# Patient Record
Sex: Female | Born: 1935 | Race: White | Hispanic: No | Smoking: Never smoker
Health system: Southern US, Community
[De-identification: ages and names within clinical notes are randomized; demographics above are authoritative.]

## PROBLEM LIST (undated history)

## (undated) DIAGNOSIS — E7800 Pure hypercholesterolemia, unspecified: Secondary | ICD-10-CM

## (undated) DIAGNOSIS — K219 Gastro-esophageal reflux disease without esophagitis: Secondary | ICD-10-CM

## (undated) DIAGNOSIS — F419 Anxiety disorder, unspecified: Secondary | ICD-10-CM

## (undated) DIAGNOSIS — J300 Vasomotor rhinitis: Secondary | ICD-10-CM

## (undated) DIAGNOSIS — C4491 Basal cell carcinoma of skin, unspecified: Secondary | ICD-10-CM

## (undated) DIAGNOSIS — N10 Acute tubulo-interstitial nephritis: Secondary | ICD-10-CM

## (undated) DIAGNOSIS — K759 Inflammatory liver disease, unspecified: Secondary | ICD-10-CM

## (undated) DIAGNOSIS — H9190 Unspecified hearing loss, unspecified ear: Secondary | ICD-10-CM

## (undated) DIAGNOSIS — D649 Anemia, unspecified: Secondary | ICD-10-CM

## (undated) DIAGNOSIS — F329 Major depressive disorder, single episode, unspecified: Secondary | ICD-10-CM

## (undated) DIAGNOSIS — M1990 Unspecified osteoarthritis, unspecified site: Secondary | ICD-10-CM

## (undated) DIAGNOSIS — M810 Age-related osteoporosis without current pathological fracture: Secondary | ICD-10-CM

## (undated) DIAGNOSIS — F32A Depression, unspecified: Secondary | ICD-10-CM

## (undated) DIAGNOSIS — Z87442 Personal history of urinary calculi: Secondary | ICD-10-CM

## (undated) HISTORY — PX: DILATION AND CURETTAGE OF UTERUS: SHX78

## (undated) HISTORY — PX: CATARACT EXTRACTION, BILATERAL: SHX1313

## (undated) HISTORY — DX: Pure hypercholesterolemia, unspecified: E78.00

## (undated) HISTORY — PX: RETINAL DETACHMENT SURGERY: SHX105

## (undated) HISTORY — PX: COLONOSCOPY: SHX174

## (undated) HISTORY — DX: Age-related osteoporosis without current pathological fracture: M81.0

## (undated) HISTORY — DX: Anemia, unspecified: D64.9

## (undated) HISTORY — DX: Vasomotor rhinitis: J30.0

## (undated) HISTORY — DX: Basal cell carcinoma of skin, unspecified: C44.91

## (undated) HISTORY — PX: BASAL CELL CARCINOMA EXCISION: SHX1214

## (undated) HISTORY — PX: LITHOTRIPSY: SUR834

## (undated) HISTORY — PX: URETERAL STENT PLACEMENT: SHX822

---

## 1966-11-04 HISTORY — PX: ABDOMINAL HYSTERECTOMY: SHX81

## 2004-10-30 ENCOUNTER — Ambulatory Visit: Payer: Self-pay | Admitting: Internal Medicine

## 2005-11-12 ENCOUNTER — Ambulatory Visit: Payer: Self-pay | Admitting: Internal Medicine

## 2006-11-13 ENCOUNTER — Ambulatory Visit: Payer: Self-pay | Admitting: Internal Medicine

## 2007-12-01 ENCOUNTER — Ambulatory Visit: Payer: Self-pay | Admitting: Internal Medicine

## 2008-12-29 ENCOUNTER — Ambulatory Visit: Payer: Self-pay | Admitting: Internal Medicine

## 2010-01-01 ENCOUNTER — Ambulatory Visit: Payer: Self-pay | Admitting: Internal Medicine

## 2011-06-26 ENCOUNTER — Ambulatory Visit: Payer: Self-pay | Admitting: Internal Medicine

## 2011-11-12 DIAGNOSIS — E559 Vitamin D deficiency, unspecified: Secondary | ICD-10-CM | POA: Diagnosis not present

## 2011-11-12 DIAGNOSIS — E7800 Pure hypercholesterolemia, unspecified: Secondary | ICD-10-CM | POA: Diagnosis not present

## 2011-11-12 DIAGNOSIS — Z79899 Other long term (current) drug therapy: Secondary | ICD-10-CM | POA: Diagnosis not present

## 2011-11-27 DIAGNOSIS — E7800 Pure hypercholesterolemia, unspecified: Secondary | ICD-10-CM | POA: Diagnosis not present

## 2011-11-27 DIAGNOSIS — R5381 Other malaise: Secondary | ICD-10-CM | POA: Diagnosis not present

## 2011-11-27 DIAGNOSIS — J019 Acute sinusitis, unspecified: Secondary | ICD-10-CM | POA: Diagnosis not present

## 2011-11-27 DIAGNOSIS — J309 Allergic rhinitis, unspecified: Secondary | ICD-10-CM | POA: Diagnosis not present

## 2011-11-27 DIAGNOSIS — R5383 Other fatigue: Secondary | ICD-10-CM | POA: Diagnosis not present

## 2012-01-16 DIAGNOSIS — R5381 Other malaise: Secondary | ICD-10-CM | POA: Diagnosis not present

## 2012-01-16 DIAGNOSIS — J309 Allergic rhinitis, unspecified: Secondary | ICD-10-CM | POA: Diagnosis not present

## 2012-01-16 DIAGNOSIS — E7800 Pure hypercholesterolemia, unspecified: Secondary | ICD-10-CM | POA: Diagnosis not present

## 2012-03-31 DIAGNOSIS — H02059 Trichiasis without entropian unspecified eye, unspecified eyelid: Secondary | ICD-10-CM | POA: Diagnosis not present

## 2012-03-31 DIAGNOSIS — E7800 Pure hypercholesterolemia, unspecified: Secondary | ICD-10-CM | POA: Diagnosis not present

## 2012-03-31 DIAGNOSIS — R5381 Other malaise: Secondary | ICD-10-CM | POA: Diagnosis not present

## 2012-04-06 DIAGNOSIS — E7800 Pure hypercholesterolemia, unspecified: Secondary | ICD-10-CM | POA: Diagnosis not present

## 2012-04-06 DIAGNOSIS — R42 Dizziness and giddiness: Secondary | ICD-10-CM | POA: Diagnosis not present

## 2012-04-06 DIAGNOSIS — R5383 Other fatigue: Secondary | ICD-10-CM | POA: Diagnosis not present

## 2012-04-06 DIAGNOSIS — J309 Allergic rhinitis, unspecified: Secondary | ICD-10-CM | POA: Diagnosis not present

## 2012-05-13 DIAGNOSIS — Z1211 Encounter for screening for malignant neoplasm of colon: Secondary | ICD-10-CM | POA: Diagnosis not present

## 2012-06-29 ENCOUNTER — Ambulatory Visit: Payer: Self-pay | Admitting: Unknown Physician Specialty

## 2012-06-29 DIAGNOSIS — Z1231 Encounter for screening mammogram for malignant neoplasm of breast: Secondary | ICD-10-CM | POA: Diagnosis not present

## 2012-07-04 DIAGNOSIS — B338 Other specified viral diseases: Secondary | ICD-10-CM | POA: Diagnosis not present

## 2012-07-04 DIAGNOSIS — R300 Dysuria: Secondary | ICD-10-CM | POA: Diagnosis not present

## 2012-07-04 DIAGNOSIS — R110 Nausea: Secondary | ICD-10-CM | POA: Diagnosis not present

## 2012-07-04 DIAGNOSIS — R350 Frequency of micturition: Secondary | ICD-10-CM | POA: Diagnosis not present

## 2012-07-04 DIAGNOSIS — N390 Urinary tract infection, site not specified: Secondary | ICD-10-CM | POA: Diagnosis not present

## 2012-08-08 DIAGNOSIS — Z23 Encounter for immunization: Secondary | ICD-10-CM | POA: Diagnosis not present

## 2012-09-29 ENCOUNTER — Ambulatory Visit (INDEPENDENT_AMBULATORY_CARE_PROVIDER_SITE_OTHER): Payer: Medicare Other | Admitting: Internal Medicine

## 2012-09-29 ENCOUNTER — Encounter: Payer: Self-pay | Admitting: Internal Medicine

## 2012-09-29 VITALS — BP 126/76 | HR 70 | Temp 98.1°F | Ht 65.0 in | Wt 140.0 lb

## 2012-09-29 DIAGNOSIS — M810 Age-related osteoporosis without current pathological fracture: Secondary | ICD-10-CM | POA: Diagnosis not present

## 2012-09-29 DIAGNOSIS — K219 Gastro-esophageal reflux disease without esophagitis: Secondary | ICD-10-CM | POA: Diagnosis not present

## 2012-09-29 DIAGNOSIS — E7800 Pure hypercholesterolemia, unspecified: Secondary | ICD-10-CM | POA: Diagnosis not present

## 2012-09-29 MED ORDER — FLUTICASONE PROPIONATE 50 MCG/ACT NA SUSP: 2.0000 | Freq: Every day | NASAL | Status: DC

## 2012-09-29 MED ORDER — LEVOCETIRIZINE DIHYDROCHLORIDE 5 MG PO TABS: 5.0000 mg | ORAL_TABLET | Freq: Every evening | ORAL | Status: DC

## 2012-09-29 NOTE — Progress Notes (Signed)
Subjective:    Patient ID: Sabrina Cervantes, female    DOB: November 19, 1928, 78 y.o.   MRN: [id number]  HPI 78 year old female with past history of nephrolithiasis and hypercholesterolemia who comes in today for a scheduled follow up.  States she is overall doing relatively well.  She is having some issues with a runny nose.  Has tried claritin, zyrtec, allegra and steroid nasal spray.  She wants to try Xyzal.  She was having problems with her left knee and left hip.  Took tylenol.  This resolved.  Eating and drinking well.  Bowels stable.    Past Medical History  Diagnosis Date  . Hypercholesterolemia   . Osteoporosis   . Basal cell carcinoma   . Anemia   . Vasomotor rhinitis     chronic  . Nephrolithiasis     pyelonephritis, s/p ureteral stent    Outpatient Encounter Prescriptions as of 09/29/2012  Medication Sig Dispense Refill  . Calcium Carbonate-Vitamin D (CALCIUM 600+D) 600-400 MG-UNIT per tablet Take 1 tablet by mouth 2 (two) times daily.      . Cholecalciferol (VITAMIN D-3) 1000 UNITS CAPS Take 1 capsule by mouth daily.      Marland Kitchen CRANBERRY PO Take 1 tablet by mouth daily.      . fexofenadine (ALLEGRA) 180 MG tablet Take 180 mg by mouth daily.      . fish oil-omega-3 fatty acids 1000 MG capsule Take 1 g by mouth daily.      . fluticasone (FLONASE) 50 MCG/ACT nasal spray Place 2 sprays into the nose daily.  16 g  3  . levocetirizine (XYZAL) 5 MG tablet Take 1 tablet (5 mg total) by mouth every evening.  30 tablet  3  . pantoprazole (PROTONIX) 40 MG tablet Take 40 mg by mouth daily.      . simvastatin (ZOCOR) 40 MG tablet Take 40 mg by mouth every evening.      . [DISCONTINUED] fluticasone (FLONASE) 50 MCG/ACT nasal spray Place 2 sprays into the nose daily.      . [DISCONTINUED] levocetirizine (XYZAL) 5 MG tablet Take 5 mg by mouth every evening.      Marland Kitchen ALPRAZolam (XANAX) 0.25 MG tablet Take 0.25 mg by mouth daily as needed.      Marland Kitchen escitalopram (LEXAPRO) 10 MG tablet Take 10 mg by mouth  daily.        Review of Systems Patient denies any headache, lightheadedness or dizziness.  Some runny nose.  No sinus pressure.  No chest pain, tightness or palpitations.  No increased shortness of breath, cough or congestion.  No nausea or vomiting.  No abdominal pain or cramping.  No bowel change, such as diarrhea, constipation, BRBPR or melana.  No urine change.        Objective:   Physical Exam Filed Vitals:   09/29/12 1507  BP: 126/76  Pulse: 70  Temp: 98.1 F (77.59 C)   78 year old female in no acute distress.   HEENT:  Nares - clear.  OP- without lesions or erythema.  NECK:  Supple, nontender.  No audible bruit.   HEART:  Appears to be regular. LUNGS:  Without crackles or wheezing audible.  Respirations even and unlabored.   RADIAL PULSE:  Equal bilaterally.  ABDOMEN:  Soft, nontender.  No audible abdominal bruit.   EXTREMITIES:  No increased edema to be present.  Assessment & Plan:  INCREASED PSYCHOSOCIAL STRESSORS.  Did not tolerate Prozac.  Trazodone did not help her sleep.  Had intolerance to Zoloft and Lexapro.  Doing well now.  Follow.    ALLERGIES.  Xyzal.  Flonase as directed.  Follow.     CARDIOVASCULAR.  ECHO 09/25/11 revealed normal heart function with no significant valve abnormality.  Currently asymptomatic.  Follow.    GI.  Declines colonsocopy.  Currently asymptomatic.    HEALTH MAINTENANCE.  Physical 04/06/12.  She is s/p hysterectomy and does not require yearly pap smears.  Pneumovax 10/10/05.  Mammogram 06/26/11 - BiRADS II.  Need to obtain results of last mammogram.  Received flu shot - 10/13.

## 2012-09-29 NOTE — Patient Instructions (Signed)
It was good seeing you today.  I am glad you are feeling better.  I have sent a prescription for Xyzal (antihistamine) for the drainage.  Continue Flonase.  Let me know if problems.

## 2012-10-01 ENCOUNTER — Encounter: Payer: Self-pay | Admitting: Internal Medicine

## 2012-10-01 NOTE — Assessment & Plan Note (Signed)
Desires not to take a bisphosphonate.  Continue calcium and vitamin D.  Follow.    

## 2012-10-01 NOTE — Assessment & Plan Note (Signed)
Symptoms controlled on Protonix.  Follow.    

## 2012-10-01 NOTE — Assessment & Plan Note (Signed)
On Zocor.  Low cholesterol diet and exercise.  Check lipid panel and liver function with next fasting labs.     

## 2012-10-13 DIAGNOSIS — H02059 Trichiasis without entropian unspecified eye, unspecified eyelid: Secondary | ICD-10-CM | POA: Diagnosis not present

## 2013-01-25 ENCOUNTER — Other Ambulatory Visit: Payer: Self-pay | Admitting: General Practice

## 2013-01-25 MED ORDER — SIMVASTATIN 40 MG PO TABS: 40.0000 mg | ORAL_TABLET | Freq: Every evening | ORAL | Status: DC

## 2013-01-25 MED ORDER — PANTOPRAZOLE SODIUM 40 MG PO TBEC: 40.0000 mg | DELAYED_RELEASE_TABLET | Freq: Every day | ORAL | Status: DC

## 2013-01-25 NOTE — Telephone Encounter (Signed)
Med filled.  

## 2013-01-26 ENCOUNTER — Ambulatory Visit: Payer: Medicare Other | Admitting: Internal Medicine

## 2013-03-04 ENCOUNTER — Encounter: Payer: Self-pay | Admitting: Internal Medicine

## 2013-03-04 ENCOUNTER — Ambulatory Visit (INDEPENDENT_AMBULATORY_CARE_PROVIDER_SITE_OTHER): Payer: Medicare Other | Admitting: Internal Medicine

## 2013-03-04 VITALS — BP 110/64 | HR 74 | Temp 98.3°F | Ht 65.0 in | Wt 143.0 lb

## 2013-03-04 DIAGNOSIS — M810 Age-related osteoporosis without current pathological fracture: Secondary | ICD-10-CM

## 2013-03-04 DIAGNOSIS — K219 Gastro-esophageal reflux disease without esophagitis: Secondary | ICD-10-CM

## 2013-03-04 DIAGNOSIS — E7800 Pure hypercholesterolemia, unspecified: Secondary | ICD-10-CM

## 2013-03-04 MED ORDER — ALPRAZOLAM 0.25 MG PO TABS: 0.2500 mg | ORAL_TABLET | Freq: Every day | ORAL | Status: DC | PRN

## 2013-03-04 MED ORDER — FLUTICASONE PROPIONATE 50 MCG/ACT NA SUSP: 2.0000 | Freq: Every day | NASAL | Status: DC

## 2013-03-04 MED ORDER — AZELASTINE HCL 0.1 % NA SOLN: 1.0000 | Freq: Two times a day (BID) | NASAL | Status: DC

## 2013-03-07 ENCOUNTER — Encounter: Payer: Self-pay | Admitting: Internal Medicine

## 2013-03-07 NOTE — Assessment & Plan Note (Signed)
Desires not to take a bisphosphonate.  Continue calcium and vitamin D.  Follow.    

## 2013-03-07 NOTE — Assessment & Plan Note (Signed)
On Zocor.  Low cholesterol diet and exercise.  Check lipid panel and liver function with next fasting labs.

## 2013-03-07 NOTE — Assessment & Plan Note (Signed)
Symptoms controlled on Protonix.  Follow.    

## 2013-03-07 NOTE — Progress Notes (Signed)
Subjective:    Patient ID: Sabrina Cervantes, female    DOB: 1929/01/20, 79 y.o.   MRN: [id number]  HPI 79 year old female with past history of nephrolithiasis and hypercholesterolemia who comes in today for a scheduled follow up.  States she is overall doing relatively well.  She is having some issues with a runny nose.  Has tried claritin, zyrtec, allegra and steroid nasal spray.  Last visit, she tried xyzal.  She continues to have persistent problems.  States when her allergies flare, she will also notice being a little dizzy.  We have discussed this at length previously and again discussed this today.  discussed that she is continuing to have persistent problems despite multiple medication regimens.  I want to refer her back to ENT.  She declines.  Discussed dizziness.  She declines any further w/up for this.  Discussed trying astelin.   Eating and drinking well.  Bowels stable.  No chest pain or tightness.  She does take an occasional xanax.      Past Medical History  Diagnosis Date  . Hypercholesterolemia   . Osteoporosis   . Basal cell carcinoma   . Anemia   . Vasomotor rhinitis     chronic  . Nephrolithiasis     pyelonephritis, s/p ureteral stent    Outpatient Encounter Prescriptions as of 03/04/2013  Medication Sig Dispense Refill  . ALPRAZolam (XANAX) 0.25 MG tablet Take 1 tablet (0.25 mg total) by mouth daily as needed.  30 tablet  0  . Calcium Carbonate-Vitamin D (CALCIUM 600+D) 600-400 MG-UNIT per tablet Take 1 tablet by mouth 2 (two) times daily.      . Cholecalciferol (VITAMIN D-3) 1000 UNITS CAPS Take 1 capsule by mouth daily.      Marland Kitchen CRANBERRY PO Take 1 tablet by mouth daily.      . fish oil-omega-3 fatty acids 1000 MG capsule Take 1 g by mouth daily.      . fluticasone (FLONASE) 50 MCG/ACT nasal spray Place 2 sprays into the nose daily.  16 g  3  . pantoprazole (PROTONIX) 40 MG tablet Take 1 tablet (40 mg total) by mouth daily.  90 tablet  1  . simvastatin (ZOCOR) 40 MG tablet  Take 1 tablet (40 mg total) by mouth every evening.  90 tablet  1  . [DISCONTINUED] ALPRAZolam (XANAX) 0.25 MG tablet Take 0.25 mg by mouth daily as needed.      . [DISCONTINUED] fluticasone (FLONASE) 50 MCG/ACT nasal spray Place 2 sprays into the nose daily.  16 g  3  . azelastine (ASTELIN) 137 MCG/SPRAY nasal spray Place 1 spray into the nose 2 (two) times daily. Use in each nostril as directed  30 mL  1  . escitalopram (LEXAPRO) 10 MG tablet Take 10 mg by mouth daily.      . fexofenadine (ALLEGRA) 180 MG tablet Take 180 mg by mouth daily.      Marland Kitchen levocetirizine (XYZAL) 5 MG tablet Take 1 tablet (5 mg total) by mouth every evening.  30 tablet  3   No facility-administered encounter medications on file as of 03/04/2013.    Review of Systems Patient denies any headache now.  Some intermittent dizziness which she states is worse when her allergies are worse.  Some runny nose.  No sinus pressure.  Sore throat previously.  None now.  No chest pain, tightness or palpitations.  No increased shortness of breath, cough or congestion.  No nausea or vomiting.  No acid  reflux.  No abdominal pain or cramping.  No bowel change, such as diarrhea, constipation, BRBPR or melana.  No urine change.   Handling stress relatively well.  Does take an occasional xanax.      Objective:   Physical Exam  Filed Vitals:   03/04/13 1520  BP: 110/64  Pulse: 74  Temp: 98.3 F (56.27 C)   79 year old female in no acute distress.   HEENT:  Nares - clear.  OP- without lesions or erythema.  TMs visualized without erythema.  No significant tenderness to palpation over the sinuses.  NECK:  Supple, nontender.  No audible bruit.   HEART:  Appears to be regular. LUNGS:  Without crackles or wheezing audible.  Respirations even and unlabored.   RADIAL PULSE:  Equal bilaterally.  ABDOMEN:  Soft, nontender.  No audible abdominal bruit.   EXTREMITIES:  No increased edema to be present.                     Assessment & Plan:   INCREASED PSYCHOSOCIAL STRESSORS.  Did not tolerate Prozac.  Trazodone did not help her sleep.  Had intolerance to Zoloft and Lexapro.  Doing well now.  Follow.  Takes an occasional xanax.  Rarely takes.   ALLERGIES.  Flonase as directed.  Will try astelin nasal spray.  Follow.    DIZZINESS.  Intermittent.  She feels it occurs when her allergies are flared. Declines further evaluation or w/up.  Declines ENT referral.   CARDIOVASCULAR.  ECHO 09/25/11 revealed normal heart function with no significant valve abnormality.  Currently asymptomatic.  Follow.    GI.  Declines colonsocopy.  Currently asymptomatic.    HEALTH MAINTENANCE.  Physical 04/06/12.  She is s/p hysterectomy and does not require yearly pap smears.  Pneumovax 10/10/05.  Mammogram 06/26/11 - BiRADS II.  Need to obtain results of last mammogram.  Still do not have.

## 2013-07-13 ENCOUNTER — Ambulatory Visit: Payer: Self-pay | Admitting: Internal Medicine

## 2013-07-13 ENCOUNTER — Other Ambulatory Visit (INDEPENDENT_AMBULATORY_CARE_PROVIDER_SITE_OTHER): Payer: Medicare Other

## 2013-07-13 DIAGNOSIS — E7800 Pure hypercholesterolemia, unspecified: Secondary | ICD-10-CM

## 2013-07-13 DIAGNOSIS — Z1231 Encounter for screening mammogram for malignant neoplasm of breast: Secondary | ICD-10-CM | POA: Diagnosis not present

## 2013-07-13 LAB — LDL CHOLESTEROL, DIRECT: Direct LDL: 109.2 mg/dL

## 2013-07-13 LAB — LIPID PANEL: HDL: 44.9 mg/dL (ref >39.00)

## 2013-07-13 LAB — HEPATIC FUNCTION PANEL
ALT: 10 U/L (ref 0–35)
AST: 17 U/L (ref 0–37)
Bilirubin, Direct: 0 mg/dL (ref 0.0–0.3)
Total Protein: 6.5 g/dL (ref 6.0–8.3)

## 2013-07-19 ENCOUNTER — Ambulatory Visit (INDEPENDENT_AMBULATORY_CARE_PROVIDER_SITE_OTHER): Payer: Medicare Other | Admitting: Internal Medicine

## 2013-07-19 ENCOUNTER — Encounter: Payer: Self-pay | Admitting: Internal Medicine

## 2013-07-19 VITALS — BP 120/62 | HR 73 | Temp 98.2°F | Ht 62.5 in | Wt 140.8 lb

## 2013-07-19 DIAGNOSIS — M810 Age-related osteoporosis without current pathological fracture: Secondary | ICD-10-CM

## 2013-07-19 DIAGNOSIS — Z0000 Encounter for general adult medical examination without abnormal findings: Secondary | ICD-10-CM | POA: Diagnosis not present

## 2013-07-19 DIAGNOSIS — L989 Disorder of the skin and subcutaneous tissue, unspecified: Secondary | ICD-10-CM

## 2013-07-19 DIAGNOSIS — Z23 Encounter for immunization: Secondary | ICD-10-CM | POA: Diagnosis not present

## 2013-07-19 DIAGNOSIS — K219 Gastro-esophageal reflux disease without esophagitis: Secondary | ICD-10-CM

## 2013-07-19 DIAGNOSIS — E7800 Pure hypercholesterolemia, unspecified: Secondary | ICD-10-CM | POA: Diagnosis not present

## 2013-07-21 ENCOUNTER — Encounter: Payer: Self-pay | Admitting: Internal Medicine

## 2013-07-21 DIAGNOSIS — Z79899 Other long term (current) drug therapy: Secondary | ICD-10-CM | POA: Diagnosis not present

## 2013-07-21 DIAGNOSIS — L989 Disorder of the skin and subcutaneous tissue, unspecified: Secondary | ICD-10-CM | POA: Insufficient documentation

## 2013-07-21 NOTE — Progress Notes (Signed)
Subjective:    Patient ID: Sabrina Cervantes, female    DOB: 1929/01/18, 79 y.o.   MRN: [id number]  HPI 79 year old female with past history of nephrolithiasis and hypercholesterolemia.  The patient is here for annual Medicare wellness examination and management of other chronic and acute problems.   The risk factors are reflected in the social history.  The roster of all physicians providing medical care to patient - is listed in the Snapshot section of the chart.  Activities of daily living:  The patient is 100% independent in all ADLs: dressing, toileting, feeding as well as independent mobility.  She does not drive.    Home safety :  Wears seatbelts.  There is no violence in the home.   There is no risks for hepatitis, STDs or HIV. There is no history of blood transfusion. They have no travel history to infectious disease endemic areas of the world.  They admit to slight hearing difficulty in a noisy room.  They do not  have excessive sun exposure.   Diet: the importance of a healthy diet is discussed. They do have a healthy diet.  The benefits of regular aerobic exercise were discussed.   Depression screen: there are no signs or vegative symptoms of depression- irritability, change in appetite, anhedonia, sadness/tearfullness.  Cognitive assessment: the patient manages all their financial and personal affairs and is actively engaged. They could relate day,year and events; recalled 3/3 objects at 3 minutes.   The following portions of the patient's history were reviewed and updated as appropriate: allergies, current medications, past family history, past medical history,  past surgical history, past social history  and problem list.    Body mass index were assessed and reviewed.   During the course of the visit the patient was educated and counseled about appropriate screening and preventive services including : fall prevention, nutrition counseling, colorectal cancer screening, and  recommended immunizations.     States she is overall doing relatively well.  She is having some issues with a runny nose.  Has tried claritin, zyrtec, allegra and steroid nasal spray.  Last visit, she tried xyzal.  She continues to have persistent problems.  She has declined ENT evaluation.   Eating and drinking well.  Bowels stable.  No chest pain or tightness. Does report some sob with exertion.  She feels unchanged.  She does take an occasional xanax.  Persistent leg lesion.      Past Medical History  Diagnosis Date  . Hypercholesterolemia   . Osteoporosis   . Basal cell carcinoma   . Anemia   . Vasomotor rhinitis     chronic  . Nephrolithiasis     pyelonephritis, s/p ureteral stent    Outpatient Encounter Prescriptions as of 07/19/2013  Medication Sig Dispense Refill  . ALPRAZolam (XANAX) 0.25 MG tablet Take 1 tablet (0.25 mg total) by mouth daily as needed.  30 tablet  0  . azelastine (ASTELIN) 137 MCG/SPRAY nasal spray Place 1 spray into the nose 2 (two) times daily. Use in each nostril as directed  30 mL  1  . Calcium Carbonate-Vitamin D (CALCIUM 600+D) 600-400 MG-UNIT per tablet Take 1 tablet by mouth 2 (two) times daily.      . Cholecalciferol (VITAMIN D-3) 1000 UNITS CAPS Take 1 capsule by mouth daily.      Marland Kitchen CRANBERRY PO Take 1 tablet by mouth daily.      Marland Kitchen escitalopram (LEXAPRO) 10 MG tablet Take 10 mg by mouth daily.      Marland Kitchen  fexofenadine (ALLEGRA) 180 MG tablet Take 180 mg by mouth daily.      . fish oil-omega-3 fatty acids 1000 MG capsule Take 1 g by mouth daily.      . fluticasone (FLONASE) 50 MCG/ACT nasal spray Place 2 sprays into the nose daily.  16 g  3  . levocetirizine (XYZAL) 5 MG tablet Take 1 tablet (5 mg total) by mouth every evening.  30 tablet  3  . pantoprazole (PROTONIX) 40 MG tablet Take 1 tablet (40 mg total) by mouth daily.  90 tablet  1  . simvastatin (ZOCOR) 40 MG tablet Take 1 tablet (40 mg total) by mouth every evening.  90 tablet  1   No  facility-administered encounter medications on file as of 07/19/2013.    Review of Systems Patient denies any headache now.  Some intermittent dizziness which she states is worse when her allergies are worse.  Some runny nose.  No sinus pressure.  Sore throat previously.  None now.  No chest pain, tightness or palpitations.  No increased shortness of breath, cough or congestion.  No nausea or vomiting.  No acid reflux.  No abdominal pain or cramping.  No bowel change, such as diarrhea, constipation, BRBPR or melana.  No urine change.   Handling stress relatively well.  Does take an occasional xanax.      Objective:   Physical Exam  Filed Vitals:   07/19/13 1331  BP: 120/62  Pulse: 73  Temp: 98.2 F (77.69 C)   79 year old female in no acute distress.   HEENT:  Nares- clear.  Oropharynx - without lesions. NECK:  Supple.  Nontender.  No audible bruit.  HEART:  Appears to be regular. LUNGS:  No crackles or wheezing audible.  Respirations even and unlabored.  RADIAL PULSE:  Equal bilaterally.    BREASTS:  No nipple discharge or nipple retraction present.  Could not appreciate any distinct nodules or axillary adenopathy.  ABDOMEN:  Soft, nontender.  Bowel sounds present and normal.  No audible abdominal bruit.  GU: not performed.    EXTREMITIES:  No increased edema present.  DP pulses palpable and equal bilaterally.  SKIN:  Persistent left leg lesion.  Persistent mole - back.                  Assessment & Plan:  INCREASED PSYCHOSOCIAL STRESSORS.  Did not tolerate Prozac.  Trazodone did not help her sleep.  Had intolerance to Zoloft and Lexapro.  Doing well now.  Follow.  Takes an occasional xanax.  Rarely takes.   DIZZINESS.  Did not report today.  Has declined further evaluation or w/up.  Has declined ENT referral.   CARDIOVASCULAR.  ECHO 09/25/11 revealed normal heart function with no significant valve abnormality.  EKG revealed SR with nonspecific ST/T changes.  Discussed further  cardiac w/up especially given the sob with exertion.  She declines.  Will notify me if she changes her mind.     GI.  Declines colonsocopy.  Currently asymptomatic.   S/P FALL.  States she tripped.  Had a black eye.  Healed now.  Currently doing well.  Follow.    HEALTH MAINTENANCE.  Physical today.  She is s/p hysterectomy and does not require yearly pap smears.  Pneumovax 10/10/05.  Mammogram - last week.  Pt states everything checked out fine.  Need results.

## 2013-07-21 NOTE — Assessment & Plan Note (Signed)
Desires not to take a bisphosphonate.  Continue calcium and vitamin D.  Follow.    

## 2013-07-21 NOTE — Assessment & Plan Note (Signed)
On Zocor.  Low cholesterol diet and exercise.  Follow lipid panel and liver function.      

## 2013-07-21 NOTE — Assessment & Plan Note (Signed)
Refer to dermatology for evaluation. 

## 2013-07-21 NOTE — Assessment & Plan Note (Signed)
Symptoms controlled on Protonix.  Follow.    

## 2013-07-23 ENCOUNTER — Encounter: Payer: Self-pay | Admitting: Internal Medicine

## 2013-07-27 ENCOUNTER — Other Ambulatory Visit: Payer: Self-pay | Admitting: *Deleted

## 2013-07-27 MED ORDER — SIMVASTATIN 40 MG PO TABS: 40.0000 mg | ORAL_TABLET | Freq: Every evening | ORAL | Status: DC

## 2013-07-27 MED ORDER — ALPRAZOLAM 0.25 MG PO TABS: 0.2500 mg | ORAL_TABLET | Freq: Every day | ORAL | Status: DC | PRN

## 2013-07-27 MED ORDER — PANTOPRAZOLE SODIUM 40 MG PO TBEC: 40.0000 mg | DELAYED_RELEASE_TABLET | Freq: Every day | ORAL | Status: DC

## 2013-08-09 ENCOUNTER — Encounter: Payer: Self-pay | Admitting: Internal Medicine

## 2013-08-25 DIAGNOSIS — D485 Neoplasm of uncertain behavior of skin: Secondary | ICD-10-CM | POA: Diagnosis not present

## 2013-08-25 DIAGNOSIS — L905 Scar conditions and fibrosis of skin: Secondary | ICD-10-CM | POA: Diagnosis not present

## 2013-08-25 DIAGNOSIS — L821 Other seborrheic keratosis: Secondary | ICD-10-CM | POA: Diagnosis not present

## 2013-08-25 DIAGNOSIS — L819 Disorder of pigmentation, unspecified: Secondary | ICD-10-CM | POA: Diagnosis not present

## 2013-08-25 DIAGNOSIS — D1801 Hemangioma of skin and subcutaneous tissue: Secondary | ICD-10-CM | POA: Diagnosis not present

## 2013-08-25 DIAGNOSIS — L570 Actinic keratosis: Secondary | ICD-10-CM | POA: Diagnosis not present

## 2013-08-25 DIAGNOSIS — Z0189 Encounter for other specified special examinations: Secondary | ICD-10-CM | POA: Diagnosis not present

## 2013-09-22 DIAGNOSIS — H40009 Preglaucoma, unspecified, unspecified eye: Secondary | ICD-10-CM | POA: Diagnosis not present

## 2013-10-13 DIAGNOSIS — D1801 Hemangioma of skin and subcutaneous tissue: Secondary | ICD-10-CM | POA: Diagnosis not present

## 2013-10-13 DIAGNOSIS — L905 Scar conditions and fibrosis of skin: Secondary | ICD-10-CM | POA: Diagnosis not present

## 2013-10-13 DIAGNOSIS — L570 Actinic keratosis: Secondary | ICD-10-CM | POA: Diagnosis not present

## 2013-10-20 ENCOUNTER — Telehealth: Payer: Self-pay | Admitting: Internal Medicine

## 2013-10-20 MED ORDER — ALPRAZOLAM 0.25 MG PO TABS: 0.2500 mg | ORAL_TABLET | Freq: Every day | ORAL | Status: DC | PRN

## 2013-10-20 NOTE — Telephone Encounter (Signed)
ALPRAZolam (XANAX) 0.25 MG tablet

## 2013-10-20 NOTE — Telephone Encounter (Signed)
Okay to refill? 

## 2013-10-20 NOTE — Telephone Encounter (Signed)
Refilled xanax #30 with no refills.   

## 2013-11-02 ENCOUNTER — Telehealth: Payer: Self-pay | Admitting: Internal Medicine

## 2013-11-02 NOTE — Telephone Encounter (Signed)
pantoprazole (PROTONIX) 40 MG tablet  #90

## 2013-11-03 NOTE — Telephone Encounter (Signed)
Already has a refill on file

## 2013-11-08 DIAGNOSIS — H40009 Preglaucoma, unspecified, unspecified eye: Secondary | ICD-10-CM | POA: Diagnosis not present

## 2014-01-05 ENCOUNTER — Other Ambulatory Visit: Payer: Self-pay | Admitting: Internal Medicine

## 2014-01-18 ENCOUNTER — Encounter: Payer: Self-pay | Admitting: Internal Medicine

## 2014-01-18 ENCOUNTER — Ambulatory Visit (INDEPENDENT_AMBULATORY_CARE_PROVIDER_SITE_OTHER): Payer: Medicare Other | Admitting: Internal Medicine

## 2014-01-18 VITALS — BP 120/70 | HR 74 | Temp 98.3°F | Ht 62.5 in | Wt 143.2 lb

## 2014-01-18 DIAGNOSIS — Z23 Encounter for immunization: Secondary | ICD-10-CM | POA: Diagnosis not present

## 2014-01-18 DIAGNOSIS — E7800 Pure hypercholesterolemia, unspecified: Secondary | ICD-10-CM

## 2014-01-18 DIAGNOSIS — M810 Age-related osteoporosis without current pathological fracture: Secondary | ICD-10-CM | POA: Diagnosis not present

## 2014-01-18 DIAGNOSIS — Z1211 Encounter for screening for malignant neoplasm of colon: Secondary | ICD-10-CM

## 2014-01-18 DIAGNOSIS — K5900 Constipation, unspecified: Secondary | ICD-10-CM

## 2014-01-18 DIAGNOSIS — K219 Gastro-esophageal reflux disease without esophagitis: Secondary | ICD-10-CM

## 2014-01-18 MED ORDER — FLUTICASONE PROPIONATE 50 MCG/ACT NA SUSP: 2.0000 | Freq: Every day | NASAL | Status: DC

## 2014-01-18 MED ORDER — ALPRAZOLAM 0.25 MG PO TABS: 0.2500 mg | ORAL_TABLET | Freq: Every day | ORAL | Status: DC | PRN

## 2014-01-18 NOTE — Progress Notes (Signed)
Pre-visit discussion using our clinic review tool. No additional management support is needed unless otherwise documented below in the visit note.  

## 2014-01-23 ENCOUNTER — Encounter: Payer: Self-pay | Admitting: Internal Medicine

## 2014-01-23 DIAGNOSIS — K5900 Constipation, unspecified: Secondary | ICD-10-CM | POA: Insufficient documentation

## 2014-01-23 NOTE — Assessment & Plan Note (Signed)
Start miralax.  Follow.

## 2014-01-23 NOTE — Progress Notes (Signed)
Subjective:    Patient ID: Sabrina Cervantes, female    DOB: [birth year], 80 y.o.   MRN: [id number]  HPI 80 year old female with past history of nephrolithiasis and hypercholesterolemia who comes in today for a scheduled follow up.  States she is overall doing relatively well.  She is having some issues with a runny nose.  Has tried claritin, zyrtec, allegra and steroid nasal spray.  Has also tried xyzal.  She continues to have persistent problems.   I want to refer her back to ENT.  She declines.  Eating and drinking well.  No chest pain or tightness.  She does take an occasional xanax.  Request a refill.  She has had some issues with constipation over the last two weeks.  Taking stool softeners now.  Has helped.  We discussed miralax.      Past Medical History  Diagnosis Date  . Hypercholesterolemia   . Osteoporosis   . Basal cell carcinoma   . Anemia   . Vasomotor rhinitis     chronic  . Nephrolithiasis     pyelonephritis, s/p ureteral stent    Outpatient Encounter Prescriptions as of 01/18/2014  Medication Sig  . ALPRAZolam (XANAX) 0.25 MG tablet Take 1 tablet (0.25 mg total) by mouth daily as needed.  Marland Kitchen azelastine (ASTELIN) 137 MCG/SPRAY nasal spray Place 1 spray into the nose 2 (two) times daily. Use in each nostril as directed  . Calcium Carbonate-Vitamin D (CALCIUM 600+D) 600-400 MG-UNIT per tablet Take 1 tablet by mouth 2 (two) times daily.  . Cholecalciferol (VITAMIN D-3) 1000 UNITS CAPS Take 1 capsule by mouth daily.  Marland Kitchen CRANBERRY PO Take 1 tablet by mouth daily.  Marland Kitchen escitalopram (LEXAPRO) 10 MG tablet Take 10 mg by mouth daily.  . fexofenadine (ALLEGRA) 180 MG tablet Take 180 mg by mouth daily.  . fish oil-omega-3 fatty acids 1000 MG capsule Take 1 g by mouth daily.  . fluticasone (FLONASE) 50 MCG/ACT nasal spray Place 2 sprays into both nostrils daily.  Marland Kitchen levocetirizine (XYZAL) 5 MG tablet Take 1 tablet (5 mg total) by mouth every evening.  . pantoprazole (PROTONIX) 40 MG tablet TAKE  ONE TABLET BY MOUTH ONCE DAILY  . simvastatin (ZOCOR) 40 MG tablet Take 1 tablet (40 mg total) by mouth every evening.  . [DISCONTINUED] ALPRAZolam (XANAX) 0.25 MG tablet Take 1 tablet (0.25 mg total) by mouth daily as needed.  . [DISCONTINUED] fluticasone (FLONASE) 50 MCG/ACT nasal spray Place 2 sprays into the nose daily.    Review of Systems Patient denies any headache now.   Some runny nose.  No sinus pressure.   No chest pain, tightness or palpitations.  No increased shortness of breath, cough or congestion.  No nausea or vomiting.  No acid reflux.  No abdominal pain or cramping.  No bowel change, such as diarrhea, BRBPR or melana.   Some constipation as outlined.  No urine change.   Handling stress relatively well.  Does take an occasional xanax.      Objective:   Physical Exam  Filed Vitals:   01/18/14 1358  BP: 120/70  Pulse: 74  Temp: 98.3 F (39.54 C)   80 year old female in no acute distress.   HEENT:  Nares - clear.  OP- without lesions or erythema.    NECK:  Supple, nontender.  No audible bruit.   HEART:  Appears to be regular. LUNGS:  Without crackles or wheezing audible.  Respirations even and unlabored.   RADIAL  PULSE:  Equal bilaterally.  ABDOMEN:  Soft, nontender.  No audible abdominal bruit.   EXTREMITIES:  No increased edema to be present.                     Assessment & Plan:  INCREASED PSYCHOSOCIAL STRESSORS.  Did not tolerate Prozac.  Trazodone did not help her sleep.  Had intolerance to Zoloft and Lexapro.  Doing well now.  Follow.  Takes an occasional xanax.  Rarely takes.   ALLERGIES.  Flonase as directed.  Persistent despite multiple medications.  Declines further evaluation.  Follow.    CARDIOVASCULAR.  ECHO 09/25/11 revealed normal heart function with no significant valve abnormality.  Currently asymptomatic.  Follow.    GI.  Declines colonsocopy.  Currently asymptomatic.    HEALTH MAINTENANCE.  Physical 07/19/13.   She is s/p hysterectomy and does  not require yearly pap smears.  Pneumovax 10/10/05.  Prevnar given today.  Mammogram 07/13/13 - negative.

## 2014-01-23 NOTE — Assessment & Plan Note (Signed)
Symptoms controlled on Protonix.  Follow.    

## 2014-01-23 NOTE — Assessment & Plan Note (Signed)
Desires not to take a bisphosphonate.  Continue calcium and vitamin D.  Follow.    

## 2014-01-23 NOTE — Assessment & Plan Note (Signed)
On Zocor.  Low cholesterol diet and exercise.  Follow lipid panel and liver function.      

## 2014-02-01 ENCOUNTER — Other Ambulatory Visit (INDEPENDENT_AMBULATORY_CARE_PROVIDER_SITE_OTHER): Payer: Medicare Other

## 2014-02-01 DIAGNOSIS — E7800 Pure hypercholesterolemia, unspecified: Secondary | ICD-10-CM | POA: Diagnosis not present

## 2014-02-01 DIAGNOSIS — K5900 Constipation, unspecified: Secondary | ICD-10-CM

## 2014-02-01 DIAGNOSIS — M810 Age-related osteoporosis without current pathological fracture: Secondary | ICD-10-CM

## 2014-02-01 DIAGNOSIS — K219 Gastro-esophageal reflux disease without esophagitis: Secondary | ICD-10-CM | POA: Diagnosis not present

## 2014-02-01 LAB — LIPID PANEL
CHOL/HDL RATIO: 4
Cholesterol: 164 mg/dL (ref 0–200)
HDL: 43.9 mg/dL (ref >39.00)
LDL Cholesterol: 93 mg/dL (ref 0–99)
TRIGLYCERIDES: 136 mg/dL (ref 0.0–149.0)
VLDL: 27.2 mg/dL (ref 0.0–40.0)

## 2014-02-01 LAB — COMPREHENSIVE METABOLIC PANEL
ALT: 12 U/L (ref 0–35)
AST: 13 U/L (ref 0–37)
Albumin: 3.9 g/dL (ref 3.5–5.2)
Alkaline Phosphatase: 59 U/L (ref 39–117)
BILIRUBIN TOTAL: 1 mg/dL (ref 0.3–1.2)
BUN: 10 mg/dL (ref 6–23)
CO2: 28 meq/L (ref 19–32)
Calcium: 9.4 mg/dL (ref 8.4–10.5)
Chloride: 106 mEq/L (ref 96–112)
Creatinine, Ser: 0.7 mg/dL (ref 0.4–1.2)
GFR: 79.62 mL/min (ref >60.00)
Glucose, Bld: 92 mg/dL (ref 70–99)
Potassium: 3.6 mEq/L (ref 3.5–5.1)
SODIUM: 140 meq/L (ref 135–145)
Total Protein: 6.2 g/dL (ref 6.0–8.3)

## 2014-02-01 LAB — VITAMIN D 25 HYDROXY (VIT D DEFICIENCY, FRACTURES): VIT D 25 HYDROXY: 50 ng/mL (ref 30–89)

## 2014-02-01 LAB — CBC WITH DIFFERENTIAL/PLATELET
BASOS PCT: 0.6 % (ref 0.0–3.0)
Basophils Absolute: 0 10*3/uL (ref 0.0–0.1)
EOS ABS: 0.1 10*3/uL (ref 0.0–0.7)
Eosinophils Relative: 2.3 % (ref 0.0–5.0)
HCT: 35.5 % — ABNORMAL LOW (ref 36.0–46.0)
Hemoglobin: 11.8 g/dL — ABNORMAL LOW (ref 12.0–15.0)
LYMPHS PCT: 37.4 % (ref 12.0–46.0)
Lymphs Abs: 2.1 10*3/uL (ref 0.7–4.0)
MCHC: 33.2 g/dL (ref 30.0–36.0)
MCV: 89.1 fl (ref 78.0–100.0)
Monocytes Absolute: 0.4 10*3/uL (ref 0.1–1.0)
Monocytes Relative: 6.8 % (ref 3.0–12.0)
NEUTROS PCT: 52.9 % (ref 43.0–77.0)
Neutro Abs: 3 10*3/uL (ref 1.4–7.7)
Platelets: 290 10*3/uL (ref 150.0–400.0)
RBC: 3.98 Mil/uL (ref 3.87–5.11)
RDW: 14.3 % (ref 11.5–14.6)
WBC: 5.6 10*3/uL (ref 4.5–10.5)

## 2014-02-01 LAB — TSH: TSH: 2.31 u[IU]/mL (ref 0.35–5.50)

## 2014-02-02 ENCOUNTER — Other Ambulatory Visit: Payer: Self-pay | Admitting: Internal Medicine

## 2014-02-02 ENCOUNTER — Encounter: Payer: Self-pay | Admitting: *Deleted

## 2014-02-02 DIAGNOSIS — D649 Anemia, unspecified: Secondary | ICD-10-CM

## 2014-02-02 NOTE — Progress Notes (Signed)
Orders placed for f/u labs.  

## 2014-02-15 ENCOUNTER — Other Ambulatory Visit (INDEPENDENT_AMBULATORY_CARE_PROVIDER_SITE_OTHER): Payer: Medicare Other

## 2014-02-15 DIAGNOSIS — D649 Anemia, unspecified: Secondary | ICD-10-CM

## 2014-02-15 LAB — CBC WITH DIFFERENTIAL/PLATELET
BASOS ABS: 0 10*3/uL (ref 0.0–0.1)
Basophils Relative: 0.8 % (ref 0.0–3.0)
EOS ABS: 0.2 10*3/uL (ref 0.0–0.7)
Eosinophils Relative: 2.4 % (ref 0.0–5.0)
HCT: 36.7 % (ref 36.0–46.0)
HEMOGLOBIN: 12.4 g/dL (ref 12.0–15.0)
LYMPHS PCT: 42.8 % (ref 12.0–46.0)
Lymphs Abs: 2.7 10*3/uL (ref 0.7–4.0)
MCHC: 33.9 g/dL (ref 30.0–36.0)
MCV: 89 fl (ref 78.0–100.0)
MONOS PCT: 8.1 % (ref 3.0–12.0)
Monocytes Absolute: 0.5 10*3/uL (ref 0.1–1.0)
NEUTROS ABS: 2.9 10*3/uL (ref 1.4–7.7)
Neutrophils Relative %: 45.9 % (ref 43.0–77.0)
PLATELETS: 296 10*3/uL (ref 150.0–400.0)
RBC: 4.12 Mil/uL (ref 3.87–5.11)
RDW: 14.3 % (ref 11.5–14.6)
WBC: 6.3 10*3/uL (ref 4.5–10.5)

## 2014-02-15 LAB — IBC PANEL
Iron: 84 ug/dL (ref 42–145)
SATURATION RATIOS: 28.8 % (ref 20.0–50.0)
TRANSFERRIN: 208 mg/dL — AB (ref 212.0–360.0)

## 2014-02-15 LAB — FERRITIN: FERRITIN: 35.5 ng/mL (ref 10.0–291.0)

## 2014-02-16 ENCOUNTER — Other Ambulatory Visit: Payer: Medicare Other

## 2014-02-16 ENCOUNTER — Encounter: Payer: Self-pay | Admitting: *Deleted

## 2014-02-25 ENCOUNTER — Other Ambulatory Visit (INDEPENDENT_AMBULATORY_CARE_PROVIDER_SITE_OTHER): Payer: Medicare Other

## 2014-02-25 DIAGNOSIS — Z1211 Encounter for screening for malignant neoplasm of colon: Secondary | ICD-10-CM

## 2014-02-25 LAB — FECAL OCCULT BLOOD, IMMUNOCHEMICAL: Fecal Occult Bld: NEGATIVE

## 2014-02-28 ENCOUNTER — Encounter: Payer: Self-pay | Admitting: *Deleted

## 2014-05-10 ENCOUNTER — Other Ambulatory Visit: Payer: Self-pay | Admitting: Internal Medicine

## 2014-05-24 ENCOUNTER — Other Ambulatory Visit: Payer: Self-pay | Admitting: Internal Medicine

## 2014-05-25 NOTE — Telephone Encounter (Signed)
Last refill and OV 3.17.15, next OV 9.22.15.  Please advise refill.

## 2014-05-25 NOTE — Telephone Encounter (Signed)
Rx faxed

## 2014-05-25 NOTE — Telephone Encounter (Signed)
Refilled xanax #30 with no refills.  rx signed and placed on your desk.  

## 2014-05-26 DIAGNOSIS — H40009 Preglaucoma, unspecified, unspecified eye: Secondary | ICD-10-CM | POA: Diagnosis not present

## 2014-05-26 DIAGNOSIS — H02059 Trichiasis without entropian unspecified eye, unspecified eyelid: Secondary | ICD-10-CM | POA: Diagnosis not present

## 2014-07-26 ENCOUNTER — Encounter: Payer: Self-pay | Admitting: Internal Medicine

## 2014-07-26 ENCOUNTER — Ambulatory Visit (INDEPENDENT_AMBULATORY_CARE_PROVIDER_SITE_OTHER): Payer: Medicare Other | Admitting: Internal Medicine

## 2014-07-26 VITALS — BP 130/80 | HR 65 | Temp 98.2°F | Ht 62.0 in | Wt 142.8 lb

## 2014-07-26 DIAGNOSIS — Z23 Encounter for immunization: Secondary | ICD-10-CM

## 2014-07-26 DIAGNOSIS — K5900 Constipation, unspecified: Secondary | ICD-10-CM

## 2014-07-26 DIAGNOSIS — E7800 Pure hypercholesterolemia, unspecified: Secondary | ICD-10-CM

## 2014-07-26 DIAGNOSIS — M810 Age-related osteoporosis without current pathological fracture: Secondary | ICD-10-CM

## 2014-07-26 DIAGNOSIS — Z1239 Encounter for other screening for malignant neoplasm of breast: Secondary | ICD-10-CM

## 2014-07-26 DIAGNOSIS — F439 Reaction to severe stress, unspecified: Secondary | ICD-10-CM

## 2014-07-26 DIAGNOSIS — Z733 Stress, not elsewhere classified: Secondary | ICD-10-CM

## 2014-07-26 DIAGNOSIS — Z9109 Other allergy status, other than to drugs and biological substances: Secondary | ICD-10-CM

## 2014-07-26 DIAGNOSIS — K219 Gastro-esophageal reflux disease without esophagitis: Secondary | ICD-10-CM

## 2014-07-26 MED ORDER — CITALOPRAM HYDROBROMIDE 10 MG PO TABS: 10.0000 mg | ORAL_TABLET | Freq: Every day | ORAL | Status: DC

## 2014-07-26 MED ORDER — AZELASTINE HCL 0.1 % NA SOLN: 1.0000 | Freq: Two times a day (BID) | NASAL | Status: DC

## 2014-07-26 NOTE — Progress Notes (Signed)
Pre visit review using our clinic review tool, if applicable. No additional management support is needed unless otherwise documented below in the visit note. 

## 2014-07-27 ENCOUNTER — Telehealth: Payer: Self-pay | Admitting: *Deleted

## 2014-07-27 NOTE — Telephone Encounter (Signed)
Sister states that handicap form was written in her name yesterday. She would like to know if if could be written in [place] name instead so that whomever is driving her (i.e.: son) could use it. Please call sister ([place]) when ready. (From placed in red folder)

## 2014-07-27 NOTE — Telephone Encounter (Signed)
New form completed and placed in your box.

## 2014-07-27 NOTE — Telephone Encounter (Signed)
Form placed up front & left voicemail to notify Ms. Zenia Resides (sister)

## 2014-07-31 ENCOUNTER — Encounter: Payer: Self-pay | Admitting: Internal Medicine

## 2014-07-31 DIAGNOSIS — F439 Reaction to severe stress, unspecified: Secondary | ICD-10-CM | POA: Insufficient documentation

## 2014-07-31 DIAGNOSIS — Z9109 Other allergy status, other than to drugs and biological substances: Secondary | ICD-10-CM | POA: Insufficient documentation

## 2014-07-31 NOTE — Progress Notes (Signed)
Subjective:    Patient ID: Sabrina Cervantes, female    DOB: January 22, 1929, 80 y.o.   MRN: [id number]  HPI 80 year old female with past history of nephrolithiasis and hypercholesterolemia who comes in today to follow up on these issues as well as for a complete physical exam.  States she is overall doing relatively well.  She is having some issues with a runny nose.  Has tried claritin, zyrtec, allegra and steroid nasal spray.  Has also tried xyzal and astelin.  States astelin did help some.  She continues to have persistent problems.   I want to refer her back to ENT.  She declines.  Eating and drinking well.  No chest pain or tightness.  She does take an occasional xanax.  Request a refill.  We discussed this at length.  Increased stress.  I feels she needs something on a more regular basis.  We discussed taking a longer acting medication.       Past Medical History  Diagnosis Date  . Hypercholesterolemia   . Osteoporosis   . Basal cell carcinoma   . Anemia   . Vasomotor rhinitis     chronic  . Nephrolithiasis     pyelonephritis, s/p ureteral stent    Outpatient Encounter Prescriptions as of 07/26/2014  Medication Sig  . ALPRAZolam (XANAX) 0.25 MG tablet TAKE ONE TABLET BY MOUTH ONCE DAILY AS NEEDED  . azelastine (ASTELIN) 0.1 % nasal spray Place 1 spray into both nostrils 2 (two) times daily. Use in each nostril as directed  . Calcium Carbonate-Vitamin D (CALCIUM 600+D) 600-400 MG-UNIT per tablet Take 1 tablet by mouth 2 (two) times daily.  . Cholecalciferol (VITAMIN D-3) 1000 UNITS CAPS Take 1 capsule by mouth daily.  Marland Kitchen CRANBERRY PO Take 1 tablet by mouth daily.  Marland Kitchen escitalopram (LEXAPRO) 10 MG tablet Take 10 mg by mouth daily.  . fexofenadine (ALLEGRA) 180 MG tablet Take 180 mg by mouth daily.  . fish oil-omega-3 fatty acids 1000 MG capsule Take 1 g by mouth daily.  . fluticasone (FLONASE) 50 MCG/ACT nasal spray Place 2 sprays into both nostrils daily.  Marland Kitchen levocetirizine (XYZAL) 5 MG tablet  Take 1 tablet (5 mg total) by mouth every evening.  . pantoprazole (PROTONIX) 40 MG tablet TAKE ONE TABLET BY MOUTH ONCE DAILY  . simvastatin (ZOCOR) 40 MG tablet TAKE ONE TABLET BY MOUTH IN THE EVENING  . [DISCONTINUED] azelastine (ASTELIN) 137 MCG/SPRAY nasal spray Place 1 spray into the nose 2 (two) times daily. Use in each nostril as directed  . citalopram (CELEXA) 10 MG tablet Take 1 tablet (10 mg total) by mouth daily.    Review of Systems Patient denies any headache now.   Some runny nose and a sore throat.   No sinus pressure.   No chest pain, tightness or palpitations.  No increased shortness of breath, cough or congestion.  No nausea or vomiting.  No acid reflux.  No abdominal pain or cramping.  No bowel change, such as diarrhea, BRBPR or melana.   No urine change.  Increased stress as outlined.  Some mild depression.       Objective:   Physical Exam  Filed Vitals:   07/26/14 1327  BP: 130/80  Pulse: 65  Temp: 98.2 F (38.11 C)   80 year old female in no acute distress.   HEENT:  Nares- clear.  Oropharynx - without lesions. NECK:  Supple.  Nontender.  No audible bruit.  HEART:  Appears to be  regular. LUNGS:  No crackles or wheezing audible.  Respirations even and unlabored.  RADIAL PULSE:  Equal bilaterally.    BREASTS:  No nipple discharge or nipple retraction present.  Could not appreciate any distinct nodules or axillary adenopathy.  ABDOMEN:  Soft, nontender.  Bowel sounds present and normal.  No audible abdominal bruit.  GU:  Not performed.    EXTREMITIES:  No increased edema present.  DP pulses palpable and equal bilaterally.             Assessment & Plan:  INCREASED PSYCHOSOCIAL STRESSORS.  Did not tolerate Prozac.  Trazodone did not help her sleep.  Had intolerance to Zoloft and Lexapro.  Increased stress and some anxiety.  Will start citalopram 10mg  q day.  Follow.  Get her back in soon to reassess.     ALLERGIES.  Persistent despite multiple medications.   Declines further evaluation/referral to ENT.  Refill astelin.   Follow.    CARDIOVASCULAR.  ECHO 09/25/11 revealed normal heart function with no significant valve abnormality.  Currently asymptomatic.  Follow.    GI.  Declines colonsocopy.  Currently asymptomatic.    HEALTH MAINTENANCE.  Physical today.   She is s/p hysterectomy and does not require yearly pap smears.  Pneumovax 10/10/05.  Prevnar given last visit.  Mammogram 07/13/13 - negative.   Schedule f/u mammogram.

## 2014-07-31 NOTE — Assessment & Plan Note (Signed)
Increased stress as outlined.  Some anxiety.  Start citalopram 10mg  q day.  Follow.  Get her back in soon to reassess.

## 2014-07-31 NOTE — Assessment & Plan Note (Signed)
Desires not to take a bisphosphonate.  Continue calcium and vitamin D.  Follow.

## 2014-07-31 NOTE — Assessment & Plan Note (Signed)
Not reported as an issue today.  Follow.   

## 2014-07-31 NOTE — Assessment & Plan Note (Signed)
Symptoms controlled on Protonix.  Follow.

## 2014-07-31 NOTE — Assessment & Plan Note (Signed)
Hs tried multiple medications as outlined.  Refill astelin.  Follow.  Declines ENT evaluation.

## 2014-07-31 NOTE — Assessment & Plan Note (Signed)
On Zocor.  Low cholesterol diet and exercise.  Follow lipid panel and liver function.

## 2014-08-09 ENCOUNTER — Other Ambulatory Visit: Payer: Self-pay | Admitting: Internal Medicine

## 2014-08-23 ENCOUNTER — Ambulatory Visit: Payer: Self-pay | Admitting: Internal Medicine

## 2014-08-23 DIAGNOSIS — Z1231 Encounter for screening mammogram for malignant neoplasm of breast: Secondary | ICD-10-CM | POA: Diagnosis not present

## 2014-08-23 LAB — HM MAMMOGRAPHY: HM MAMMO: NEGATIVE

## 2014-08-24 ENCOUNTER — Encounter: Payer: Self-pay | Admitting: *Deleted

## 2014-08-30 ENCOUNTER — Encounter: Payer: Self-pay | Admitting: Internal Medicine

## 2014-09-13 ENCOUNTER — Other Ambulatory Visit: Payer: Self-pay | Admitting: Internal Medicine

## 2014-09-27 ENCOUNTER — Ambulatory Visit (INDEPENDENT_AMBULATORY_CARE_PROVIDER_SITE_OTHER): Payer: Medicare Other | Admitting: Internal Medicine

## 2014-09-27 ENCOUNTER — Encounter: Payer: Self-pay | Admitting: Internal Medicine

## 2014-09-27 VITALS — BP 110/70 | HR 69 | Temp 98.2°F | Ht 62.0 in | Wt 140.2 lb

## 2014-09-27 DIAGNOSIS — Z91048 Other nonmedicinal substance allergy status: Secondary | ICD-10-CM

## 2014-09-27 DIAGNOSIS — J019 Acute sinusitis, unspecified: Secondary | ICD-10-CM | POA: Diagnosis not present

## 2014-09-27 DIAGNOSIS — E7800 Pure hypercholesterolemia, unspecified: Secondary | ICD-10-CM

## 2014-09-27 DIAGNOSIS — Z658 Other specified problems related to psychosocial circumstances: Secondary | ICD-10-CM | POA: Diagnosis not present

## 2014-09-27 DIAGNOSIS — K219 Gastro-esophageal reflux disease without esophagitis: Secondary | ICD-10-CM

## 2014-09-27 DIAGNOSIS — R42 Dizziness and giddiness: Secondary | ICD-10-CM

## 2014-09-27 DIAGNOSIS — F439 Reaction to severe stress, unspecified: Secondary | ICD-10-CM

## 2014-09-27 DIAGNOSIS — Z9109 Other allergy status, other than to drugs and biological substances: Secondary | ICD-10-CM

## 2014-09-27 MED ORDER — AMOXICILLIN 875 MG PO TABS: 875.0000 mg | ORAL_TABLET | Freq: Two times a day (BID) | ORAL | Status: DC

## 2014-09-27 NOTE — Progress Notes (Signed)
Pre visit review using our clinic review tool, if applicable. No additional management support is needed unless otherwise documented below in the visit note. 

## 2014-09-27 NOTE — Patient Instructions (Signed)
Saline nasal spray - flush nose at least 2-3x/day  Nasacort - 2 sprays each nostril one time per day.  Do this in the evening.

## 2014-10-02 ENCOUNTER — Encounter: Payer: Self-pay | Admitting: Internal Medicine

## 2014-10-02 DIAGNOSIS — J329 Chronic sinusitis, unspecified: Secondary | ICD-10-CM | POA: Insufficient documentation

## 2014-10-02 DIAGNOSIS — R42 Dizziness and giddiness: Secondary | ICD-10-CM | POA: Insufficient documentation

## 2014-10-02 NOTE — Progress Notes (Signed)
Subjective:    Patient ID: Sabrina Cervantes, female    DOB: October 04, 1929, 80 y.o.   MRN: [id number]  HPI 81 year old female with past history of nephrolithiasis and hypercholesterolemia who comes in today for a scheduled follow up.  She has issues with allergies.  Has tried claritin, zyrtec, allegra and steroid nasal spray.  Has also tried xyzal and astelin.  States astelin did help some.  She reports now having increased sore throat and earache.  Increased sinus pressure.  Runny nose.  Feels like a sinus infection.  Not getting any better. Getting worse over the last couple of weeks.    Eating and drinking well.  No chest pain or tightness.   Increased stress.  We discussed this last visit and again today.  Had placed her on citalopram.  Did not tolerate.  Desires not to take any medication now.  Discussed referral to a counselor or psychiatrist.  She declines.  States she feels she is doing ok and desires no further intervention.        Past Medical History  Diagnosis Date  . Hypercholesterolemia   . Osteoporosis   . Basal cell carcinoma   . Anemia   . Vasomotor rhinitis     chronic  . Nephrolithiasis     pyelonephritis, s/p ureteral stent    Outpatient Encounter Prescriptions as of 09/27/2014  Medication Sig  . ALPRAZolam (XANAX) 0.25 MG tablet TAKE ONE TABLET BY MOUTH ONCE DAILY AS NEEDED  . azelastine (ASTELIN) 0.1 % nasal spray Place 1 spray into both nostrils 2 (two) times daily. Use in each nostril as directed  . Calcium Carbonate-Vitamin D (CALCIUM 600+D) 600-400 MG-UNIT per tablet Take 1 tablet by mouth 2 (two) times daily.  . Cholecalciferol (VITAMIN D-3) 1000 UNITS CAPS Take 1 capsule by mouth daily.  Marland Kitchen CRANBERRY PO Take 1 tablet by mouth daily.  . fish oil-omega-3 fatty acids 1000 MG capsule Take 1 g by mouth daily.  . fluticasone (FLONASE) 50 MCG/ACT nasal spray Place 2 sprays into both nostrils daily.  . pantoprazole (PROTONIX) 40 MG tablet TAKE ONE TABLET BY MOUTH ONCE DAILY  .  simvastatin (ZOCOR) 40 MG tablet TAKE ONE TABLET BY MOUTH IN THE EVENING  . [DISCONTINUED] citalopram (CELEXA) 10 MG tablet Take 1 tablet (10 mg total) by mouth daily.  . [DISCONTINUED] escitalopram (LEXAPRO) 10 MG tablet Take 10 mg by mouth daily.  Marland Kitchen amoxicillin (AMOXIL) 875 MG tablet Take 1 tablet (875 mg total) by mouth 2 (two) times daily.  Marland Kitchen levocetirizine (XYZAL) 5 MG tablet Take 1 tablet (5 mg total) by mouth every evening. (Patient not taking: Reported on 09/27/2014)  . [DISCONTINUED] fexofenadine (ALLEGRA) 180 MG tablet Take 180 mg by mouth daily.    Review of Systems Patient denies any headache now.   Some dizziness- she describes as inner ear.  Some runny nose and a sore throat.   Increased sinus pressure and congestion.  See above.   No chest pain, tightness or palpitations.  No increased shortness of breath, cough or congestion.  No nausea or vomiting.  No acid reflux.  No abdominal pain or cramping.  No bowel change, such as diarrhea, BRBPR or melana.   No urine change.  Increased stress as outlined.       Objective:   Physical Exam  Filed Vitals:   09/27/14 1552  BP: 110/70  Pulse: 69  Temp: 98.2 F (49.43 C)   80 year old female in no acute distress.  HEENT:  Nares- erythematous turbinates.  Oropharynx - without lesions.  Increased sinus pressure to palpation.   NECK:  Supple.  Nontender.  No audible bruit.  HEART:  Appears to be regular. LUNGS:  No crackles or wheezing audible.  Respirations even and unlabored.  RADIAL PULSE:  Equal bilaterally.  ABDOMEN:  Soft, nontender.  Bowel sounds present and normal.  No audible abdominal bruit.    EXTREMITIES:  No increased edema present.  DP pulses palpable and equal bilaterally.             Assessment & Plan:  1. Gastroesophageal reflux disease, esophagitis presence not specified Controlled on protonix.    2. Hypercholesterolemia Low cholesterol diet and exercise.  On simvastatin.  Follow lipid panel and liver function  tests.  Lab Results  Component Value Date   CHOL 164 02/01/2014   HDL 43.90 02/01/2014   LDLCALC 93 02/01/2014   LDLDIRECT 109.2 07/13/2013   TRIG 136.0 02/01/2014   CHOLHDL 4 02/01/2014   3. Environmental allergies Has tried multiple medications as outlined.  Treat current sinus infection.  Declines referral to ENT.    4. Stress Increased stress as outlined.  Has tried multiple medications as outlined.  Discussed counseling and referral to psych.  She declines any further intervention at this time.  Follow.    5. Dizziness  Better now. She relates it to inner ear.  Declines further w/up.  Treat current sinus infection.    6. Acute sinusitis, recurrence not specified, unspecified location Treat with amoxicillin as directed.  Saline nasal spray as directed.  Follow.    7. ALLERGIES.  Persistent despite multiple medications.  Now with sinus infection.  Treat infection.   Declines further evaluation/referral to ENT.  Follow.  Will notify me if she changes her mind.    8. CARDIOVASCULAR.  ECHO 09/25/11 revealed normal heart function with no significant valve abnormality.  Currently asymptomatic.  Follow.    9. GI.  Declines colonsocopy.  Currently asymptomatic.    HEALTH MAINTENANCE.  Physical 07/26/14.   She is s/p hysterectomy and does not require yearly pap smears.    Mammogram 08/23/14 - Birads I.

## 2014-10-19 ENCOUNTER — Other Ambulatory Visit: Payer: Self-pay | Admitting: Internal Medicine

## 2014-10-20 ENCOUNTER — Other Ambulatory Visit: Payer: Self-pay | Admitting: Internal Medicine

## 2014-10-20 NOTE — Telephone Encounter (Signed)
Rx called into to pharmacy

## 2014-10-20 NOTE — Telephone Encounter (Signed)
Refilled xanax #30 with no refills.

## 2014-10-20 NOTE — Telephone Encounter (Signed)
Last refill 7.22.15, last OV 11.24.15.  Please advisee refill

## 2014-11-05 ENCOUNTER — Other Ambulatory Visit: Payer: Self-pay | Admitting: Internal Medicine

## 2014-11-23 DIAGNOSIS — H40003 Preglaucoma, unspecified, bilateral: Secondary | ICD-10-CM | POA: Diagnosis not present

## 2014-12-13 DIAGNOSIS — H40003 Preglaucoma, unspecified, bilateral: Secondary | ICD-10-CM | POA: Diagnosis not present

## 2015-01-04 ENCOUNTER — Other Ambulatory Visit: Payer: Self-pay | Admitting: Internal Medicine

## 2015-01-04 NOTE — Telephone Encounter (Signed)
Called to pharmacy. Left message to call back, needs to schedule appt.

## 2015-01-04 NOTE — Telephone Encounter (Signed)
ok'd refill for alprazolam #30 with no refills.  She needs a f/u appt scheduled with me within the next 2 months.  (30 min appt)

## 2015-01-04 NOTE — Telephone Encounter (Signed)
Last visit 09/27/14, last refill 10/20/14 #30

## 2015-03-30 ENCOUNTER — Ambulatory Visit: Payer: Medicare Other | Admitting: Internal Medicine

## 2015-04-04 ENCOUNTER — Ambulatory Visit: Payer: Medicare Other | Admitting: Internal Medicine

## 2015-04-05 ENCOUNTER — Ambulatory Visit: Payer: Medicare Other | Admitting: Internal Medicine

## 2015-04-12 ENCOUNTER — Encounter: Payer: Self-pay | Admitting: Internal Medicine

## 2015-04-12 ENCOUNTER — Ambulatory Visit (INDEPENDENT_AMBULATORY_CARE_PROVIDER_SITE_OTHER): Payer: Medicare Other | Admitting: Internal Medicine

## 2015-04-12 VITALS — BP 125/71 | HR 70 | Temp 98.0°F | Ht 62.0 in | Wt 143.0 lb

## 2015-04-12 DIAGNOSIS — F439 Reaction to severe stress, unspecified: Secondary | ICD-10-CM

## 2015-04-12 DIAGNOSIS — E7800 Pure hypercholesterolemia, unspecified: Secondary | ICD-10-CM

## 2015-04-12 DIAGNOSIS — M810 Age-related osteoporosis without current pathological fracture: Secondary | ICD-10-CM | POA: Diagnosis not present

## 2015-04-12 DIAGNOSIS — Z91048 Other nonmedicinal substance allergy status: Secondary | ICD-10-CM

## 2015-04-12 DIAGNOSIS — Z9109 Other allergy status, other than to drugs and biological substances: Secondary | ICD-10-CM

## 2015-04-12 DIAGNOSIS — K219 Gastro-esophageal reflux disease without esophagitis: Secondary | ICD-10-CM | POA: Diagnosis not present

## 2015-04-12 DIAGNOSIS — Z658 Other specified problems related to psychosocial circumstances: Secondary | ICD-10-CM

## 2015-04-12 DIAGNOSIS — Z0000 Encounter for general adult medical examination without abnormal findings: Secondary | ICD-10-CM

## 2015-04-12 MED ORDER — ALPRAZOLAM 0.25 MG PO TABS: ORAL_TABLET | ORAL | Status: DC

## 2015-04-12 MED ORDER — SIMVASTATIN 40 MG PO TABS: 40.0000 mg | ORAL_TABLET | Freq: Every evening | ORAL | Status: DC

## 2015-04-12 NOTE — Progress Notes (Signed)
Pre visit review using our clinic review tool, if applicable. No additional management support is needed unless otherwise documented below in the visit note. 

## 2015-04-12 NOTE — Progress Notes (Signed)
Patient ID: RICHARD HOLZ, female   DOB: September 18, 1929, 81 y.o.   MRN: [id number]   Subjective:    Patient ID: [name], female    DOB: 12-24-1928, 81 y.o.   MRN: [id number]  HPI  Patient here for a scheduled follow up.  Still with some allergy symptoms.  Have tried multiple medications and nasal sprays.  Reports have not helped.  Discussed referral to ENT.  She has declined and continues to decline.  Breathing stable.  No chest pain or tightness.  Eating and drinking well.  Bowels stable.  Stress appears to be better.    Past Medical History  Diagnosis Date  . Hypercholesterolemia   . Osteoporosis   . Basal cell carcinoma   . Anemia   . Vasomotor rhinitis     chronic  . Nephrolithiasis     pyelonephritis, s/p ureteral stent    Current Outpatient Prescriptions on File Prior to Visit  Medication Sig Dispense Refill  . amoxicillin (AMOXIL) 875 MG tablet Take 1 tablet (875 mg total) by mouth 2 (two) times daily. 20 tablet 0  . azelastine (ASTELIN) 0.1 % nasal spray Place 1 spray into both nostrils 2 (two) times daily. Use in each nostril as directed 30 mL 1  . Calcium Carbonate-Vitamin D (CALCIUM 600+D) 600-400 MG-UNIT per tablet Take 1 tablet by mouth 2 (two) times daily.    . Cholecalciferol (VITAMIN D-3) 1000 UNITS CAPS Take 1 capsule by mouth daily.    Marland Kitchen CRANBERRY PO Take 1 tablet by mouth daily.    . fish oil-omega-3 fatty acids 1000 MG capsule Take 1 g by mouth daily.    . fluticasone (FLONASE) 50 MCG/ACT nasal spray Place 2 sprays into both nostrils daily. 16 g 3  . levocetirizine (XYZAL) 5 MG tablet Take 1 tablet (5 mg total) by mouth every evening. 30 tablet 3  . pantoprazole (PROTONIX) 40 MG tablet TAKE ONE TABLET BY MOUTH ONCE DAILY 90 tablet 1   No current facility-administered medications on file prior to visit.    Review of Systems  Constitutional: Negative for appetite change and unexpected weight change.  HENT: Positive for congestion and postnasal drip. Negative for sinus  pressure.   Respiratory: Negative for cough, chest tightness and shortness of breath.   Cardiovascular: Negative for chest pain, palpitations and leg swelling.  Gastrointestinal: Negative for nausea, vomiting, abdominal pain and diarrhea.  Musculoskeletal: Negative for joint swelling.  Skin: Negative for color change and rash.  Neurological: Negative for dizziness, light-headedness and headaches.  Hematological: Negative for adenopathy. Does not bruise/bleed easily.  Psychiatric/Behavioral: Negative for dysphoric mood and agitation.       Objective:     Blood pressure recheck;  138/72  Physical Exam  Constitutional: She appears well-developed and well-nourished. No distress.  HENT:  Nose: Nose normal.  Mouth/Throat: Oropharynx is clear and moist.  Neck: Neck supple. No thyromegaly present.  Cardiovascular: Normal rate and regular rhythm.   Pulmonary/Chest: Breath sounds normal. No respiratory distress. She has no wheezes.  Abdominal: Soft. Bowel sounds are normal. There is no tenderness.  Musculoskeletal: She exhibits no edema or tenderness.  Lymphadenopathy:    She has no cervical adenopathy.  Skin: No rash noted. No erythema.  Psychiatric: She has a normal mood and affect. Her behavior is normal.    BP 125/71 mmHg  Pulse 70  Temp(Src) 98 F (36.7 C) (Oral)  Ht 5\' 2"  (1.575 m)  Wt 143 lb (64.864 kg)  BMI 26.15 kg/m2  SpO2 99% Wt Readings from Last 3 Encounters:  04/12/15 143 lb (64.864 kg)  09/27/14 140 lb 4 oz (63.617 kg)  07/26/14 142 lb 12 oz (64.751 kg)     Lab Results  Component Value Date   WBC 6.3 02/15/2014   HGB 12.4 02/15/2014   HCT 36.7 02/15/2014   PLT 296.0 02/15/2014   GLUCOSE 92 02/01/2014   CHOL 164 02/01/2014   TRIG 136.0 02/01/2014   HDL 43.90 02/01/2014   LDLDIRECT 109.2 07/13/2013   LDLCALC 93 02/01/2014   ALT 12 02/01/2014   AST 13 02/01/2014   NA 140 02/01/2014   K 3.6 02/01/2014   CL 106 02/01/2014   CREATININE 0.7 02/01/2014    BUN 10 02/01/2014   CO2 28 02/01/2014   TSH 2.31 02/01/2014       Assessment & Plan:   Problem List Items Addressed This Visit    Environmental allergies    Persistent.  Declines ENT evaluation.        GERD (gastroesophageal reflux disease) - Primary    Symptoms controlled on protonix.  Follow.        Relevant Orders   CBC with Differential/Platelet   Health care maintenance    Physical 07/26/14.  Mammogram 08/23/14 - Birads I.        Hypercholesterolemia    Low cholesterol diet and exercise.  Follow lipid panel. On simvastatin.       Relevant Medications   simvastatin (ZOCOR) 40 MG tablet   Other Relevant Orders   TSH   Osteoporosis    Desires not to take bisphosphonates.  Continue calcium and vitamin D supplements.        Relevant Orders   Vit D  25 hydroxy (rtn osteoporosis monitoring)   Stress    Appears to be handling stress - doing better.  Follow.  Takes xanax prn.         I spent 25 minutes with the patient and more than 50% of the time was spent in consultation regarding the above.     Einar Pheasant, MD

## 2015-04-17 ENCOUNTER — Encounter: Payer: Self-pay | Admitting: Internal Medicine

## 2015-04-17 DIAGNOSIS — Z0000 Encounter for general adult medical examination without abnormal findings: Secondary | ICD-10-CM | POA: Insufficient documentation

## 2015-04-17 NOTE — Assessment & Plan Note (Signed)
Physical 07/26/14.  Mammogram 08/23/14 - Birads I.

## 2015-04-17 NOTE — Assessment & Plan Note (Signed)
Persistent.  Declines ENT evaluation.

## 2015-04-17 NOTE — Assessment & Plan Note (Signed)
Symptoms controlled on protonix.  Follow.   

## 2015-04-17 NOTE — Assessment & Plan Note (Signed)
Low cholesterol diet and exercise.  Follow lipid panel.  On simvastatin.   

## 2015-04-17 NOTE — Assessment & Plan Note (Signed)
Desires not to take bisphosphonates.  Continue calcium and vitamin D supplements.

## 2015-04-17 NOTE — Assessment & Plan Note (Signed)
Appears to be handling stress - doing better.  Follow.  Takes xanax prn.

## 2015-04-19 ENCOUNTER — Other Ambulatory Visit: Payer: Medicare Other

## 2015-04-20 ENCOUNTER — Other Ambulatory Visit (INDEPENDENT_AMBULATORY_CARE_PROVIDER_SITE_OTHER): Payer: Medicare Other

## 2015-04-20 DIAGNOSIS — E7800 Pure hypercholesterolemia, unspecified: Secondary | ICD-10-CM

## 2015-04-20 DIAGNOSIS — M810 Age-related osteoporosis without current pathological fracture: Secondary | ICD-10-CM | POA: Diagnosis not present

## 2015-04-20 DIAGNOSIS — K219 Gastro-esophageal reflux disease without esophagitis: Secondary | ICD-10-CM

## 2015-04-20 LAB — CBC WITH DIFFERENTIAL/PLATELET
Basophils Absolute: 0 10*3/uL (ref 0.0–0.1)
Basophils Relative: 0.6 % (ref 0.0–3.0)
EOS ABS: 0.2 10*3/uL (ref 0.0–0.7)
EOS PCT: 2.7 % (ref 0.0–5.0)
HCT: 38.6 % (ref 36.0–46.0)
HEMOGLOBIN: 12.7 g/dL (ref 12.0–15.0)
LYMPHS ABS: 3.1 10*3/uL (ref 0.7–4.0)
Lymphocytes Relative: 43.6 % (ref 12.0–46.0)
MCHC: 33 g/dL (ref 30.0–36.0)
MCV: 88.8 fl (ref 78.0–100.0)
MONO ABS: 0.6 10*3/uL (ref 0.1–1.0)
Monocytes Relative: 8.2 % (ref 3.0–12.0)
Neutro Abs: 3.1 10*3/uL (ref 1.4–7.7)
Neutrophils Relative %: 44.9 % (ref 43.0–77.0)
Platelets: 285 10*3/uL (ref 150.0–400.0)
RBC: 4.35 Mil/uL (ref 3.87–5.11)
RDW: 14.4 % (ref 11.5–15.5)
WBC: 7 10*3/uL (ref 4.0–10.5)

## 2015-04-20 LAB — LIPID PANEL
Cholesterol: 168 mg/dL (ref 0–200)
HDL: 41.7 mg/dL (ref >39.00)
LDL CALC: 99 mg/dL (ref 0–99)
NonHDL: 126.3
Total CHOL/HDL Ratio: 4
Triglycerides: 138 mg/dL (ref 0.0–149.0)
VLDL: 27.6 mg/dL (ref 0.0–40.0)

## 2015-04-20 LAB — COMPREHENSIVE METABOLIC PANEL
ALT: 11 U/L (ref 0–35)
AST: 17 U/L (ref 0–37)
Albumin: 4.1 g/dL (ref 3.5–5.2)
Alkaline Phosphatase: 74 U/L (ref 39–117)
BUN: 15 mg/dL (ref 6–23)
CO2: 29 mEq/L (ref 19–32)
CREATININE: 0.83 mg/dL (ref 0.40–1.20)
Calcium: 9.9 mg/dL (ref 8.4–10.5)
Chloride: 107 mEq/L (ref 96–112)
GFR: 69.53 mL/min (ref >60.00)
Glucose, Bld: 98 mg/dL (ref 70–99)
POTASSIUM: 4.3 meq/L (ref 3.5–5.1)
Sodium: 140 mEq/L (ref 135–145)
Total Bilirubin: 0.8 mg/dL (ref 0.2–1.2)
Total Protein: 6.2 g/dL (ref 6.0–8.3)

## 2015-04-20 LAB — VITAMIN D 25 HYDROXY (VIT D DEFICIENCY, FRACTURES): VITD: 25.7 ng/mL — ABNORMAL LOW (ref 30.00–100.00)

## 2015-04-20 LAB — TSH: TSH: 3.36 u[IU]/mL (ref 0.35–4.50)

## 2015-04-21 ENCOUNTER — Encounter: Payer: Self-pay | Admitting: *Deleted

## 2015-05-01 ENCOUNTER — Other Ambulatory Visit: Payer: Self-pay | Admitting: Internal Medicine

## 2015-06-15 DIAGNOSIS — H40003 Preglaucoma, unspecified, bilateral: Secondary | ICD-10-CM | POA: Diagnosis not present

## 2015-06-15 DIAGNOSIS — H35341 Macular cyst, hole, or pseudohole, right eye: Secondary | ICD-10-CM | POA: Diagnosis not present

## 2015-07-22 ENCOUNTER — Other Ambulatory Visit: Payer: Self-pay | Admitting: Internal Medicine

## 2015-07-23 NOTE — Telephone Encounter (Signed)
Okay to refill Xanax? Last seen and filled on 04/12/15.

## 2015-07-23 NOTE — Telephone Encounter (Signed)
Refilled simvastatin #90 with no refills and xanax #30 with no refills.

## 2015-07-24 NOTE — Telephone Encounter (Signed)
Rx printed for Xanax & will fax once it has been signed to [facility]

## 2015-07-29 ENCOUNTER — Other Ambulatory Visit: Payer: Self-pay | Admitting: Internal Medicine

## 2015-08-24 ENCOUNTER — Encounter: Payer: Self-pay | Admitting: Internal Medicine

## 2015-08-24 ENCOUNTER — Ambulatory Visit (INDEPENDENT_AMBULATORY_CARE_PROVIDER_SITE_OTHER): Payer: Medicare Other | Admitting: Internal Medicine

## 2015-08-24 VITALS — BP 110/58 | HR 79 | Temp 98.1°F | Resp 18 | Ht 62.0 in | Wt 138.4 lb

## 2015-08-24 DIAGNOSIS — E7800 Pure hypercholesterolemia, unspecified: Secondary | ICD-10-CM

## 2015-08-24 DIAGNOSIS — Z91048 Other nonmedicinal substance allergy status: Secondary | ICD-10-CM | POA: Diagnosis not present

## 2015-08-24 DIAGNOSIS — Z23 Encounter for immunization: Secondary | ICD-10-CM

## 2015-08-24 DIAGNOSIS — Z1239 Encounter for other screening for malignant neoplasm of breast: Secondary | ICD-10-CM | POA: Diagnosis not present

## 2015-08-24 DIAGNOSIS — M810 Age-related osteoporosis without current pathological fracture: Secondary | ICD-10-CM

## 2015-08-24 DIAGNOSIS — Z0000 Encounter for general adult medical examination without abnormal findings: Secondary | ICD-10-CM

## 2015-08-24 DIAGNOSIS — K219 Gastro-esophageal reflux disease without esophagitis: Secondary | ICD-10-CM | POA: Diagnosis not present

## 2015-08-24 DIAGNOSIS — Z9109 Other allergy status, other than to drugs and biological substances: Secondary | ICD-10-CM

## 2015-08-24 DIAGNOSIS — F439 Reaction to severe stress, unspecified: Secondary | ICD-10-CM

## 2015-08-24 DIAGNOSIS — Z658 Other specified problems related to psychosocial circumstances: Secondary | ICD-10-CM

## 2015-08-24 MED ORDER — AZELASTINE HCL 0.1 % NA SOLN: 1.0000 | Freq: Two times a day (BID) | NASAL | Status: DC

## 2015-08-24 NOTE — Progress Notes (Signed)
Pre-visit discussion using our clinic review tool. No additional management support is needed unless otherwise documented below in the visit note.  

## 2015-08-24 NOTE — Progress Notes (Signed)
Patient ID: Sabrina Cervantes, female   DOB: [birth year], 81 y.o.   MRN: [id number]   Subjective:    Patient ID: Sabrina Cervantes, female    DOB: 30-Sep-1929, 81 y.o.   MRN: [id number]  HPI  Patient with past history of hypercholesterolemia, osteoporosis and allergies.  She comes in today to follow up on these issues as well as for a complete physical exam.  Her son lives with her.  She cooks for him.  Allergy symptoms controlled.  No chest pain or tightness.  No sob.  No acid reflux.  No abdominal pain or cramping.  Bowels stable.  Left hip and knee pain.  Discussed further w/up and evaluation.  She declines.  Discussed staying active and stretches.     Past Medical History  Diagnosis Date  . Hypercholesterolemia   . Osteoporosis   . Basal cell carcinoma   . Anemia   . Vasomotor rhinitis     chronic  . Nephrolithiasis     pyelonephritis, s/p ureteral stent   Past Surgical History  Procedure Laterality Date  . Lithotripsy      Dr Yves Dill, nephrolithiasis  . Abdominal hysterectomy  1968    secondary to prolapse, ovaries not removed   Family History  Problem Relation Age of Onset  . Skin cancer Father   . Colon cancer Brother   . Hypertension Sister   . Diabetes Mellitus II Sister   . Breast cancer      niece   Social History   Social History  . Marital Status: Married    Spouse Name: N/A  . Number of Children: 4  . Years of Education: N/A   Social History Main Topics  . Smoking status: Never Smoker   . Smokeless tobacco: Never Used  . Alcohol Use: No  . Drug Use: No  . Sexual Activity: Not Asked   Other Topics Concern  . None   Social History Narrative    Outpatient Encounter Prescriptions as of 08/24/2015  Medication Sig  . ALPRAZolam (XANAX) 0.25 MG tablet TAKE ONE TABLET BY MOUTH ONCE DAILY AS NEEDED  . azelastine (ASTELIN) 0.1 % nasal spray Place 1 spray into both nostrils 2 (two) times daily. Use in each nostril as directed  . Calcium Carbonate-Vitamin D (CALCIUM 600+D)  600-400 MG-UNIT per tablet Take 1 tablet by mouth 2 (two) times daily.  . Cholecalciferol (VITAMIN D-3) 1000 UNITS CAPS Take 1 capsule by mouth daily.  Marland Kitchen CRANBERRY PO Take 1 tablet by mouth daily.  . fish oil-omega-3 fatty acids 1000 MG capsule Take 1 g by mouth daily.  . fluticasone (FLONASE) 50 MCG/ACT nasal spray Place 2 sprays into both nostrils daily.  Marland Kitchen levocetirizine (XYZAL) 5 MG tablet Take 1 tablet (5 mg total) by mouth every evening.  . pantoprazole (PROTONIX) 40 MG tablet TAKE ONE TABLET BY MOUTH ONCE DAILY  . simvastatin (ZOCOR) 40 MG tablet TAKE ONE TABLET BY MOUTH IN THE EVENING  . [DISCONTINUED] amoxicillin (AMOXIL) 875 MG tablet Take 1 tablet (875 mg total) by mouth 2 (two) times daily.  . [DISCONTINUED] azelastine (ASTELIN) 0.1 % nasal spray Place 1 spray into both nostrils 2 (two) times daily. Use in each nostril as directed  . [DISCONTINUED] simvastatin (ZOCOR) 40 MG tablet TAKE ONE TABLET BY MOUTH IN THE EVENING   No facility-administered encounter medications on file as of 08/24/2015.    Review of Systems  Constitutional: Negative for appetite change and unexpected weight change.  HENT: Negative for congestion  and sinus pressure.   Eyes: Negative for discharge and visual disturbance.  Respiratory: Negative for cough, chest tightness and shortness of breath.   Cardiovascular: Negative for chest pain, palpitations and leg swelling.  Gastrointestinal: Negative for nausea, vomiting, abdominal pain and diarrhea.  Genitourinary: Negative for dysuria and difficulty urinating.  Musculoskeletal: Negative for back pain.       Some knee pain and left hip pain as outlined.    Skin: Negative for color change and rash.  Neurological: Negative for dizziness, light-headedness and headaches.  Psychiatric/Behavioral: Negative for dysphoric mood and agitation.       Objective:     Blood pressure rechecked by me:  132/72  Physical Exam  Constitutional: She is oriented to person,  place, and time. She appears well-developed and well-nourished. No distress.  HENT:  Nose: Nose normal.  Mouth/Throat: Oropharynx is clear and moist.  Eyes: Right eye exhibits no discharge. Left eye exhibits no discharge. No scleral icterus.  Neck: Neck supple. No thyromegaly present.  Cardiovascular: Normal rate and regular rhythm.   Pulmonary/Chest: Breath sounds normal. No accessory muscle usage. No tachypnea. No respiratory distress. She has no decreased breath sounds. She has no wheezes. She has no rhonchi. Right breast exhibits no inverted nipple, no mass, no nipple discharge and no tenderness (no axillary adenopathy). Left breast exhibits no inverted nipple, no mass, no nipple discharge and no tenderness (no axilarry adenopathy).  Abdominal: Soft. Bowel sounds are normal. There is no tenderness.  Musculoskeletal: She exhibits no edema or tenderness.  Lymphadenopathy:    She has no cervical adenopathy.  Neurological: She is alert and oriented to person, place, and time.  Skin: Skin is warm. No rash noted. No erythema.  Psychiatric: She has a normal mood and affect. Her behavior is normal.    BP 110/58 mmHg  Pulse 79  Temp(Src) 98.1 F (36.7 C) (Oral)  Resp 18  Ht 5\' 2"  (1.575 m)  Wt 138 lb 6 oz (62.766 kg)  BMI 25.30 kg/m2  SpO2 98% Wt Readings from Last 3 Encounters:  08/24/15 138 lb 6 oz (62.766 kg)  04/12/15 143 lb (64.864 kg)  09/27/14 140 lb 4 oz (63.617 kg)     Lab Results  Component Value Date   WBC 7.0 04/20/2015   HGB 12.7 04/20/2015   HCT 38.6 04/20/2015   PLT 285.0 04/20/2015   GLUCOSE 98 04/20/2015   CHOL 168 04/20/2015   TRIG 138.0 04/20/2015   HDL 41.70 04/20/2015   LDLDIRECT 109.2 07/13/2013   LDLCALC 99 04/20/2015   ALT 11 04/20/2015   AST 17 04/20/2015   NA 140 04/20/2015   K 4.3 04/20/2015   CL 107 04/20/2015   CREATININE 0.83 04/20/2015   BUN 15 04/20/2015   CO2 29 04/20/2015   TSH 3.36 04/20/2015       Assessment & Plan:   Problem  List Items Addressed This Visit    Environmental allergies    Stable on current regimen.  Follow.        GERD (gastroesophageal reflux disease)    Symptoms controlled on protonix.  Follow.        Health care maintenance    Physical today 08/24/15.  Mammogram 08/23/14 - Birads I.  Schedule f/u mammogram.        Hypercholesterolemia    On simvastatin.  Low cholesterol diet and exercise.  Follow lipid panel and liver function tests.        Relevant Orders   Comprehensive metabolic panel  Lipid panel   Osteoporosis    Desires not to take bisphosphonates.  Continue calcium and vitamin D supplements.  Follow.        Stress    Discussed with her today.  Does not feel she needs any further intervention.  Follow.         Other Visit Diagnoses    Breast cancer screening    -  Primary    Relevant Orders    MM DIGITAL SCREENING BILATERAL    Encounter for immunization            Einar Pheasant, MD

## 2015-08-27 ENCOUNTER — Encounter: Payer: Self-pay | Admitting: Internal Medicine

## 2015-08-27 NOTE — Assessment & Plan Note (Signed)
Symptoms controlled on protonix.  Follow.   

## 2015-08-27 NOTE — Assessment & Plan Note (Signed)
On simvastatin.  Low cholesterol diet and exercise.  Follow lipid panel and liver function tests.   

## 2015-08-27 NOTE — Assessment & Plan Note (Signed)
Stable on current regimen.  Follow.   

## 2015-08-27 NOTE — Assessment & Plan Note (Signed)
Discussed with her today.  Does not feel she needs any further intervention.  Follow.

## 2015-08-27 NOTE — Assessment & Plan Note (Signed)
Desires not to take bisphosphonates.  Continue calcium and vitamin D supplements.  Follow.

## 2015-08-27 NOTE — Assessment & Plan Note (Signed)
Physical today 08/24/15.  Mammogram 08/23/14 - Bir[facility] I.  Schedule f/u mammogram.

## 2015-08-31 ENCOUNTER — Ambulatory Visit
Admission: RE | Admit: 2015-08-31 | Discharge: 2015-08-31 | Disposition: A | Discharge status: Home / Self Care | Payer: Medicare Other | Source: Ambulatory Visit | Attending: Internal Medicine | Admitting: Internal Medicine

## 2015-08-31 ENCOUNTER — Other Ambulatory Visit: Payer: Self-pay | Admitting: Internal Medicine

## 2015-08-31 DIAGNOSIS — Z1239 Encounter for other screening for malignant neoplasm of breast: Secondary | ICD-10-CM

## 2015-08-31 DIAGNOSIS — Z1231 Encounter for screening mammogram for malignant neoplasm of breast: Secondary | ICD-10-CM | POA: Diagnosis not present

## 2015-09-18 ENCOUNTER — Other Ambulatory Visit (INDEPENDENT_AMBULATORY_CARE_PROVIDER_SITE_OTHER): Payer: Medicare Other

## 2015-09-18 DIAGNOSIS — E7800 Pure hypercholesterolemia, unspecified: Secondary | ICD-10-CM | POA: Diagnosis not present

## 2015-09-18 LAB — COMPREHENSIVE METABOLIC PANEL
ALT: 14 U/L (ref 0–35)
AST: 16 U/L (ref 0–37)
Albumin: 4.1 g/dL (ref 3.5–5.2)
Alkaline Phosphatase: 73 U/L (ref 39–117)
BUN: 10 mg/dL (ref 6–23)
CHLORIDE: 109 meq/L (ref 96–112)
CO2: 29 meq/L (ref 19–32)
Calcium: 10 mg/dL (ref 8.4–10.5)
Creatinine, Ser: 0.75 mg/dL (ref 0.40–1.20)
GFR: 78.08 mL/min (ref >60.00)
GLUCOSE: 95 mg/dL (ref 70–99)
POTASSIUM: 4.1 meq/L (ref 3.5–5.1)
SODIUM: 143 meq/L (ref 135–145)
Total Bilirubin: 0.7 mg/dL (ref 0.2–1.2)
Total Protein: 6.3 g/dL (ref 6.0–8.3)

## 2015-09-18 LAB — LIPID PANEL
Cholesterol: 153 mg/dL (ref 0–200)
HDL: 41 mg/dL (ref >39.00)
LDL CALC: 80 mg/dL (ref 0–99)
NONHDL: 111.91
Total CHOL/HDL Ratio: 4
Triglycerides: 162 mg/dL — ABNORMAL HIGH (ref 0.0–149.0)
VLDL: 32.4 mg/dL (ref 0.0–40.0)

## 2015-09-19 ENCOUNTER — Encounter: Payer: Self-pay | Admitting: *Deleted

## 2015-10-28 ENCOUNTER — Other Ambulatory Visit: Payer: Self-pay | Admitting: Internal Medicine

## 2015-10-31 ENCOUNTER — Other Ambulatory Visit: Payer: Self-pay | Admitting: Internal Medicine

## 2015-10-31 NOTE — Telephone Encounter (Signed)
Faxed

## 2015-10-31 NOTE — Telephone Encounter (Signed)
ok'd refill alprazolam #30 with no refills.   rx signed and placed at your desk.

## 2015-10-31 NOTE — Telephone Encounter (Signed)
Advise? Refilled in September with no refills.

## 2015-11-01 ENCOUNTER — Encounter: Payer: Self-pay | Admitting: *Deleted

## 2015-12-12 DIAGNOSIS — H40003 Preglaucoma, unspecified, bilateral: Secondary | ICD-10-CM | POA: Diagnosis not present

## 2015-12-19 DIAGNOSIS — H02059 Trichiasis without entropian unspecified eye, unspecified eyelid: Secondary | ICD-10-CM | POA: Diagnosis not present

## 2015-12-19 DIAGNOSIS — H40003 Preglaucoma, unspecified, bilateral: Secondary | ICD-10-CM | POA: Diagnosis not present

## 2015-12-29 ENCOUNTER — Ambulatory Visit: Payer: Medicare Other | Admitting: Internal Medicine

## 2016-02-15 ENCOUNTER — Ambulatory Visit (INDEPENDENT_AMBULATORY_CARE_PROVIDER_SITE_OTHER): Payer: Medicare Other | Admitting: Internal Medicine

## 2016-02-15 ENCOUNTER — Encounter: Payer: Self-pay | Admitting: Internal Medicine

## 2016-02-15 VITALS — BP 133/68 | HR 68 | Temp 98.2°F | Ht 62.0 in | Wt 140.2 lb

## 2016-02-15 DIAGNOSIS — Z91048 Other nonmedicinal substance allergy status: Secondary | ICD-10-CM

## 2016-02-15 DIAGNOSIS — F439 Reaction to severe stress, unspecified: Secondary | ICD-10-CM

## 2016-02-15 DIAGNOSIS — Z658 Other specified problems related to psychosocial circumstances: Secondary | ICD-10-CM | POA: Diagnosis not present

## 2016-02-15 DIAGNOSIS — K219 Gastro-esophageal reflux disease without esophagitis: Secondary | ICD-10-CM

## 2016-02-15 DIAGNOSIS — E7800 Pure hypercholesterolemia, unspecified: Secondary | ICD-10-CM | POA: Diagnosis not present

## 2016-02-15 DIAGNOSIS — Z9109 Other allergy status, other than to drugs and biological substances: Secondary | ICD-10-CM

## 2016-02-15 DIAGNOSIS — E559 Vitamin D deficiency, unspecified: Secondary | ICD-10-CM

## 2016-02-15 MED ORDER — PANTOPRAZOLE SODIUM 40 MG PO TBEC: 40.0000 mg | DELAYED_RELEASE_TABLET | Freq: Every day | ORAL | Status: DC

## 2016-02-15 NOTE — Progress Notes (Signed)
Patient ID: KEMYA PHILBERT, female   DOB: 12-28-1928, 82 y.o.   MRN: [id number]   Subjective:    Patient ID: [name], female    DOB: [birth year], 82 y.o.   MRN: [id number]  HPI  Patient here for a scheduled follow up.  She has been having her eyelashes pulled every 6 weeks.  Seeing opthalmology.  Using an eye ointment.  Some allergy symptoms.  Appear to be reasonably controlled.  Some left discomfort at times.  Left hip better.  Desires no further intervention for her knee.  No chest pain.  No sob.  No acid reflux.  No abdominal pain or cramping.  Bowels stable.  Handling stress.     Past Medical History  Diagnosis Date  . Hypercholesterolemia   . Osteoporosis   . Anemia   . Vasomotor rhinitis     chronic  . Nephrolithiasis     pyelonephritis, s/p ureteral stent  . Basal cell carcinoma    Past Surgical History  Procedure Laterality Date  . Lithotripsy      Dr Yves Dill, nephrolithiasis  . Abdominal hysterectomy  1968    secondary to prolapse, ovaries not removed   Family History  Problem Relation Age of Onset  . Skin cancer Father   . Colon cancer Brother   . Hypertension Sister   . Diabetes Mellitus II Sister   . Breast cancer Sister 48  . Breast cancer      niece   Social History   Social History  . Marital Status: Married    Spouse Name: N/A  . Number of Children: 4  . Years of Education: N/A   Social History Main Topics  . Smoking status: Never Smoker   . Smokeless tobacco: Never Used  . Alcohol Use: No  . Drug Use: No  . Sexual Activity: Not Asked   Other Topics Concern  . None   Social History Narrative    Outpatient Encounter Prescriptions as of 02/15/2016  Medication Sig  . ALPRAZolam (XANAX) 0.25 MG tablet TAKE ONE TABLET BY MOUTH ONCE DAILY AS NEEDED  . azelastine (ASTELIN) 0.1 % nasal spray Place 1 spray into both nostrils 2 (two) times daily. Use in each nostril as directed  . Calcium Carbonate-Vitamin D (CALCIUM 600+D) 600-400 MG-UNIT per tablet Take 1  tablet by mouth 2 (two) times daily.  . Cholecalciferol (VITAMIN D-3) 1000 UNITS CAPS Take 1 capsule by mouth daily.  Marland Kitchen CRANBERRY PO Take 1 tablet by mouth daily.  . fish oil-omega-3 fatty acids 1000 MG capsule Take 1 g by mouth daily.  . fluticasone (FLONASE) 50 MCG/ACT nasal spray Place 2 sprays into both nostrils daily.  Marland Kitchen levocetirizine (XYZAL) 5 MG tablet Take 1 tablet (5 mg total) by mouth every evening.  . pantoprazole (PROTONIX) 40 MG tablet Take 1 tablet (40 mg total) by mouth daily.  . simvastatin (ZOCOR) 40 MG tablet TAKE ONE TABLET BY MOUTH IN THE EVENING  . [DISCONTINUED] pantoprazole (PROTONIX) 40 MG tablet TAKE ONE TABLET BY MOUTH ONCE DAILY   No facility-administered encounter medications on file as of 02/15/2016.    Review of Systems  Constitutional: Negative for appetite change and unexpected weight change.  HENT: Positive for congestion. Negative for sinus pressure.   Eyes: Negative for redness.       Seeing opthalmology.  Having her eyelashes pulled.  Respiratory: Negative for cough, chest tightness and shortness of breath.   Cardiovascular: Negative for chest pain, palpitations and leg swelling.  Gastrointestinal:  Negative for nausea, vomiting, abdominal pain and diarrhea.  Genitourinary: Negative for dysuria and difficulty urinating.  Musculoskeletal: Negative for back pain and joint swelling.  Skin: Negative for color change and rash.  Neurological: Negative for dizziness and headaches.  Psychiatric/Behavioral: Negative for dysphoric mood and agitation.       Objective:    Physical Exam  Constitutional: She appears well-developed and well-nourished. No distress.  HENT:  Nose: Nose normal.  Mouth/Throat: Oropharynx is clear and moist.  Neck: Neck supple. No thyromegaly present.  Cardiovascular: Normal rate and regular rhythm.   Pulmonary/Chest: Breath sounds normal. No respiratory distress. She has no wheezes.  Abdominal: Soft. Bowel sounds are normal.  There is no tenderness.  Musculoskeletal: She exhibits no edema or tenderness.  Lymphadenopathy:    She has no cervical adenopathy.  Skin: No rash noted. No erythema.  Psychiatric: She has a normal mood and affect. Her behavior is normal.    BP 133/68 mmHg  Pulse 68  Temp(Src) 98.2 F (36.8 C) (Oral)  Ht 5\' 2"  (1.575 m)  Wt 140 lb 4 oz (63.617 kg)  BMI 25.65 kg/m2  SpO2 98% Wt Readings from Last 3 Encounters:  02/15/16 140 lb 4 oz (63.617 kg)  08/24/15 138 lb 6 oz (62.766 kg)  04/12/15 143 lb (64.864 kg)     Lab Results  Component Value Date   WBC 7.0 04/20/2015   HGB 12.7 04/20/2015   HCT 38.6 04/20/2015   PLT 285.0 04/20/2015   GLUCOSE 95 09/18/2015   CHOL 153 09/18/2015   TRIG 162.0* 09/18/2015   HDL 41.00 09/18/2015   LDLDIRECT 109.2 07/13/2013   LDLCALC 80 09/18/2015   ALT 14 09/18/2015   AST 16 09/18/2015   NA 143 09/18/2015   K 4.1 09/18/2015   CL 109 09/18/2015   CREATININE 0.75 09/18/2015   BUN 10 09/18/2015   CO2 29 09/18/2015   TSH 3.36 04/20/2015    Mm Digital Screening Bilateral  08/31/2015  CLINICAL DATA:  Screening. EXAM: DIGITAL SCREENING BILATERAL MAMMOGRAM WITH CAD COMPARISON:  Previous exam(s). ACR Breast Density Category b: There are scattered areas of fibroglandular density. FINDINGS: There are no findings suspicious for malignancy. Images were processed with CAD. IMPRESSION: No mammographic evidence of malignancy. A result letter of this screening mammogram will be mailed directly to the patient. RECOMMENDATION: Screening mammogram in one year. (Code:SM-B-01Y) BI-RADS CATEGORY  1: Negative. Electronically Signed   By: Evangeline Dakin M.D.   On: 08/31/2015 15:04       Assessment & Plan:   Problem List Items Addressed This Visit    Environmental allergies    Appears to be reasonably controlled.  Follow.        GERD (gastroesophageal reflux disease) - Primary    On protonix.  Symptoms controlled.        Relevant Medications    pantoprazole (PROTONIX) 40 MG tablet   Hypercholesterolemia    On simvastatin.  Low cholesterol diet.  Follow lipid panel and liver function tests.        Relevant Orders   Lipid panel   Comprehensive metabolic panel   Stress    Overall doing ok.  Handling stress.  Follow.        Vitamin D deficiency    Vitamin D supplements.  Follow vitamin D level.       Relevant Orders   VITAMIN D 25 Hydroxy (Vit-D Deficiency, Fractures)       Einar Pheasant, MD

## 2016-02-17 ENCOUNTER — Encounter: Payer: Self-pay | Admitting: Internal Medicine

## 2016-02-17 DIAGNOSIS — E559 Vitamin D deficiency, unspecified: Secondary | ICD-10-CM | POA: Insufficient documentation

## 2016-02-17 NOTE — Assessment & Plan Note (Signed)
On simvastatin.  Low cholesterol diet.  Follow lipid panel and liver function tests.   

## 2016-02-17 NOTE — Assessment & Plan Note (Signed)
On protonix.  Symptoms controlled.   

## 2016-02-17 NOTE — Assessment & Plan Note (Signed)
Overall doing ok.  Handling stress.  Follow.

## 2016-02-17 NOTE — Assessment & Plan Note (Signed)
Vitamin D supplements.  Follow vitamin D level.  

## 2016-02-17 NOTE — Assessment & Plan Note (Signed)
Appears to be reasonably controlled.  Follow.

## 2016-02-22 ENCOUNTER — Telehealth: Payer: Self-pay | Admitting: Internal Medicine

## 2016-02-22 NOTE — Telephone Encounter (Signed)
Pt called needing a refill on ALPRAZolam (XANAX) 0.25 MG tablet sent to [facility] [id number], [place].Marland Kitchen Please advise pt when completed

## 2016-02-23 MED ORDER — ALPRAZOLAM 0.25 MG PO TABS: ORAL_TABLET | ORAL | Status: DC

## 2016-02-23 NOTE — Telephone Encounter (Signed)
Refilled per request.

## 2016-03-28 ENCOUNTER — Other Ambulatory Visit (INDEPENDENT_AMBULATORY_CARE_PROVIDER_SITE_OTHER): Payer: Medicare Other

## 2016-03-28 DIAGNOSIS — E7800 Pure hypercholesterolemia, unspecified: Secondary | ICD-10-CM | POA: Diagnosis not present

## 2016-03-28 DIAGNOSIS — E559 Vitamin D deficiency, unspecified: Secondary | ICD-10-CM | POA: Diagnosis not present

## 2016-03-28 LAB — LIPID PANEL
Cholesterol: 161 mg/dL (ref 0–200)
HDL: 43.7 mg/dL (ref >39.00)
LDL Cholesterol: 90 mg/dL (ref 0–99)
NonHDL: 116.98
TRIGLYCERIDES: 133 mg/dL (ref 0.0–149.0)
Total CHOL/HDL Ratio: 4
VLDL: 26.6 mg/dL (ref 0.0–40.0)

## 2016-03-28 LAB — COMPREHENSIVE METABOLIC PANEL
ALBUMIN: 4.4 g/dL (ref 3.5–5.2)
ALK PHOS: 64 U/L (ref 39–117)
ALT: 13 U/L (ref 0–35)
AST: 17 U/L (ref 0–37)
BILIRUBIN TOTAL: 0.8 mg/dL (ref 0.2–1.2)
BUN: 9 mg/dL (ref 6–23)
CALCIUM: 10.7 mg/dL — AB (ref 8.4–10.5)
CO2: 32 mEq/L (ref 19–32)
Chloride: 108 mEq/L (ref 96–112)
Creatinine, Ser: 0.75 mg/dL (ref 0.40–1.20)
GFR: 77.99 mL/min (ref >60.00)
GLUCOSE: 92 mg/dL (ref 70–99)
Potassium: 4.3 mEq/L (ref 3.5–5.1)
Sodium: 143 mEq/L (ref 135–145)
TOTAL PROTEIN: 6 g/dL (ref 6.0–8.3)

## 2016-03-28 LAB — VITAMIN D 25 HYDROXY (VIT D DEFICIENCY, FRACTURES): VITD: 87.38 ng/mL (ref 30.00–100.00)

## 2016-03-29 ENCOUNTER — Other Ambulatory Visit: Payer: Self-pay | Admitting: Internal Medicine

## 2016-03-29 NOTE — Progress Notes (Signed)
Orders placed for future labs.  

## 2016-04-04 ENCOUNTER — Other Ambulatory Visit (INDEPENDENT_AMBULATORY_CARE_PROVIDER_SITE_OTHER): Payer: Medicare Other

## 2016-04-04 LAB — CALCIUM: CALCIUM: 11 mg/dL — AB (ref 8.4–10.5)

## 2016-04-05 LAB — CALCIUM, IONIZED: Calcium, Ion: 6.2 mg/dL — ABNORMAL HIGH (ref 4.8–5.6)

## 2016-04-06 ENCOUNTER — Other Ambulatory Visit: Payer: Self-pay | Admitting: Internal Medicine

## 2016-04-06 DIAGNOSIS — E559 Vitamin D deficiency, unspecified: Secondary | ICD-10-CM

## 2016-04-06 NOTE — Progress Notes (Signed)
Orders placed for labs

## 2016-04-15 ENCOUNTER — Other Ambulatory Visit: Payer: Medicare Other

## 2016-04-17 ENCOUNTER — Other Ambulatory Visit (INDEPENDENT_AMBULATORY_CARE_PROVIDER_SITE_OTHER): Payer: Medicare Other

## 2016-04-17 DIAGNOSIS — E559 Vitamin D deficiency, unspecified: Secondary | ICD-10-CM | POA: Diagnosis not present

## 2016-04-17 LAB — CBC WITH DIFFERENTIAL/PLATELET
BASOS PCT: 0.8 % (ref 0.0–3.0)
Basophils Absolute: 0 10*3/uL (ref 0.0–0.1)
EOS ABS: 0.1 10*3/uL (ref 0.0–0.7)
EOS PCT: 2.6 % (ref 0.0–5.0)
HEMATOCRIT: 36.3 % (ref 36.0–46.0)
HEMOGLOBIN: 12.1 g/dL (ref 12.0–15.0)
LYMPHS PCT: 39.7 % (ref 12.0–46.0)
Lymphs Abs: 2.2 10*3/uL (ref 0.7–4.0)
MCHC: 33.2 g/dL (ref 30.0–36.0)
MCV: 89 fl (ref 78.0–100.0)
MONOS PCT: 7.8 % (ref 3.0–12.0)
Monocytes Absolute: 0.4 10*3/uL (ref 0.1–1.0)
Neutro Abs: 2.7 10*3/uL (ref 1.4–7.7)
Neutrophils Relative %: 49.1 % (ref 43.0–77.0)
Platelets: 275 10*3/uL (ref 150.0–400.0)
RBC: 4.08 Mil/uL (ref 3.87–5.11)
RDW: 14.2 % (ref 11.5–15.5)
WBC: 5.6 10*3/uL (ref 4.0–10.5)

## 2016-04-17 LAB — VITAMIN D 25 HYDROXY (VIT D DEFICIENCY, FRACTURES): VITD: 92.1 ng/mL (ref 30.00–100.00)

## 2016-04-17 NOTE — Addendum Note (Signed)
Addended by: Frutoso Chase A on: 04/17/2016 10:18 AM   Modules accepted: Orders

## 2016-04-19 LAB — PARATHYROID HORMONE, INTACT (NO CA): PTH: 92 pg/mL — AB (ref 14–64)

## 2016-04-23 LAB — PROTEIN ELECTROPHORESIS,RANDOM URN
Creatinine, Urine: 92 mg/dL (ref 20–320)
PROTEIN CREATININE RATIO: 130 mg/g{creat} (ref 21–161)
TOTAL PROTEIN, URINE: 12 mg/dL (ref 5–24)

## 2016-04-24 LAB — PROTEIN ELECTROPHORESIS, SERUM
Albumin ELP: 4 g/dL (ref 3.8–4.8)
Alpha-1-Globulin: 0.3 g/dL (ref 0.2–0.3)
Alpha-2-Globulin: 0.7 g/dL (ref 0.5–0.9)
BETA 2: 0.2 g/dL (ref 0.2–0.5)
BETA GLOBULIN: 0.4 g/dL (ref 0.4–0.6)
Gamma Globulin: 0.6 g/dL — ABNORMAL LOW (ref 0.8–1.7)
TOTAL PROTEIN, SERUM ELECTROPHOR: 6.2 g/dL (ref 6.1–8.1)

## 2016-04-26 ENCOUNTER — Telehealth: Payer: Self-pay | Admitting: *Deleted

## 2016-04-26 NOTE — Telephone Encounter (Signed)
Spoke to patient in guards to her labs that Dr. Nicki Reaper is awaiting final test results.  Patient stated that she has a UTI she would like medication for it.  Patient has a history of UTIs per patient.  She doesn't want to wait till Monday to speak to Dr. Nicki Reaper. Please advise

## 2016-04-26 NOTE — Telephone Encounter (Signed)
Patient has requested lab results from 04/17/16 Pt contact [phone] Patient sister stated that patient has a Uti? Could she have a antibiotic called into the pharmacy.

## 2016-04-26 NOTE — Telephone Encounter (Signed)
Needs to be seen if she feels that she is having UTI symptoms.

## 2016-04-26 NOTE — Telephone Encounter (Signed)
Left message to return call 

## 2016-04-29 ENCOUNTER — Telehealth: Payer: Self-pay | Admitting: Internal Medicine

## 2016-04-29 DIAGNOSIS — N390 Urinary tract infection, site not specified: Secondary | ICD-10-CM

## 2016-04-29 NOTE — Telephone Encounter (Signed)
Spoke with patient.  Dr. Nicki Reaper notified of concerns.  Awaiting feedback.

## 2016-04-29 NOTE — Telephone Encounter (Signed)
Notified patient and she verbalized understanding.  States her sister Lelon Frohlich will call in the morning to schedule an appointment for her to be seen.

## 2016-04-29 NOTE — Telephone Encounter (Signed)
See previous staff message you sent me.  If having uti symptoms, needs to be seen.  This was urine that was done to f/u on her elevated calcium.

## 2016-04-29 NOTE — Telephone Encounter (Signed)
Patient's sister called stating that the patient has not received results from her labs that were done on 6.14.17. Her sister informed me that the patient is needing something called into the pharmacy for a UTI. The patient is still experiencing pain.

## 2016-04-30 ENCOUNTER — Ambulatory Visit (INDEPENDENT_AMBULATORY_CARE_PROVIDER_SITE_OTHER): Payer: Medicare Other | Admitting: Family

## 2016-04-30 ENCOUNTER — Encounter: Payer: Self-pay | Admitting: Family

## 2016-04-30 VITALS — BP 120/64 | HR 63 | Temp 98.3°F | Wt 138.4 lb

## 2016-04-30 DIAGNOSIS — N390 Urinary tract infection, site not specified: Secondary | ICD-10-CM | POA: Diagnosis not present

## 2016-04-30 LAB — POC URINALSYSI DIPSTICK (AUTOMATED)
Bilirubin, UA: NEGATIVE
Glucose, UA: NEGATIVE
KETONES UA: NEGATIVE
NITRITE UA: POSITIVE
PH UA: 5.5
PROTEIN UA: 100
Spec Grav, UA: 1.015
UROBILINOGEN UA: 0.2

## 2016-04-30 MED ORDER — SULFAMETHOXAZOLE-TRIMETHOPRIM 800-160 MG PO TABS: 1.0000 | ORAL_TABLET | Freq: Two times a day (BID) | ORAL | Status: DC

## 2016-04-30 NOTE — Patient Instructions (Signed)
Drink plenty of water and take antibiotic as prescribed.   We are pending the urine culture to know the organism causing the infection and our office will call you with results. If the particular organism requires a different antibiotic than the on prescribed, we will place an order for a new prescription at that time.   If you symptoms worsen or you have new symptoms, please contact our office, or return to clinic for re evaluation.  Urinary Tract Infection Urinary tract infections (UTIs) can develop anywhere along your urinary tract. Your urinary tract is your body's drainage system for removing wastes and extra water. Your urinary tract includes two kidneys, two ureters, a bladder, and a urethra. Your kidneys are a pair of bean-shaped organs. Each kidney is about the size of your fist. They are located below your ribs, one on each side of your spine. CAUSES Infections are caused by microbes, which are microscopic organisms, including fungi, viruses, and bacteria. These organisms are so small that they can only be seen through a microscope. Bacteria are the microbes that most commonly cause UTIs. SYMPTOMS  Symptoms of UTIs may vary by age and gender of the patient and by the location of the infection. Symptoms in young women typically include a frequent and intense urge to urinate and a painful, burning feeling in the bladder or urethra during urination. Older women and men are more likely to be tired, shaky, and weak and have muscle aches and abdominal pain. A fever may mean the infection is in your kidneys. Other symptoms of a kidney infection include pain in your back or sides below the ribs, nausea, and vomiting. DIAGNOSIS To diagnose a UTI, your caregiver will ask you about your symptoms. Your caregiver will also ask you to provide a urine sample. The urine sample will be tested for bacteria and white blood cells. White blood cells are made by your body to help fight infection. TREATMENT    Typically, UTIs can be treated with medication. Because most UTIs are caused by a bacterial infection, they usually can be treated with the use of antibiotics. The choice of antibiotic and length of treatment depend on your symptoms and the type of bacteria causing your infection. HOME CARE INSTRUCTIONS  If you were prescribed antibiotics, take them exactly as your caregiver instructs you. Finish the medication even if you feel better after you have only taken some of the medication.  Drink enough water and fluids to keep your urine clear or pale yellow.  Avoid caffeine, tea, and carbonated beverages. They tend to irritate your bladder.  Empty your bladder often. Avoid holding urine for long periods of time.  Empty your bladder before and after sexual intercourse.  After a bowel movement, women should cleanse from front to back. Use each tissue only once. SEEK MEDICAL CARE IF:   You have back pain.  You develop a fever.  Your symptoms do not begin to resolve within 3 days. SEEK IMMEDIATE MEDICAL CARE IF:   You have severe back pain or lower abdominal pain.  You develop chills.  You have nausea or vomiting.  You have continued burning or discomfort with urination. MAKE SURE YOU:   Understand these instructions.  Will watch your condition.  Will get help right away if you are not doing well or get worse.   This information is not intended to replace advice given to you by your health care provider. Make sure you discuss any questions you have with your health care   provider.   Document Released: 07/31/2005 Document Revised: 07/12/2015 Document Reviewed: 11/29/2011 Elsevier Interactive Patient Education 2016 Elsevier Inc.    

## 2016-04-30 NOTE — Addendum Note (Signed)
Addended by: Karlene Einstein D on: 04/30/2016 01:29 PM   Modules accepted: Orders

## 2016-04-30 NOTE — Progress Notes (Signed)
Subjective:    Patient ID: Sabrina Cervantes, female    DOB: 10/16/1929, 82 y.o.   MRN: [id number]   Sabrina Cervantes is a 82 y.o. female who presents today for an acute visit.    HPI Comments: Patient here for evaluation of frequency, dysuria for one week.No hematuria. Denies flank pain, fever, chills.  Past Medical History  Diagnosis Date  . Hypercholesterolemia   . Osteoporosis   . Anemia   . Vasomotor rhinitis     chronic  . Nephrolithiasis     pyelonephritis, s/p ureteral stent  . Basal cell carcinoma    Allergies: Pyridium Current Outpatient Prescriptions on File Prior to Visit  Medication Sig Dispense Refill  . ALPRAZolam (XANAX) 0.25 MG tablet TAKE ONE TABLET BY MOUTH ONCE DAILY AS NEEDED 30 tablet 0  . azelastine (ASTELIN) 0.1 % nasal spray Place 1 spray into both nostrils 2 (two) times daily. Use in each nostril as directed 30 mL 1  . Calcium Carbonate-Vitamin D (CALCIUM 600+D) 600-400 MG-UNIT per tablet Take 1 tablet by mouth 2 (two) times daily.    . Cholecalciferol (VITAMIN D-3) 1000 UNITS CAPS Take 1 capsule by mouth daily.    Marland Kitchen CRANBERRY PO Take 1 tablet by mouth daily.    . fish oil-omega-3 fatty acids 1000 MG capsule Take 1 g by mouth daily.    . fluticasone (FLONASE) 50 MCG/ACT nasal spray Place 2 sprays into both nostrils daily. 16 g 3  . levocetirizine (XYZAL) 5 MG tablet Take 1 tablet (5 mg total) by mouth every evening. 30 tablet 3  . pantoprazole (PROTONIX) 40 MG tablet Take 1 tablet (40 mg total) by mouth daily. 90 tablet 1  . simvastatin (ZOCOR) 40 MG tablet TAKE ONE TABLET BY MOUTH IN THE EVENING 90 tablet 3   No current facility-administered medications on file prior to visit.    Social History  Substance Use Topics  . Smoking status: Never Smoker   . Smokeless tobacco: Never Used  . Alcohol Use: No    Review of Systems  Constitutional: Negative for fever and chills.  Respiratory: Negative for cough.   Cardiovascular: Negative for chest pain and  palpitations.  Gastrointestinal: Negative for nausea and vomiting.  Genitourinary: Positive for dysuria and frequency. Negative for flank pain.      Objective:    BP 120/64 mmHg  Pulse 63  Temp(Src) 98.3 F (36.8 C)  Wt 138 lb 6.4 oz (62.778 kg)  SpO2 97%   Physical Exam  Constitutional: She appears well-developed and well-nourished.  Cardiovascular: Normal rate, regular rhythm, normal heart sounds and normal pulses.   Pulmonary/Chest: Effort normal and breath sounds normal. She has no wheezes. She has no rhonchi. She has no rales.  Abdominal: There is no CVA tenderness.  Neurological: She is alert.  Skin: Skin is warm and dry.  Psychiatric: She has a normal mood and affect. Her speech is normal and behavior is normal. Thought content normal.  Vitals reviewed.      Assessment & Plan:   1. Urinary tract infection, site not specified UA positive for nitrites, leukocytes, blood.will treat empirically. Pending culture.  - POCT Urinalysis Dipstick (Automated) - sulfamethoxazole-trimethoprim (BACTRIM DS,SEPTRA DS) 800-160 MG tablet; Take 1 tablet by mouth 2 (two) times daily.  Dispense: 6 tablet; Refill: 0    I am having Ms. Roark start on sulfamethoxazole-trimethoprim. I am also having her maintain her fish oil-omega-3 fatty acids, CRANBERRY PO, Calcium Carbonate-Vitamin D, Vitamin D-3, levocetirizine, fluticasone, simvastatin,  azelastine, pantoprazole, and ALPRAZolam.   Meds ordered this encounter  Medications  . sulfamethoxazole-trimethoprim (BACTRIM DS,SEPTRA DS) 800-160 MG tablet    Sig: Take 1 tablet by mouth 2 (two) times daily.    Dispense:  6 tablet    Refill:  0    Order Specific Question:  Supervising Provider    Answer:  Cassandria Anger [[id number]]     Start medications as prescribed and explained to patient on After Visit Summary ( AVS). Risks, benefits, and alternatives of the medications and treatment plan prescribed today were discussed, and patient  expressed understanding.   Education regarding symptom management and diagnosis given to patient.   Follow-up:Plan follow-up and return precautions given if any worsening symptoms or change in condition.   Continue to follow with Einar Pheasant, MD for routine health maintenance.   [name] and I agreed with plan.   Mable Paris, FNP

## 2016-05-01 LAB — URINE CULTURE: Colony Count: >=100000

## 2016-05-02 MED ORDER — CIPROFLOXACIN HCL 250 MG PO TABS: 250.0000 mg | ORAL_TABLET | Freq: Two times a day (BID) | ORAL | Status: DC

## 2016-05-02 NOTE — Addendum Note (Signed)
Addended by: Burnard Hawthorne on: 05/02/2016 06:19 PM   Modules accepted: Miquel Dunn

## 2016-05-02 NOTE — Telephone Encounter (Signed)
-----   Message from [place], [place] sent at 05/02/2016  5:24 PM EDT ----- Pt was notified of results, she said she hasn't really noticed any improvement with the bactrim she's still having pain while urinating. She said still has 2 doses  Of the bactrim to take.

## 2016-05-02 NOTE — Telephone Encounter (Signed)
Please call patient and let her know that I did not receive a culture and sensitivity report for her urine. Therefore it is reasonable to stop the Bactrim, and start another antibiotic which I have sent to her pharmacy. Please advise her that the the rare side effect of this antibiotic is tendon ruptures if she experiences any leg pain, she stop medication immediately and let us know.

## 2016-05-03 ENCOUNTER — Other Ambulatory Visit: Payer: Self-pay | Admitting: Internal Medicine

## 2016-05-03 NOTE — Telephone Encounter (Signed)
Left message to return call 

## 2016-05-03 NOTE — Progress Notes (Signed)
Opened in error

## 2016-05-05 ENCOUNTER — Other Ambulatory Visit: Payer: Self-pay | Admitting: Internal Medicine

## 2016-05-05 DIAGNOSIS — R7989 Other specified abnormal findings of blood chemistry: Secondary | ICD-10-CM

## 2016-05-05 NOTE — Progress Notes (Signed)
Order placed for sestamibi scan.

## 2016-05-09 ENCOUNTER — Encounter: Admission: RE | Admit: 2016-05-09 | Payer: Medicare Other | Source: Ambulatory Visit

## 2016-05-13 ENCOUNTER — Telehealth: Payer: Self-pay | Admitting: Internal Medicine

## 2016-05-13 NOTE — Telephone Encounter (Signed)
Spoke with the daughter, reviewed labs and plan. thanks

## 2016-05-13 NOTE — Telephone Encounter (Signed)
The daughter wants someone to call her in reference to her mothers labs . Her mother informed her that she has a problem with her thyroid. The daughter wants to know what's going on and what is going to be done next.

## 2016-05-20 ENCOUNTER — Ambulatory Visit: Payer: Medicare Other

## 2016-05-20 ENCOUNTER — Encounter
Admission: RE | Admit: 2016-05-20 | Discharge: 2016-05-20 | Disposition: A | Discharge status: Home / Self Care | Payer: Medicare Other | Source: Ambulatory Visit | Attending: Internal Medicine | Admitting: Internal Medicine

## 2016-05-20 DIAGNOSIS — E215 Disorder of parathyroid gland, unspecified: Secondary | ICD-10-CM | POA: Diagnosis not present

## 2016-05-20 DIAGNOSIS — R7989 Other specified abnormal findings of blood chemistry: Secondary | ICD-10-CM

## 2016-05-20 MED ORDER — TECHNETIUM TC 99M SESTAMIBI - CARDIOLITE
25.4500 | Freq: Once | INTRAVENOUS | Status: AC | PRN
  Administered 2016-05-20: 25.45 via INTRAVENOUS

## 2016-05-23 ENCOUNTER — Telehealth: Payer: Self-pay | Admitting: Internal Medicine

## 2016-05-23 NOTE — Telephone Encounter (Signed)
Pt sister called about getting the MRI scan results from date 07/17. Please advise?  Call sister@ [phone] or [phone]. Thank you!

## 2016-05-23 NOTE — Telephone Encounter (Signed)
Spoke with patient, see result note for details, thanks

## 2016-05-24 ENCOUNTER — Other Ambulatory Visit: Payer: Self-pay | Admitting: Internal Medicine

## 2016-05-24 DIAGNOSIS — D351 Benign neoplasm of parathyroid gland: Secondary | ICD-10-CM

## 2016-05-24 NOTE — Progress Notes (Signed)
Order placed for referral to Dr Harlow Asa.

## 2016-06-13 ENCOUNTER — Ambulatory Visit: Payer: Self-pay | Admitting: Surgery

## 2016-06-13 DIAGNOSIS — E210 Primary hyperparathyroidism: Secondary | ICD-10-CM | POA: Diagnosis not present

## 2016-06-18 ENCOUNTER — Encounter: Payer: Self-pay | Admitting: Internal Medicine

## 2016-06-18 ENCOUNTER — Ambulatory Visit (INDEPENDENT_AMBULATORY_CARE_PROVIDER_SITE_OTHER): Payer: Medicare Other | Admitting: Internal Medicine

## 2016-06-18 VITALS — BP 126/80 | HR 65 | Temp 98.5°F | Resp 17 | Ht 62.0 in | Wt 139.1 lb

## 2016-06-18 DIAGNOSIS — F439 Reaction to severe stress, unspecified: Secondary | ICD-10-CM

## 2016-06-18 DIAGNOSIS — E559 Vitamin D deficiency, unspecified: Secondary | ICD-10-CM

## 2016-06-18 DIAGNOSIS — E210 Primary hyperparathyroidism: Secondary | ICD-10-CM

## 2016-06-18 DIAGNOSIS — E7800 Pure hypercholesterolemia, unspecified: Secondary | ICD-10-CM

## 2016-06-18 DIAGNOSIS — Z91048 Other nonmedicinal substance allergy status: Secondary | ICD-10-CM

## 2016-06-18 DIAGNOSIS — K219 Gastro-esophageal reflux disease without esophagitis: Secondary | ICD-10-CM | POA: Diagnosis not present

## 2016-06-18 DIAGNOSIS — Z658 Other specified problems related to psychosocial circumstances: Secondary | ICD-10-CM

## 2016-06-18 DIAGNOSIS — Z01818 Encounter for other preprocedural examination: Secondary | ICD-10-CM | POA: Diagnosis not present

## 2016-06-18 DIAGNOSIS — M810 Age-related osteoporosis without current pathological fracture: Secondary | ICD-10-CM

## 2016-06-18 DIAGNOSIS — Z9109 Other allergy status, other than to drugs and biological substances: Secondary | ICD-10-CM

## 2016-06-18 NOTE — Progress Notes (Signed)
Pre-visit discussion using our clinic review tool. No additional management support is needed unless otherwise documented below in the visit note.  

## 2016-06-18 NOTE — Progress Notes (Signed)
Patient ID: Sabrina Cervantes, female   DOB: 1929/08/23, 82 y.o.   MRN: [id number]   Subjective:    Patient ID: Sabrina Cervantes, female    DOB: 09-19-1929, 82 y.o.   MRN: [id number]  HPI  Patient here for a scheduled follow up.  Was recently found to have elevated calcium - parathyroid adenoma.  Just evaluated by Dr Harlow Asa 06/13/16.  See his note.  Planning for parathyroidectomy.  States she feels a lot of her symptoms are related to this.  Reports pain from left hip to knee.  Discussed with her today.  She declines further w/up.  Decreased appetite.  No chest pain.  No sob.  No acid reflux.     Past Medical History:  Diagnosis Date  . Anemia   . Basal cell carcinoma   . Hypercholesterolemia   . Nephrolithiasis    pyelonephritis, s/p ureteral stent  . Osteoporosis   . Vasomotor rhinitis    chronic   Past Surgical History:  Procedure Laterality Date  . ABDOMINAL HYSTERECTOMY  1968   secondary to prolapse, ovaries not removed  . LITHOTRIPSY     Dr Yves Dill, nephrolithiasis   Family History  Problem Relation Age of Onset  . Skin cancer Father   . Colon cancer Brother   . Hypertension Sister   . Diabetes Mellitus II Sister   . Breast cancer Sister 42  . Breast cancer      niece   Social History   Social History  . Marital status: Married    Spouse name: N/A  . Number of children: 4  . Years of education: N/A   Social History Main Topics  . Smoking status: Never Smoker  . Smokeless tobacco: Never Used  . Alcohol use No  . Drug use: No  . Sexual activity: Not Asked   Other Topics Concern  . None   Social History Narrative  . None    Outpatient Encounter Prescriptions as of 06/18/2016  Medication Sig  . ALPRAZolam (XANAX) 0.25 MG tablet TAKE ONE TABLET BY MOUTH ONCE DAILY AS NEEDED  . azelastine (ASTELIN) 0.1 % nasal spray Place 1 spray into both nostrils 2 (two) times daily. Use in each nostril as directed  . Calcium Carbonate-Vitamin D (CALCIUM 600+D) 600-400 MG-UNIT per tablet  Take 1 tablet by mouth 2 (two) times daily.  . Cholecalciferol (VITAMIN D-3) 1000 UNITS CAPS Take 1 capsule by mouth daily.  Marland Kitchen CRANBERRY PO Take 1 tablet by mouth daily.  . fish oil-omega-3 fatty acids 1000 MG capsule Take 1 g by mouth daily.  . fluticasone (FLONASE) 50 MCG/ACT nasal spray Place 2 sprays into both nostrils daily.  Marland Kitchen levocetirizine (XYZAL) 5 MG tablet Take 1 tablet (5 mg total) by mouth every evening.  . pantoprazole (PROTONIX) 40 MG tablet Take 1 tablet (40 mg total) by mouth daily.  . simvastatin (ZOCOR) 40 MG tablet TAKE ONE TABLET BY MOUTH IN THE EVENING  . [DISCONTINUED] ciprofloxacin (CIPRO) 250 MG tablet Take 1 tablet (250 mg total) by mouth 2 (two) times daily.  . [DISCONTINUED] sulfamethoxazole-trimethoprim (BACTRIM DS,SEPTRA DS) 800-160 MG tablet Take 1 tablet by mouth 2 (two) times daily.   No facility-administered encounter medications on file as of 06/18/2016.     Review of Systems  Constitutional: Positive for appetite change. Negative for unexpected weight change.       Decreased appetite.    HENT: Negative for congestion and sinus pressure.   Respiratory: Negative for cough, chest tightness and  shortness of breath.   Cardiovascular: Negative for chest pain, palpitations and leg swelling.  Gastrointestinal: Negative for abdominal pain, diarrhea, nausea and vomiting.  Genitourinary: Negative for difficulty urinating and dysuria.  Musculoskeletal: Negative for joint swelling.       Left hip and leg pain - to knee.    Skin: Negative for color change and rash.  Neurological: Negative for dizziness, light-headedness and headaches.  Psychiatric/Behavioral: Negative for agitation and dysphoric mood.       Increased stress.         Objective:    Physical Exam  Constitutional: She appears well-developed and well-nourished. No distress.  HENT:  Nose: Nose normal.  Mouth/Throat: Oropharynx is clear and moist.  Neck: Neck supple. No thyromegaly present.    Cardiovascular: Normal rate and regular rhythm.   Pulmonary/Chest: Breath sounds normal. No respiratory distress. She has no wheezes.  Abdominal: Soft. Bowel sounds are normal. There is no tenderness.  Musculoskeletal: She exhibits no edema or tenderness.  Lymphadenopathy:    She has no cervical adenopathy.  Skin: No rash noted. No erythema.  Psychiatric: She has a normal mood and affect. Her behavior is normal.    BP 126/80   Pulse 65   Temp 98.5 F (36.9 C) (Oral)   Resp 17   Ht 5\' 2"  (1.575 m)   Wt 139 lb 2 oz (63.1 kg)   SpO2 93%   BMI 25.45 kg/m  Wt Readings from Last 3 Encounters:  06/18/16 139 lb 2 oz (63.1 kg)  04/30/16 138 lb 6.4 oz (62.8 kg)  02/15/16 140 lb 4 oz (63.6 kg)     Lab Results  Component Value Date   WBC 5.6 04/17/2016   HGB 12.1 04/17/2016   HCT 36.3 04/17/2016   PLT 275.0 04/17/2016   GLUCOSE 92 03/28/2016   CHOL 161 03/28/2016   TRIG 133.0 03/28/2016   HDL 43.70 03/28/2016   LDLDIRECT 109.2 07/13/2013   LDLCALC 90 03/28/2016   ALT 13 03/28/2016   AST 17 03/28/2016   NA 143 03/28/2016   K 4.3 03/28/2016   CL 108 03/28/2016   CREATININE 0.75 03/28/2016   BUN 9 03/28/2016   CO2 32 03/28/2016   TSH 3.36 04/20/2015    Nm Parathyroid W/spect/ct  Result Date: 05/20/2016 CLINICAL DATA:  Evaluate for parathyroid adenoma. EXAM: NM PARATHYROID SCINTIGRAPHY AND SPECT IMAGING TECHNIQUE: Following intravenous administration of radiopharmaceutical, early and 2-hour delayed planar images were obtained in the anterior projection. Delayed triplanar SPECT images were also obtained at 2 hours. RADIOPHARMACEUTICALS:  25.45 mCi Tc-75m Sestamibi IV COMPARISON:  None. FINDINGS: On the early images there is increased uptake within both lobes of the thyroid gland. Additionally, there is a separate focus of increased uptake below the inferior pole of the left lobe of thyroid gland. On the delayed images there is washout of the radiopharmaceutical from the thyroid  gland. Persistence of activity within the separate focus is identified below the inferior pole of the left lobe of thyroid gland, image number 28 of series 1,001. IMPRESSION: Persistent focus of increased uptake is identified adjacent to the inferior pole of the left lobe of thyroid gland compatible with parathyroid adenoma. Electronically Signed   By: Kerby Moors M.D.   On: 05/20/2016 15:11       Assessment & Plan:   Problem List Items Addressed This Visit    Environmental allergies    Stable on current regimen.  Follow.       GERD (gastroesophageal reflux disease)  Symptoms controlled on protonix.        Hypercholesterolemia    On simvastatin.  Low cholesterol diet and exercise.  Follow lipid panel and liver function tests.        Osteoporosis    Desires not to take prescription medication.  Continue vitamin D supplements.  Dietary calcium.  Weight bearing exercise.  Plan for parathyroidectomy.  Follow.         Primary hyperparathyroidism (HCC)    Saw Dr Harlow Asa.  Planning for parathyroidectomy.  EKG today revealed SR with no acute ischemic changes.  She denies any chest pain or tightness.  No increased sob.  IShe will need close intra op and post op monitoring of her heart rate and blood pressure to avoid extremes.        Stress    Overall she feel she is doing well.  Handling things well.  Follow.        Vitamin D deficiency    Continue vitamin D supplements.         Other Visit Diagnoses    Preoperative clearance    -  Primary   Relevant Orders   EKG 12-Lead (Completed)       Einar Pheasant, MD

## 2016-06-25 ENCOUNTER — Encounter: Payer: Self-pay | Admitting: Internal Medicine

## 2016-06-25 DIAGNOSIS — Z8639 Personal history of other endocrine, nutritional and metabolic disease: Secondary | ICD-10-CM | POA: Insufficient documentation

## 2016-06-25 DIAGNOSIS — E210 Primary hyperparathyroidism: Secondary | ICD-10-CM | POA: Insufficient documentation

## 2016-06-25 NOTE — Assessment & Plan Note (Signed)
Desires not to take prescription medication.  Continue vitamin D supplements.  Dietary calcium.  Weight bearing exercise.  Plan for parathyroidectomy.  Follow.

## 2016-06-25 NOTE — Assessment & Plan Note (Signed)
On simvastatin.  Low cholesterol diet and exercise.  Follow lipid panel and liver function tests.   

## 2016-06-25 NOTE — Assessment & Plan Note (Signed)
Overall she feel she is doing well.  Handling things well.  Follow.

## 2016-06-25 NOTE — Assessment & Plan Note (Signed)
Continue vitamin D supplements.  

## 2016-06-25 NOTE — Assessment & Plan Note (Signed)
Symptoms controlled on protonix.   

## 2016-06-25 NOTE — Assessment & Plan Note (Signed)
Saw Dr Harlow Asa.  Planning for parathyroidectomy.  EKG today revealed SR with no acute ischemic changes.  She denies any chest pain or tightness.  No increased sob.  IShe will need close intra op and post op monitoring of her heart rate and blood pressure to avoid extremes.

## 2016-06-25 NOTE — Assessment & Plan Note (Signed)
Stable on current regimen.  Follow.   

## 2016-07-16 DIAGNOSIS — H353131 Nonexudative age-related macular degeneration, bilateral, early dry stage: Secondary | ICD-10-CM | POA: Diagnosis not present

## 2016-07-16 DIAGNOSIS — H02059 Trichiasis without entropian unspecified eye, unspecified eyelid: Secondary | ICD-10-CM | POA: Diagnosis not present

## 2016-08-06 ENCOUNTER — Telehealth: Payer: Self-pay | Admitting: Internal Medicine

## 2016-08-06 NOTE — Telephone Encounter (Signed)
Pt daughter Sabrina Cervantes called and asked if they could have a print out of current medication sent to the house. Pt is having some pre surgical testing done and they want to make sure they have the most current list. Please advise, thank you!

## 2016-08-06 NOTE — Telephone Encounter (Signed)
mailed

## 2016-08-07 ENCOUNTER — Encounter (HOSPITAL_COMMUNITY): Payer: Self-pay

## 2016-08-07 NOTE — Pre-Procedure Instructions (Signed)
Sabrina Cervantes  08/07/2016     Your procedure is scheduled on : Thursday August 15, 2016 at 11:00 AM.  Report to [facility] Admitting at 9:00 AM.  Call this number if you have problems the morning of surgery: [phone]    Remember:  Do not eat food or drink liquids after midnight.  Take these medicines the morning of surgery with A SIP OF WATER : Alprazolam (Xanax) if needed, Astelin nasal spray, Pantoprazole (Protonix)   Stop taking any vitamins, herbal medications/supplements, NSAIDs, Ibuprofen, Advil, Motrin, Aleve, etc today   Do not wear jewelry, make-up or nail polish.  Do not wear lotions, powders, or perfumes, or deoderant.  Do not shave 48 hours prior to surgery.    Do not bring valuables to the hospital.  [facility] is not responsible for any belongings or valuables.  Contacts, dentures or bridgework may not be worn into surgery.  Leave your suitcase in the car.  After surgery it may be brought to your room.  For patients admitted to the hospital, discharge time will be determined by your treatment team.  Patients discharged the day of surgery will not be allowed to drive home.   Name and phone number of your driver:    Special instructions:  Shower using CHG soap the night before and the morning of your surgery  Please read over the following fact sheets that you were given.

## 2016-08-08 ENCOUNTER — Encounter (HOSPITAL_COMMUNITY)
Admission: RE | Admit: 2016-08-08 | Discharge: 2016-08-08 | Disposition: A | Discharge status: Home / Self Care | Payer: Medicare Other | Source: Ambulatory Visit | Attending: Surgery | Admitting: Surgery

## 2016-08-08 ENCOUNTER — Other Ambulatory Visit: Payer: Self-pay | Admitting: Internal Medicine

## 2016-08-08 ENCOUNTER — Encounter (HOSPITAL_COMMUNITY): Payer: Self-pay

## 2016-08-08 DIAGNOSIS — K219 Gastro-esophageal reflux disease without esophagitis: Secondary | ICD-10-CM | POA: Insufficient documentation

## 2016-08-08 DIAGNOSIS — D649 Anemia, unspecified: Secondary | ICD-10-CM | POA: Diagnosis not present

## 2016-08-08 DIAGNOSIS — Z01812 Encounter for preprocedural laboratory examination: Secondary | ICD-10-CM | POA: Diagnosis not present

## 2016-08-08 DIAGNOSIS — Z79899 Other long term (current) drug therapy: Secondary | ICD-10-CM | POA: Diagnosis not present

## 2016-08-08 DIAGNOSIS — Z23 Encounter for immunization: Secondary | ICD-10-CM | POA: Diagnosis not present

## 2016-08-08 DIAGNOSIS — E785 Hyperlipidemia, unspecified: Secondary | ICD-10-CM | POA: Insufficient documentation

## 2016-08-08 DIAGNOSIS — H9190 Unspecified hearing loss, unspecified ear: Secondary | ICD-10-CM | POA: Insufficient documentation

## 2016-08-08 HISTORY — DX: Unspecified hearing loss, unspecified ear: H91.90

## 2016-08-08 HISTORY — DX: Personal history of urinary calculi: Z87.442

## 2016-08-08 HISTORY — DX: Major depressive disorder, single episode, unspecified: F32.9

## 2016-08-08 HISTORY — DX: Depression, unspecified: F32.A

## 2016-08-08 HISTORY — DX: Gastro-esophageal reflux disease without esophagitis: K21.9

## 2016-08-08 LAB — BASIC METABOLIC PANEL
ANION GAP: 7 (ref 5–15)
BUN: 7 mg/dL (ref 6–20)
CALCIUM: 11.5 mg/dL — AB (ref 8.9–10.3)
CO2: 30 mmol/L (ref 22–32)
Chloride: 105 mmol/L (ref 101–111)
Creatinine, Ser: 0.97 mg/dL (ref 0.44–1.00)
GFR, EST NON AFRICAN AMERICAN: 52 mL/min — AB (ref >60)
Glucose, Bld: 105 mg/dL — ABNORMAL HIGH (ref 65–99)
Potassium: 3.7 mmol/L (ref 3.5–5.1)
SODIUM: 142 mmol/L (ref 135–145)

## 2016-08-08 LAB — CBC
HCT: 37.7 % (ref 36.0–46.0)
HEMOGLOBIN: 11.9 g/dL — AB (ref 12.0–15.0)
MCH: 28.7 pg (ref 26.0–34.0)
MCHC: 31.6 g/dL (ref 30.0–36.0)
MCV: 90.8 fL (ref 78.0–100.0)
PLATELETS: 228 10*3/uL (ref 150–400)
RBC: 4.15 MIL/uL (ref 3.87–5.11)
RDW: 13.9 % (ref 11.5–15.5)
WBC: 6.4 10*3/uL (ref 4.0–10.5)

## 2016-08-08 NOTE — Telephone Encounter (Signed)
Last filled 02/23/16 #30

## 2016-08-08 NOTE — Telephone Encounter (Signed)
Faxed to [facility]

## 2016-08-09 NOTE — Progress Notes (Signed)
Anesthesia Chart Review:  Pt is an 82 year old female scheduled for L inferior parathyroidectomy on 08/15/2016 with Armandina Gemma, MD.   PMH includes:  Hyperlipidemia, anemia, GERD. Hard of hearing. Never smoker. BMI 25.5  Medications include: Protonix, simvastatin  Preoperative labs reviewed.    EKG 06/18/16: Sinus rhythm. Low voltage in precordial leads. Old inferior infarct. Nonspecific T-abnormality. Appears stable when compared to EKG 07/19/13.   If no changes, I anticipate pt can proceed with surgery as scheduled.   Willeen Cass, FNP-BC [facility]/Anesthesiology Phone: [phone] 08/09/2016 3:15 PM

## 2016-08-11 ENCOUNTER — Encounter (HOSPITAL_COMMUNITY): Payer: Self-pay | Admitting: Surgery

## 2016-08-11 NOTE — H&P (Signed)
General Surgery [facility]  Mack Guise. Truman Hayward DOB: 06-05-1929 Widowed / Language: Cleophus Molt / Race: White Female  History of Present Illness   The patient is a 82 year old female who presents with a parathyroid neoplasm.  Patient is referred by Dr. Einar Pheasant for evaluation of primary hyperparathyroidism. Patient was noted on routine laboratory studies have elevated serum calcium level ranging from 10.7-11.0. Ionized calcium was also elevated at 6.2. Intact PTH level was elevated at 92. Patient underwent nuclear medicine parathyroid scan on May 20, 2016. This demonstrated a left inferior parathyroid adenoma. Patient is now referred for consideration for minimally invasive surgery. Patient does have a distant history of nephrolithiasis. She has no history of osteoporosis. She does note chronic fatigue. She does note frequent urination. She is accompanied today in the office by her daughter. There is no family history of other endocrine neoplasia.   Other Problems Anxiety Disorder Cancer Gastroesophageal Reflux Disease Hemorrhoids Hypercholesterolemia Kidney Stone  Past Surgical History Cataract Surgery Bilateral. Hysterectomy (not due to cancer) - Partial  Diagnostic Studies History Colonoscopy >10 years ago Mammogram within last year Pap Smear >5 years ago  Allergies  No Known Drug Allergies08/08/2016  Medication History  ALPRAZolam (0.25MG  Tablet, Oral) Active. Pantoprazole Sodium (40MG  Tablet DR, Oral) Active. Simvastatin (40MG  Tablet, Oral) Active. Calcium Carbonate (600MG  Tablet, Oral) Active. Cholecalciferol (1000UNIT Capsule, Oral) Active. Cranberry (125MG  Tablet, Oral) Active. Fish Oil (1000MG  Capsule, Oral) Active. Medications Reconciled  Social History Caffeine use Carbonated beverages, Coffee, Tea. No alcohol use No drug use Tobacco use Never smoker.  Family History  Breast Cancer Sister. Cancer  Brother. Colon Polyps Daughter. Thyroid problems Sister.  Pregnancy / Birth History Age at menarche 51 years. Age of menopause 75-55 Gravida 5 Irregular periods Maternal age 41-25 Para 4  Review of Systems General Present- Fatigue. Not Present- Appetite Loss, Chills, Fever, Night Sweats, Weight Gain and Weight Loss. Skin Not Present- Change in Wart/Mole, Dryness, Hives, Jaundice, New Lesions, Non-Healing Wounds, Rash and Ulcer. HEENT Present- Hearing Loss, Visual Disturbances and Wears glasses/contact lenses. Not Present- Earache, Hoarseness, Nose Bleed, Oral Ulcers, Ringing in the Ears, Seasonal Allergies, Sinus Pain, Sore Throat and Yellow Eyes. Respiratory Not Present- Bloody sputum, Chronic Cough, Difficulty Breathing, Snoring and Wheezing. Breast Not Present- Breast Mass, Breast Pain, Nipple Discharge and Skin Changes. Cardiovascular Not Present- Chest Pain, Difficulty Breathing Lying Down, Leg Cramps, Palpitations, Rapid Heart Rate, Shortness of Breath and Swelling of Extremities. Gastrointestinal Present- Constipation and Hemorrhoids. Not Present- Abdominal Pain, Bloating, Bloody Stool, Change in Bowel Habits, Chronic diarrhea, Difficulty Swallowing, Excessive gas, Gets full quickly at meals, Indigestion, Nausea, Rectal Pain and Vomiting. Female Genitourinary Present- Frequency. Not Present- Nocturia, Painful Urination, Pelvic Pain and Urgency. Musculoskeletal Present- Joint Pain. Not Present- Back Pain, Joint Stiffness, Muscle Pain, Muscle Weakness and Swelling of Extremities. Neurological Present- Decreased Memory. Not Present- Fainting, Headaches, Numbness, Seizures, Tingling, Tremor, Trouble walking and Weakness. Psychiatric Present- Anxiety. Not Present- Bipolar, Change in Sleep Pattern, Depression, Fearful and Frequent crying. Endocrine Not Present- Cold Intolerance, Excessive Hunger, Hair Changes, Heat Intolerance, Hot flashes and New Diabetes. Hematology Present- Easy  Bruising. Not Present- Blood Thinners, Excessive bleeding, Gland problems, HIV and Persistent Infections.  Vitals Weight: 138 lb Height: 63in Body Surface Area: 1.65 m Body Mass Index: 24.45 kg/m  BP: 140/80 (Sitting, Left Arm, Standard)   Physical Exam The physical exam findings are as follows: Note:General - appears comfortable, no distress; not diaphorectic  HEENT - normocephalic; sclerae clear, gaze  conjugate; mucous membranes moist, dentition good; voice normal  Neck - symmetric on extension; no palpable anterior or posterior cervical adenopathy; no palpable masses in the thyroid bed  Chest - clear bilaterally without rhonchi, rales, or wheeze  Cor - regular rhythm with normal rate; no significant murmur  Ext - non-tender without significant edema or lymphedema  Neuro - grossly intact; no tremor   Assessment & Plan  PRIMARY HYPERPARATHYROIDISM (E21.0)  Pt Education - Pamphlet Given - The Parathyroid Surgery Book: discussed with patient and provided information.  Patient presents with signs and symptoms of primary hyperparathyroidism. Nuclear medicine parathyroid scan localizes a parathyroid adenoma to the left inferior position. Patient appears to be a good candidate for minimally invasive surgery. Written literature on parathyroid surgery is provided to the patient and her daughter.  I have recommended proceeding with left inferior minimally invasive parathyroidectomy. This can be performed as an outpatient procedure. We discussed risk and benefits of the surgery including the potential for additional abnormal parathyroid glands. We discussed the potential for recurrent laryngeal nerve injury. We discussed the hospital stay and the postoperative recovery. Patient and her daughter understand and wish to proceed with surgery in the near future.  The risks and benefits of the procedure have been discussed at length with the patient. The patient understands the proposed  procedure, potential alternative treatments, and the course of recovery to be expected. All of the patient's questions have been answered at this time. The patient wishes to proceed with surgery.  Earnstine Regal, MD, [facility] Office: [phone]

## 2016-08-15 ENCOUNTER — Encounter (HOSPITAL_COMMUNITY)
Admission: RE | Disposition: A | Discharge status: Home / Self Care | Payer: Self-pay | Source: Ambulatory Visit | Attending: Surgery

## 2016-08-15 ENCOUNTER — Observation Stay (HOSPITAL_COMMUNITY)
Admission: RE | Admit: 2016-08-15 | Discharge: 2016-08-16 | Disposition: A | Discharge status: Home / Self Care | Payer: Medicare Other | Source: Ambulatory Visit | Attending: Surgery | Admitting: Surgery

## 2016-08-15 ENCOUNTER — Ambulatory Visit (HOSPITAL_COMMUNITY): Payer: Medicare Other | Admitting: Certified Registered Nurse Anesthetist

## 2016-08-15 ENCOUNTER — Encounter (HOSPITAL_COMMUNITY): Payer: Self-pay | Admitting: *Deleted

## 2016-08-15 ENCOUNTER — Ambulatory Visit (HOSPITAL_COMMUNITY): Payer: Medicare Other | Admitting: Emergency Medicine

## 2016-08-15 DIAGNOSIS — Z90711 Acquired absence of uterus with remaining cervical stump: Secondary | ICD-10-CM | POA: Diagnosis not present

## 2016-08-15 DIAGNOSIS — M169 Osteoarthritis of hip, unspecified: Secondary | ICD-10-CM | POA: Diagnosis not present

## 2016-08-15 DIAGNOSIS — Z87442 Personal history of urinary calculi: Secondary | ICD-10-CM | POA: Insufficient documentation

## 2016-08-15 DIAGNOSIS — D351 Benign neoplasm of parathyroid gland: Secondary | ICD-10-CM | POA: Insufficient documentation

## 2016-08-15 DIAGNOSIS — Z809 Family history of malignant neoplasm, unspecified: Secondary | ICD-10-CM | POA: Insufficient documentation

## 2016-08-15 DIAGNOSIS — E210 Primary hyperparathyroidism: Principal | ICD-10-CM | POA: Insufficient documentation

## 2016-08-15 DIAGNOSIS — F419 Anxiety disorder, unspecified: Secondary | ICD-10-CM | POA: Insufficient documentation

## 2016-08-15 DIAGNOSIS — K219 Gastro-esophageal reflux disease without esophagitis: Secondary | ICD-10-CM | POA: Diagnosis not present

## 2016-08-15 DIAGNOSIS — Z8639 Personal history of other endocrine, nutritional and metabolic disease: Secondary | ICD-10-CM | POA: Diagnosis present

## 2016-08-15 DIAGNOSIS — E7800 Pure hypercholesterolemia, unspecified: Secondary | ICD-10-CM | POA: Insufficient documentation

## 2016-08-15 DIAGNOSIS — Z803 Family history of malignant neoplasm of breast: Secondary | ICD-10-CM | POA: Diagnosis not present

## 2016-08-15 DIAGNOSIS — D649 Anemia, unspecified: Secondary | ICD-10-CM | POA: Diagnosis not present

## 2016-08-15 HISTORY — PX: PARATHYROIDECTOMY: SHX19

## 2016-08-15 HISTORY — DX: Inflammatory liver disease, unspecified: K75.9

## 2016-08-15 HISTORY — DX: Unspecified osteoarthritis, unspecified site: M19.90

## 2016-08-15 HISTORY — DX: Acute pyelonephritis: N10

## 2016-08-15 HISTORY — DX: Anxiety disorder, unspecified: F41.9

## 2016-08-15 LAB — CALCIUM: Calcium: 9.4 mg/dL (ref 8.9–10.3)

## 2016-08-15 SURGERY — PARATHYROIDECTOMY
Anesthesia: General | Site: Neck | Laterality: Left

## 2016-08-15 MED ORDER — CHLORHEXIDINE GLUCONATE CLOTH 2 % EX PADS: 6.0000 | MEDICATED_PAD | Freq: Once | CUTANEOUS | Status: DC

## 2016-08-15 MED ORDER — FENTANYL CITRATE (PF) 100 MCG/2ML IJ SOLN
INTRAMUSCULAR | Status: DC | PRN
  Administered 2016-08-15 (×2): 50 ug via INTRAVENOUS

## 2016-08-15 MED ORDER — HYDROCODONE-ACETAMINOPHEN 5-325 MG PO TABS: 1.0000 | ORAL_TABLET | ORAL | Status: DC | PRN

## 2016-08-15 MED ORDER — METOCLOPRAMIDE HCL 5 MG/ML IJ SOLN
INTRAMUSCULAR | Status: AC
  Filled 2016-08-15: qty 2

## 2016-08-15 MED ORDER — BUPIVACAINE HCL (PF) 0.25 % IJ SOLN
INTRAMUSCULAR | Status: DC | PRN
  Administered 2016-08-15: 7 mL

## 2016-08-15 MED ORDER — ROCURONIUM BROMIDE 10 MG/ML (PF) SYRINGE
PREFILLED_SYRINGE | INTRAVENOUS | Status: DC | PRN
  Administered 2016-08-15: 40 mg via INTRAVENOUS

## 2016-08-15 MED ORDER — LIDOCAINE 2% (20 MG/ML) 5 ML SYRINGE
INTRAMUSCULAR | Status: DC | PRN
  Administered 2016-08-15: 70 mg via INTRAVENOUS

## 2016-08-15 MED ORDER — HYDROMORPHONE HCL 1 MG/ML IJ SOLN
0.2500 mg | INTRAMUSCULAR | Status: DC | PRN
  Administered 2016-08-15 (×3): 0.25 mg via INTRAVENOUS

## 2016-08-15 MED ORDER — ONDANSETRON 4 MG PO TBDP: 4.0000 mg | ORAL_TABLET | Freq: Four times a day (QID) | ORAL | Status: DC | PRN

## 2016-08-15 MED ORDER — PROMETHAZINE HCL 25 MG/ML IJ SOLN
INTRAMUSCULAR | Status: AC
  Filled 2016-08-15: qty 1

## 2016-08-15 MED ORDER — HYDROMORPHONE HCL 1 MG/ML IJ SOLN: 0.5000 mg | INTRAMUSCULAR | Status: DC | PRN

## 2016-08-15 MED ORDER — PROPOFOL 10 MG/ML IV BOLUS
INTRAVENOUS | Status: AC
  Filled 2016-08-15: qty 20

## 2016-08-15 MED ORDER — LACTATED RINGERS IV SOLN
INTRAVENOUS | Status: DC
  Administered 2016-08-15 (×2): via INTRAVENOUS

## 2016-08-15 MED ORDER — HEMOSTATIC AGENTS (NO CHARGE) OPTIME
TOPICAL | Status: DC | PRN
  Administered 2016-08-15: 1

## 2016-08-15 MED ORDER — HYDROMORPHONE HCL 1 MG/ML IJ SOLN: 0.2500 mg | INTRAMUSCULAR | Status: DC | PRN

## 2016-08-15 MED ORDER — HYDROCODONE-ACETAMINOPHEN 5-325 MG PO TABS
ORAL_TABLET | ORAL | Status: AC
  Filled 2016-08-15: qty 1

## 2016-08-15 MED ORDER — PROMETHAZINE HCL 25 MG/ML IJ SOLN
6.2500 mg | Freq: Once | INTRAMUSCULAR | Status: AC
  Administered 2016-08-15: 6.25 mg via INTRAVENOUS

## 2016-08-15 MED ORDER — HYDROMORPHONE HCL 1 MG/ML IJ SOLN
INTRAMUSCULAR | Status: AC
  Filled 2016-08-15: qty 1

## 2016-08-15 MED ORDER — ACETAMINOPHEN 325 MG PO TABS: 650.0000 mg | ORAL_TABLET | Freq: Four times a day (QID) | ORAL | Status: DC | PRN

## 2016-08-15 MED ORDER — ONDANSETRON HCL 4 MG/2ML IJ SOLN
INTRAMUSCULAR | Status: DC | PRN
  Administered 2016-08-15: 4 mg via INTRAVENOUS

## 2016-08-15 MED ORDER — KCL IN DEXTROSE-NACL 20-5-0.45 MEQ/L-%-% IV SOLN
INTRAVENOUS | Status: DC
  Administered 2016-08-15: 21:00:00 via INTRAVENOUS
  Filled 2016-08-15: qty 1000

## 2016-08-15 MED ORDER — PROPOFOL 10 MG/ML IV BOLUS
INTRAVENOUS | Status: DC | PRN
  Administered 2016-08-15: 130 mg via INTRAVENOUS
  Administered 2016-08-15: 20 mg via INTRAVENOUS

## 2016-08-15 MED ORDER — ONDANSETRON HCL 4 MG/2ML IJ SOLN
4.0000 mg | Freq: Once | INTRAMUSCULAR | Status: AC
  Administered 2016-08-15: 4 mg via INTRAVENOUS

## 2016-08-15 MED ORDER — CEFAZOLIN SODIUM-DEXTROSE 2-4 GM/100ML-% IV SOLN
INTRAVENOUS | Status: AC
  Filled 2016-08-15: qty 100

## 2016-08-15 MED ORDER — 0.9 % SODIUM CHLORIDE (POUR BTL) OPTIME
TOPICAL | Status: DC | PRN
  Administered 2016-08-15: 1000 mL

## 2016-08-15 MED ORDER — SUGAMMADEX SODIUM 200 MG/2ML IV SOLN
INTRAVENOUS | Status: DC | PRN
  Administered 2016-08-15: 200 mg via INTRAVENOUS

## 2016-08-15 MED ORDER — FENTANYL CITRATE (PF) 100 MCG/2ML IJ SOLN
INTRAMUSCULAR | Status: AC
  Filled 2016-08-15: qty 2

## 2016-08-15 MED ORDER — METOCLOPRAMIDE HCL 5 MG/ML IJ SOLN
10.0000 mg | Freq: Once | INTRAMUSCULAR | Status: AC
  Administered 2016-08-15: 10 mg via INTRAVENOUS

## 2016-08-15 MED ORDER — CEFAZOLIN SODIUM-DEXTROSE 2-4 GM/100ML-% IV SOLN
2.0000 g | INTRAVENOUS | Status: AC
  Administered 2016-08-15: 2 g via INTRAVENOUS

## 2016-08-15 MED ORDER — HYDROCODONE-ACETAMINOPHEN 5-325 MG PO TABS: 1.0000 | ORAL_TABLET | ORAL | 0 refills | Status: DC | PRN

## 2016-08-15 MED ORDER — DEXAMETHASONE SODIUM PHOSPHATE 4 MG/ML IJ SOLN
INTRAMUSCULAR | Status: AC
  Filled 2016-08-15: qty 1

## 2016-08-15 MED ORDER — ONDANSETRON HCL 4 MG/2ML IJ SOLN
INTRAMUSCULAR | Status: AC
  Filled 2016-08-15: qty 2

## 2016-08-15 MED ORDER — DEXAMETHASONE SODIUM PHOSPHATE 4 MG/ML IJ SOLN
4.0000 mg | Freq: Once | INTRAMUSCULAR | Status: AC
  Administered 2016-08-15: 4 mg via INTRAVENOUS

## 2016-08-15 MED ORDER — ACETAMINOPHEN 650 MG RE SUPP: 650.0000 mg | Freq: Four times a day (QID) | RECTAL | Status: DC | PRN

## 2016-08-15 MED ORDER — ONDANSETRON HCL 4 MG/2ML IJ SOLN: 4.0000 mg | Freq: Four times a day (QID) | INTRAMUSCULAR | Status: DC | PRN

## 2016-08-15 MED ORDER — BUPIVACAINE HCL (PF) 0.25 % IJ SOLN
INTRAMUSCULAR | Status: AC
  Filled 2016-08-15: qty 30

## 2016-08-15 SURGICAL SUPPLY — 53 items
ATTRACTOMAT 16X20 MAGNETIC DRP (DRAPES) ×3 IMPLANT
BENZOIN TINCTURE PRP APPL 2/3 (GAUZE/BANDAGES/DRESSINGS) ×3 IMPLANT
BLADE SURG 15 STRL LF DISP TIS (BLADE) ×1 IMPLANT
BLADE SURG 15 STRL SS (BLADE) ×2
CANISTER SUCTION 2500CC (MISCELLANEOUS) ×3 IMPLANT
CHLORAPREP W/TINT 26ML (MISCELLANEOUS) ×3 IMPLANT
CLIP TI MEDIUM 6 (CLIP) ×3 IMPLANT
CLIP TI WIDE RED SMALL 6 (CLIP) ×3 IMPLANT
CLOSURE WOUND 1/2 X4 (GAUZE/BANDAGES/DRESSINGS) ×1
CONT SPEC 4OZ CLIKSEAL STRL BL (MISCELLANEOUS) ×6 IMPLANT
COVER SURGICAL LIGHT HANDLE (MISCELLANEOUS) ×3 IMPLANT
CRADLE DONUT ADULT HEAD (MISCELLANEOUS) ×3 IMPLANT
DRAPE LAPAROTOMY T 98X78 PEDS (DRAPES) ×3 IMPLANT
DRAPE UTILITY XL STRL (DRAPES) ×3 IMPLANT
ELECT CAUTERY BLADE 6.4 (BLADE) ×3 IMPLANT
ELECT REM PT RETURN 9FT ADLT (ELECTROSURGICAL) ×3
ELECTRODE REM PT RTRN 9FT ADLT (ELECTROSURGICAL) ×1 IMPLANT
GAUZE SPONGE 2X2 8PLY STRL LF (GAUZE/BANDAGES/DRESSINGS) ×1 IMPLANT
GAUZE SPONGE 4X4 16PLY XRAY LF (GAUZE/BANDAGES/DRESSINGS) ×3 IMPLANT
GLOVE BIO SURGEON STRL SZ 6.5 (GLOVE) ×2 IMPLANT
GLOVE BIO SURGEONS STRL SZ 6.5 (GLOVE) ×1
GLOVE BIOGEL PI IND STRL 7.0 (GLOVE) ×2 IMPLANT
GLOVE BIOGEL PI INDICATOR 7.0 (GLOVE) ×4
GLOVE SURG ORTHO 8.0 STRL STRW (GLOVE) ×3 IMPLANT
GLOVE SURG SS PI 7.0 STRL IVOR (GLOVE) ×3 IMPLANT
GOWN STRL REUS W/ TWL LRG LVL3 (GOWN DISPOSABLE) ×1 IMPLANT
GOWN STRL REUS W/ TWL XL LVL3 (GOWN DISPOSABLE) ×1 IMPLANT
GOWN STRL REUS W/TWL LRG LVL3 (GOWN DISPOSABLE) ×2
GOWN STRL REUS W/TWL XL LVL3 (GOWN DISPOSABLE) ×2
HEMOSTAT SURGICEL 2X4 FIBR (HEMOSTASIS) ×6 IMPLANT
KIT BASIN OR (CUSTOM PROCEDURE TRAY) ×3 IMPLANT
KIT ROOM TURNOVER OR (KITS) ×3 IMPLANT
NEEDLE HYPO 25GX1X1/2 BEV (NEEDLE) ×3 IMPLANT
NS IRRIG 1000ML POUR BTL (IV SOLUTION) ×3 IMPLANT
PACK SURGICAL SETUP 50X90 (CUSTOM PROCEDURE TRAY) ×3 IMPLANT
PAD ARMBOARD 7.5X6 YLW CONV (MISCELLANEOUS) ×3 IMPLANT
PENCIL BUTTON HOLSTER BLD 10FT (ELECTRODE) ×3 IMPLANT
SPONGE GAUZE 2X2 STER 10/PKG (GAUZE/BANDAGES/DRESSINGS) ×2
SPONGE INTESTINAL PEANUT (DISPOSABLE) ×3 IMPLANT
STRIP CLOSURE SKIN 1/2X4 (GAUZE/BANDAGES/DRESSINGS) ×2 IMPLANT
SUT MNCRL AB 4-0 PS2 18 (SUTURE) ×3 IMPLANT
SUT SILK 2 0 (SUTURE)
SUT SILK 2-0 18XBRD TIE 12 (SUTURE) IMPLANT
SUT SILK 3 0 (SUTURE)
SUT SILK 3-0 18XBRD TIE 12 (SUTURE) IMPLANT
SUT VIC AB 3-0 SH 18 (SUTURE) ×3 IMPLANT
SYR BULB 3OZ (MISCELLANEOUS) ×3 IMPLANT
SYR CONTROL 10ML LL (SYRINGE) ×3 IMPLANT
TAPE CLOTH SURG 4X10 WHT LF (GAUZE/BANDAGES/DRESSINGS) ×3 IMPLANT
TOWEL OR 17X24 6PK STRL BLUE (TOWEL DISPOSABLE) ×3 IMPLANT
TOWEL OR 17X26 10 PK STRL BLUE (TOWEL DISPOSABLE) ×3 IMPLANT
TUBE CONNECTING 12'X1/4 (SUCTIONS) ×1
TUBE CONNECTING 12X1/4 (SUCTIONS) ×2 IMPLANT

## 2016-08-15 NOTE — Anesthesia Preprocedure Evaluation (Addendum)
Anesthesia Evaluation  Patient identified by MRN, date of birth, ID band Patient awake    Reviewed: Allergy & Precautions, NPO status , Patient's Chart, lab work & pertinent test results  Airway Mallampati: II  TM Distance: >3 FB     Dental   Pulmonary    breath sounds clear to auscultation       Cardiovascular negative cardio ROS   Rhythm:Regular Rate:Normal     Neuro/Psych    GI/Hepatic GERD  ,  Endo/Other  negative endocrine ROS  Renal/GU Renal disease     Musculoskeletal   Abdominal   Peds  Hematology   Anesthesia Other Findings   Reproductive/Obstetrics                            Anesthesia Physical Anesthesia Plan  ASA: III  Anesthesia Plan: General   Post-op Pain Management:    Induction: Intravenous  Airway Management Planned: Oral ETT  Additional Equipment:   Intra-op Plan:   Post-operative Plan: Extubation in OR  Informed Consent:   Plan Discussed with: CRNA and Anesthesiologist  Anesthesia Plan Comments:         Anesthesia Quick Evaluation

## 2016-08-15 NOTE — Anesthesia Postprocedure Evaluation (Signed)
Anesthesia Post Note  Patient: Sabrina Cervantes  Procedure(s) Performed: Procedure(s) (LRB): LEFT INFERIOR PARATHYROIDECTOMY (Left)  Patient location during evaluation: PACU Anesthesia Type: General Level of consciousness: awake Pain management: pain level controlled Vital Signs Assessment: post-procedure vital signs reviewed and stable Cardiovascular status: stable Anesthetic complications: no    Last Vitals:  Vitals:   08/15/16 1255 08/15/16 1330  BP: (!) 158/72 (!) 175/64  Pulse: 61 66  Resp: 17 17  Temp:  36.4 C    Last Pain:  Vitals:   08/15/16 1255  TempSrc:   PainSc: 3                  EDWARDS,Denese Mentink

## 2016-08-15 NOTE — Progress Notes (Signed)
General Surgery [facility]  Called by PACU with request for admission due to persistent nausea and emesis.  Will admit for IV hydration and Rx of nausea.  Check labs in AM.  Likely home  Tomorrow when tolerating diet.  tmg  Earnstine Regal, MD, [facility] Office: [phone]

## 2016-08-15 NOTE — Anesthesia Postprocedure Evaluation (Signed)
Anesthesia Post Note  Patient: Sabrina Cervantes  Procedure(s) Performed: Procedure(s) (LRB): LEFT INFERIOR PARATHYROIDECTOMY (Left)  Patient location during evaluation: PACU Anesthesia Type: General Level of consciousness: awake and alert and oriented Pain management: pain level controlled Vital Signs Assessment: post-procedure vital signs reviewed and stable Respiratory status: spontaneous breathing, nonlabored ventilation and respiratory function stable Cardiovascular status: stable and blood pressure returned to baseline Postop Assessment: no signs of nausea or vomiting Anesthetic complications: yes Anesthetic complication details: PONVComments: Patient developed nausea prior to discharge.    Last Vitals:  Vitals:   08/15/16 1255 08/15/16 1330  BP: (!) 158/72 (!) 175/64  Pulse: 61 66  Resp: 17 17  Temp:  36.4 C    Last Pain:  Vitals:   08/15/16 1400  TempSrc:   PainSc: 6                  Shi Blankenship A.

## 2016-08-15 NOTE — Addendum Note (Signed)
Addendum  created 08/15/16 1529 by Josephine Igo, MD   Sign clinical note

## 2016-08-15 NOTE — Interval H&P Note (Signed)
History and Physical Interval Note:  08/15/2016 10:10 AM  Sabrina Cervantes  has presented today for surgery, with the diagnosis of Primary hyperparathyroidism.  The various methods of treatment have been discussed with the patient and family. After consideration of risks, benefits and other options for treatment, the patient has consented to    Procedure(s): LEFT INFERIOR PARATHYROIDECTOMY (Left) as a surgical intervention .    The patient's history has been reviewed, patient examined, no change in status, stable for surgery.  I have reviewed the patient's chart and labs.  Questions were answered to the patient's satisfaction.    Earnstine Regal, MD, [facility] Office: [place]

## 2016-08-15 NOTE — Transfer of Care (Signed)
Immediate Anesthesia Transfer of Care Note  Patient: CLEOLA BRANDLEY  Procedure(s) Performed: Procedure(s): LEFT INFERIOR PARATHYROIDECTOMY (Left)  Patient Location: PACU  Anesthesia Type:General  Level of Consciousness: awake, alert , oriented, patient cooperative and responds to stimulation  Airway & Oxygen Therapy: Patient Spontanous Breathing and Patient connected to nasal cannula oxygen  Post-op Assessment: Report given to RN, Post -op Vital signs reviewed and stable, Patient moving all extremities X 4 and Patient able to stick tongue midline  Post vital signs: Reviewed and stable  Last Vitals:  Vitals:   08/15/16 0911  BP: (!) 192/66  Resp: 18  Temp: 36.8 C    Last Pain:  Vitals:   08/15/16 0911  TempSrc: Oral      Patients Stated Pain Goal: 4 ([id number])  Complications: No apparent anesthesia complications

## 2016-08-15 NOTE — Op Note (Signed)
OPERATIVE REPORT - PARATHYROIDECTOMY  Preoperative diagnosis: Primary hyperparathyroidism  Postop diagnosis: Same  Procedure: Left inferior minimally invasive parathyroidectomy  Surgeon:  Earnstine Regal, MD, FACS  Anesthesia: Gen. endotracheal  Estimated blood loss: Minimal  Preparation: ChloraPrep  Indications: The patient is a 82 year old female who presents with a parathyroid neoplasm.  Patient is referred by Dr. Einar Pheasant for evaluation of primary hyperparathyroidism. Patient was noted on routine laboratory studies have elevated serum calcium level ranging from 10.7-11.0. Ionized calcium was also elevated at 6.2. Intact PTH level was elevated at 92. Patient underwent nuclear medicine parathyroid scan on May 20, 2016. This demonstrated a left inferior parathyroid adenoma. Patient is now referred for consideration for minimally invasive surgery.   Procedure: Patient was prepared in the holding area. He was brought to operating room and placed in a supine position on the operating room table. Following administration of general anesthesia, the patient was positioned and then prepped and draped in the usual strict aseptic fashion. After ascertaining that an adequate level of anesthesia been achieved, a neck incision was made with a #15 blade. Dissection was carried through subcutaneous tissues and platysma. Hemostasis was obtained with the electrocautery. Skin flaps were developed circumferentially and a Weitlander retractor was placed for exposure.  Strap muscles were incised in the midline. Strap muscles were reflected exposing the thyroid lobe. With gentle blunt dissection the thyroid lobe was mobilized.  Dissection was carried through adipose tissue and an enlarged parathyroid gland was identified. It was gently mobilized. Vascular structures were divided between small and medium ligaclips. Care was taken to avoid the recurrent laryngeal nerve and the esophagus. The parathyroid  gland was completely excised. It was submitted to pathology where frozen section confirmed parathyroid tissue consistent with adenoma.  Neck was irrigated with warm saline and good hemostasis was noted. Fibrillar was placed in the operative field. Strap muscles were reapproximated in the midline with interrupted 3-0 Vicryl sutures. Platysma was closed with interrupted 3-0 Vicryl sutures. Skin was closed with a running 4-0 Monocryl subcuticular suture. Marcaine was infiltrated circumferentially. Wound was washed and dried and benzoin and Steri-Strips were applied. Sterile gauze dressings were applied. Patient was awakened from anesthesia and brought to the recovery room. The patient tolerated the procedure well.   Earnstine Regal, MD, [facility]

## 2016-08-16 ENCOUNTER — Telehealth: Payer: Self-pay

## 2016-08-16 ENCOUNTER — Encounter (HOSPITAL_COMMUNITY): Payer: Self-pay | Admitting: Surgery

## 2016-08-16 DIAGNOSIS — E7800 Pure hypercholesterolemia, unspecified: Secondary | ICD-10-CM | POA: Diagnosis not present

## 2016-08-16 DIAGNOSIS — K219 Gastro-esophageal reflux disease without esophagitis: Secondary | ICD-10-CM | POA: Diagnosis not present

## 2016-08-16 DIAGNOSIS — Z87442 Personal history of urinary calculi: Secondary | ICD-10-CM | POA: Diagnosis not present

## 2016-08-16 DIAGNOSIS — D351 Benign neoplasm of parathyroid gland: Secondary | ICD-10-CM | POA: Diagnosis not present

## 2016-08-16 DIAGNOSIS — F419 Anxiety disorder, unspecified: Secondary | ICD-10-CM | POA: Diagnosis not present

## 2016-08-16 DIAGNOSIS — E210 Primary hyperparathyroidism: Secondary | ICD-10-CM | POA: Diagnosis not present

## 2016-08-16 LAB — BASIC METABOLIC PANEL
Anion gap: 6 (ref 5–15)
BUN: 7 mg/dL (ref 6–20)
CALCIUM: 9.1 mg/dL (ref 8.9–10.3)
CHLORIDE: 108 mmol/L (ref 101–111)
CO2: 26 mmol/L (ref 22–32)
CREATININE: 0.83 mg/dL (ref 0.44–1.00)
Glucose, Bld: 131 mg/dL — ABNORMAL HIGH (ref 65–99)
Potassium: 3.9 mmol/L (ref 3.5–5.1)
SODIUM: 140 mmol/L (ref 135–145)

## 2016-08-16 MED ORDER — HYDROCODONE-ACETAMINOPHEN 5-325 MG PO TABS: 1.0000 | ORAL_TABLET | ORAL | 0 refills | Status: DC | PRN

## 2016-08-16 NOTE — Telephone Encounter (Signed)
Patient was discharged from the hospital on 10/12, attempted to reach for a hospital follow up call.  Unable to Leave a message, busy signal, will attempt again. thanks

## 2016-08-16 NOTE — Discharge Summary (Signed)
Physician Discharge Summary [facility]  Patient ID: KATRIANA KRAFT MRN: [id number] DOB/AGE: 04/20/1929 82 y.o.  Admit date: 08/15/2016 Discharge date: 08/16/2016  Admission Diagnoses:  Primary hyperparathyroidism  Discharge Diagnoses:  Principal Problem:   Primary hyperparathyroidism [facility])   Discharged Condition: good  Hospital Course: Patient was admitted for observation following parathyroid surgery.  Post op course was complicated by persistent nausea and emesis requiring overnight observation.  Pain was well controlled.  Tolerated diet.  Post op calcium level on morning following surgery was 9.1 mg/dl.  Patient was prepared for discharge home on POD#1.  Consults: None  Treatments: surgery: parathyroidectomy  Discharge Exam: Blood pressure 126/65, pulse 73, temperature 98.2 F (36.8 C), temperature source Oral, resp. rate 18, height 5' 1.5" (1.562 m), weight 67 kg (147 lb 12.8 oz), SpO2 97 %. HEENT - clear Neck - wound dry and intact with mild STS; voice normal Chest - clear bilaterally Cor - RRR  Disposition: Home  Discharge Instructions    Apply dressing    Complete by:  As directed    Apply light gauze dressing to wound before discharge home today.   Diet - low sodium heart healthy    Complete by:  As directed    Diet - low sodium heart healthy    Complete by:  As directed    Discharge instructions    Complete by:  As directed    [facility]  THYROID & PARATHYROID SURGERY:  POST-OP INSTRUCTIONS  Always review your discharge instruction sheet from the facility where your surgery was performed.  A prescription for pain medication may be given to you upon discharge.  Take your pain medication as prescribed.  If narcotic pain medicine is not needed, then you may take acetaminophen (Tylenol) or ibuprofen (Advil) as needed.  Take your usually prescribed medications unless otherwise directed.  If you need a refill on your pain  medication, please contact your pharmacy. They will contact our office to request authorization.  Prescriptions will not be processed by our office after 5 pm or on weekends.  Start with a light diet upon arrival home, such as soup and crackers or toast.  Be sure to drink plenty of fluids daily.  Resume your normal diet the day after surgery.  Most patients will experience some swelling and bruising on the chest and neck area.  Ice packs will help.  Swelling and bruising can take several days to resolve.   It is common to experience some constipation after surgery.  Increasing fluid intake and taking a stool softener will usually help or prevent this problem.  A mild laxative (Milk of Magnesia or Miralax) should be taken according to package directions if there has been no bowel movement after 48 hours.  You have steri-strips and a gauze dressing over your incision.  You may remove the gauze bandage on the second day after surgery, and you may shower at that time.  Leave your steri-strips (small skin tapes) in place directly over the incision.  These strips should remain on the skin for 5-7 days and then be removed.  You may get them wet in the shower and pat them dry.  You may resume regular (light) daily activities beginning the next day - such as daily self-care, walking, climbing stairs - gradually increasing activities as tolerated.  You may have sexual intercourse when it is comfortable.  Refrain from any heavy lifting or straining until approved by your doctor.  You may  drive when you no longer are taking prescription pain medication, you can comfortably wear a seatbelt, and you can safely maneuver your car and apply brakes.  You should see your doctor in the office for a follow-up appointment approximately two to three weeks after your surgery.  Make sure that you call for this appointment within a day or two after you arrive home to insure a convenient appointment time.  WHEN TO CALL YOUR  DOCTOR: -- Fever greater than 101.5 -- Inability to urinate -- Nausea and/or vomiting - persistent -- Extreme swelling or bruising -- Continued bleeding from incision -- Increased pain, redness, or drainage from the incision -- Difficulty swallowing or breathing -- Muscle cramping or spasms -- Numbness or tingling in hands or around lips  The clinic staff is available to answer your questions during regular business hours.  Please don't hesitate to call and ask to speak to one of the nurses if you have concerns.  Earnstine Regal, MD, [facility] Office: [phone]  Website: [email or web address]   Discharge instructions    Complete by:  As directed    [facility]  THYROID & PARATHYROID SURGERY:  POST-OP INSTRUCTIONS  Always review your discharge instruction sheet from the facility where your surgery was performed.  A prescription for pain medication may be given to you upon discharge.  Take your pain medication as prescribed.  If narcotic pain medicine is not needed, then you may take acetaminophen (Tylenol) or ibuprofen (Advil) as needed.  Take your usually prescribed medications unless otherwise directed.  If you need a refill on your pain medication, please contact your pharmacy. They will contact our office to request authorization.  Prescriptions will not be processed by our office after 5 pm or on weekends.  Start with a light diet upon arrival home, such as soup and crackers or toast.  Be sure to drink plenty of fluids daily.  Resume your normal diet the day after surgery.  Most patients will experience some swelling and bruising on the chest and neck area.  Ice packs will help.  Swelling and bruising can take several days to resolve.   It is common to experience some constipation after surgery.  Increasing fluid intake and taking a stool softener will usually help or prevent this problem.  A  mild laxative (Milk of Magnesia or Miralax) should be taken according to package directions if there has been no bowel movement after 48 hours.  You have steri-strips and a gauze dressing over your incision.  You may remove the gauze bandage on the second day after surgery, and you may shower at that time.  Leave your steri-strips (small skin tapes) in place directly over the incision.  These strips should remain on the skin for 5-7 days and then be removed.  You may get them wet in the shower and pat them dry.  You may resume regular (light) daily activities beginning the next day - such as daily self-care, walking, climbing stairs - gradually increasing activities as tolerated.  You may have sexual intercourse when it is comfortable.  Refrain from any heavy lifting or straining until approved by your doctor.  You may drive when you no longer are taking prescription pain medication, you can comfortably wear a seatbelt, and you can safely maneuver your car and apply brakes.  You should see your doctor in the office for a follow-up appointment approximately two to three weeks after your surgery.  Make sure that you call for this appointment within a day or two after you arrive home to insure a convenient appointment time.  WHEN TO CALL YOUR DOCTOR: -- Fever greater than 101.5 -- Inability to urinate -- Nausea and/or vomiting - persistent -- Extreme swelling or bruising -- Continued bleeding from incision -- Increased pain, redness, or drainage from the incision -- Difficulty swallowing or breathing -- Muscle cramping or spasms -- Numbness or tingling in hands or around lips  The clinic staff is available to answer your questions during regular business hours.  Please don't hesitate to call and ask to speak to one of the nurses if you have concerns.  Earnstine Regal, MD, [facility] Office: [phone]  Website:  [email or web address]   Ice pack    Complete by:  As directed    Ice pack    Complete by:  As directed    Increase activity slowly    Complete by:  As directed    Increase activity slowly    Complete by:  As directed    Remove dressing in 24 hours    Complete by:  As directed    Remove dressing in 24 hours    Complete by:  As directed        Medication List    TAKE these medications   ALPRAZolam 0.25 MG tablet Commonly known as:  XANAX TAKE ONE TABLET BY MOUTH ONCE DAILY AS NEEDED   azelastine 0.1 % nasal spray Commonly known as:  ASTELIN Place 1 spray into both nostrils 2 (two) times daily. Use in each nostril as directed   CALCIUM 600+D 600-400 MG-UNIT tablet Generic drug:  Calcium Carbonate-Vitamin D Take 1 tablet by mouth 2 (two) times daily.   CRANBERRY PO Take 1 tablet by mouth daily.   fish oil-omega-3 fatty acids 1000 MG capsule Take 1 g by mouth daily.   HYDROcodone-acetaminophen 5-325 MG tablet Commonly known as:  NORCO/VICODIN Take 1-2 tablets by mouth every 4 (four) hours as needed for moderate pain.   HYDROcodone-acetaminophen 5-325 MG tablet Commonly known as:  NORCO/VICODIN Take 1-2 tablets by mouth every 4 (four) hours as needed for moderate pain.   levocetirizine 5 MG tablet Commonly known as:  XYZAL Take 1 tablet (5 mg total) by mouth every evening.   OVER THE COUNTER MEDICATION Place 1 drop into both eyes daily as needed (for dry eyes).   pantoprazole 40 MG tablet Commonly known as:  PROTONIX Take 1 tablet (40 mg total) by mouth daily.   simvastatin 40 MG tablet Commonly known as:  ZOCOR TAKE ONE TABLET BY MOUTH IN THE EVENING   Vitamin D-3 1000 units Caps Take 1,000 Units by mouth daily.      Follow-up Information    Addalee Kavanagh M, MD. Schedule an appointment as soon as possible for a visit in 3 week(s).   Specialty:  General Surgery Why:  For wound re-check Contact information: [place] [id number] [phone]           Earnstine Regal, MD, [facility] Office: [phone]   Signed: Earnstine Regal 08/16/2016, 8:13 AM

## 2016-08-27 ENCOUNTER — Ambulatory Visit: Payer: Medicare Other | Admitting: Internal Medicine

## 2016-08-30 DIAGNOSIS — E210 Primary hyperparathyroidism: Secondary | ICD-10-CM | POA: Diagnosis not present

## 2016-09-25 ENCOUNTER — Other Ambulatory Visit: Payer: Self-pay | Admitting: Internal Medicine

## 2016-09-25 DIAGNOSIS — Z1231 Encounter for screening mammogram for malignant neoplasm of breast: Secondary | ICD-10-CM

## 2016-10-08 ENCOUNTER — Ambulatory Visit
Admission: RE | Admit: 2016-10-08 | Discharge: 2016-10-08 | Disposition: A | Discharge status: Home / Self Care | Payer: Medicare Other | Source: Ambulatory Visit | Attending: Internal Medicine | Admitting: Internal Medicine

## 2016-10-08 DIAGNOSIS — Z1231 Encounter for screening mammogram for malignant neoplasm of breast: Secondary | ICD-10-CM

## 2016-10-11 ENCOUNTER — Other Ambulatory Visit: Payer: Self-pay

## 2016-11-12 ENCOUNTER — Ambulatory Visit: Payer: Medicare Other

## 2016-11-14 ENCOUNTER — Other Ambulatory Visit: Payer: Self-pay | Admitting: Internal Medicine

## 2017-01-07 DIAGNOSIS — H40003 Preglaucoma, unspecified, bilateral: Secondary | ICD-10-CM | POA: Diagnosis not present

## 2017-01-27 DIAGNOSIS — H40003 Preglaucoma, unspecified, bilateral: Secondary | ICD-10-CM | POA: Diagnosis not present

## 2017-01-27 DIAGNOSIS — H02059 Trichiasis without entropian unspecified eye, unspecified eyelid: Secondary | ICD-10-CM | POA: Diagnosis not present

## 2017-01-28 ENCOUNTER — Ambulatory Visit (INDEPENDENT_AMBULATORY_CARE_PROVIDER_SITE_OTHER): Payer: Medicare Other

## 2017-01-28 VITALS — BP 124/76 | HR 65 | Temp 97.9°F | Resp 12 | Ht 62.5 in | Wt 143.8 lb

## 2017-01-28 DIAGNOSIS — Z0000 Encounter for general adult medical examination without abnormal findings: Secondary | ICD-10-CM | POA: Diagnosis not present

## 2017-01-28 NOTE — Patient Instructions (Addendum)
Sabrina Cervantes , Thank you for taking time to come for your Medicare Wellness Visit. I appreciate your ongoing commitment to your health goals. Please review the following plan we discussed and let me know if I can assist you in the future.   Follow up with Dr. Nicki Reaper as needed.    Bring a copy of your [place] and/or Living Will to be scanned into chart.  Have a great day!   These are the goals we discussed: Goals    . Increase physical activity          Stay active and ride the home exercise bike 3 times a week, 10 minutes.  Increase as tolerated.       This is a list of the screening recommended for you and due dates:  Health Maintenance  Topic Date Due  . Tetanus Vaccine  11/06/1947  . DEXA scan (bone density measurement)  11/07/1995  . Pneumonia vaccines (2 of 2 - PPSV23) 01/19/2015  . Mammogram  10/08/2017  . Flu Shot  Completed   Fall Prevention in the Home Falls can cause injuries. They can happen to people of all ages. There are many things you can do to make your home safe and to help prevent falls. What can I do on the outside of my home?  Regularly fix the edges of walkways and driveways and fix any cracks.  Remove anything that might make you trip as you walk through a door, such as a raised step or threshold.  Trim any bushes or trees on the path to your home.  Use bright outdoor lighting.  Clear any walking paths of anything that might make someone trip, such as rocks or tools.  Regularly check to see if handrails are loose or broken. Make sure that both sides of any steps have handrails.  Any raised decks and porches should have guardrails on the edges.  Have any leaves, snow, or ice cleared regularly.  Use sand or salt on walking paths during winter.  Clean up any spills in your garage right away. This includes oil or grease spills. What can I do in the bathroom?  Use night lights.  Install grab bars by the toilet and in the tub  and shower. Do not use towel bars as grab bars.  Use non-skid mats or decals in the tub or shower.  If you need to sit down in the shower, use a plastic, non-slip stool.  Keep the floor dry. Clean up any water that spills on the floor as soon as it happens.  Remove soap buildup in the tub or shower regularly.  Attach bath mats securely with double-sided non-slip rug tape.  Do not have throw rugs and other things on the floor that can make you trip. What can I do in the bedroom?  Use night lights.  Make sure that you have a light by your bed that is easy to reach.  Do not use any sheets or blankets that are too big for your bed. They should not hang down onto the floor.  Have a firm chair that has side arms. You can use this for support while you get dressed.  Do not have throw rugs and other things on the floor that can make you trip. What can I do in the kitchen?  Clean up any spills right away.  Avoid walking on wet floors.  Keep items that you use a lot in easy-to-reach places.  If you  need to reach something above you, use a strong step stool that has a grab bar.  Keep electrical cords out of the way.  Do not use floor polish or wax that makes floors slippery. If you must use wax, use non-skid floor wax.  Do not have throw rugs and other things on the floor that can make you trip. What can I do with my stairs?  Do not leave any items on the stairs.  Make sure that there are handrails on both sides of the stairs and use them. Fix handrails that are broken or loose. Make sure that handrails are as long as the stairways.  Check any carpeting to make sure that it is firmly attached to the stairs. Fix any carpet that is loose or worn.  Avoid having throw rugs at the top or bottom of the stairs. If you do have throw rugs, attach them to the floor with carpet tape.  Make sure that you have a light switch at the top of the stairs and the bottom of the stairs. If you do  not have them, ask someone to add them for you. What else can I do to help prevent falls?  Wear shoes that:  Do not have high heels.  Have rubber bottoms.  Are comfortable and fit you well.  Are closed at the toe. Do not wear sandals.  If you use a stepladder:  Make sure that it is fully opened. Do not climb a closed stepladder.  Make sure that both sides of the stepladder are locked into place.  Ask someone to hold it for you, if possible.  Clearly mark and make sure that you can see:  Any grab bars or handrails.  First and last steps.  Where the edge of each step is.  Use tools that help you move around (mobility aids) if they are needed. These include:  Canes.  Walkers.  Scooters.  Crutches.  Turn on the lights when you go into a dark area. Replace any light bulbs as soon as they burn out.  Set up your furniture so you have a clear path. Avoid moving your furniture around.  If any of your floors are uneven, fix them.  If there are any pets around you, be aware of where they are.  Review your medicines with your doctor. Some medicines can make you feel dizzy. This can increase your chance of falling. Ask your doctor what other things that you can do to help prevent falls. This information is not intended to replace advice given to you by your health care provider. Make sure you discuss any questions you have with your health care provider. Document Released: 08/17/2009 Document Revised: 03/28/2016 Document Reviewed: 11/25/2014 Elsevier Interactive Patient Education  2017 [employer].

## 2017-01-28 NOTE — Progress Notes (Signed)
Subjective:   Sabrina Cervantes is a 83 y.o. female who presents for Medicare Annual (Subsequent) preventive examination.  Review of Systems:  No ROS.  Medicare Wellness Visit.  Cardiac Risk Factors include: advanced age (>56men, >68 women)     Objective:     Vitals: BP 124/76 (BP Location: Left Arm, Patient Position: Sitting, Cuff Size: Normal)   Pulse 65   Temp 97.9 F (36.6 C) (Oral)   Resp 12   Ht 5' 2.5" (1.588 m)   Wt 143 lb 12.8 oz (65.2 kg)   SpO2 97%   BMI 25.88 kg/m   Body mass index is 25.88 kg/m.   Tobacco History  Smoking Status  . Never Smoker  Smokeless Tobacco  . Never Used     Counseling given: Not Answered   Past Medical History:  Diagnosis Date  . Anemia   . Anxiety   . Arthritis    "left hip" (08/15/2016)  . Basal cell carcinoma    forehead & nose; Cancer Center cut them off; deep cutting  . Depression   . GERD (gastroesophageal reflux disease)   . Hepatitis    "think I had it as a child"  . History of kidney stones   . HOH (hard of hearing)   . Hypercholesterolemia   . Osteoporosis   . Pyelonephritis, acute     s/p ureteral stent  . Vasomotor rhinitis    chronic   Past Surgical History:  Procedure Laterality Date  . ABDOMINAL HYSTERECTOMY  1968   secondary to prolapse, ovaries not removed  . BASAL CELL CARCINOMA EXCISION     forehead & nose; Cancer Center cut them off; deep cutting  . CATARACT EXTRACTION, BILATERAL Bilateral   . COLONOSCOPY    . DILATION AND CURETTAGE OF UTERUS     S/P miscarriage  . LITHOTRIPSY     Dr Yves Dill, nephrolithiasis  . PARATHYROIDECTOMY Left 08/15/2016    inferior minimally invasive Archie Endo 08/15/2016  . PARATHYROIDECTOMY Left 08/15/2016   Procedure: LEFT INFERIOR PARATHYROIDECTOMY;  Surgeon: Armandina Gemma, MD;  Location: [facility];  Service: General;  Laterality: Left;  . RETINAL DETACHMENT SURGERY    . URETERAL STENT PLACEMENT     Family History  Problem Relation Age of Onset  . Skin cancer Father   .  Colon cancer Brother   . Hypertension Sister   . Diabetes Mellitus II Sister   . Breast cancer Sister 71  . Breast cancer      niece   History  Sexual Activity  . Sexual activity: No    Outpatient Encounter Prescriptions as of 01/28/2017  Medication Sig  . ALPRAZolam (XANAX) 0.25 MG tablet TAKE ONE TABLET BY MOUTH ONCE DAILY AS NEEDED  . azelastine (ASTELIN) 0.1 % nasal spray Place 1 spray into both nostrils 2 (two) times daily. Use in each nostril as directed  . Calcium Carbonate-Vitamin D (CALCIUM 600+D) 600-400 MG-UNIT per tablet Take 1 tablet by mouth 2 (two) times daily.  . Cholecalciferol (VITAMIN D-3) 1000 UNITS CAPS Take 1,000 Units by mouth daily.   Marland Kitchen CRANBERRY PO Take 1 tablet by mouth daily.  . fish oil-omega-3 fatty acids 1000 MG capsule Take 1 g by mouth daily.  Marland Kitchen HYDROcodone-acetaminophen (NORCO/VICODIN) 5-325 MG tablet Take 1-2 tablets by mouth every 4 (four) hours as needed for moderate pain.  Marland Kitchen HYDROcodone-acetaminophen (NORCO/VICODIN) 5-325 MG tablet Take 1-2 tablets by mouth every 4 (four) hours as needed for moderate pain.  Marland Kitchen levocetirizine (XYZAL) 5 MG tablet  Take 1 tablet (5 mg total) by mouth every evening.  Marland Kitchen OVER THE COUNTER MEDICATION Place 1 drop into both eyes daily as needed (for dry eyes).  . pantoprazole (PROTONIX) 40 MG tablet Take 1 tablet (40 mg total) by mouth daily.  . pantoprazole (PROTONIX) 40 MG tablet TAKE ONE TABLET BY MOUTH ONCE DAILY  . simvastatin (ZOCOR) 40 MG tablet TAKE ONE TABLET BY MOUTH IN THE EVENING   No facility-administered encounter medications on file as of 01/28/2017.     Activities of Daily Living In your present state of health, do you have any difficulty performing the following activities: 01/28/2017 08/15/2016  Hearing? [place]? N -  Difficulty concentrating or making decisions? Y -  Walking or climbing stairs? Y -  Dressing or bathing? N -  Doing errands, shopping? [place] Don[place]  Preparing Food and eating ? N -  Using the  Toilet? N -  In the past six months, have you accidently leaked urine? Y -  Do you have problems with loss of bowel control? N -  Managing your Medications? N -  Managing your Finances? N -  Housekeeping or managing your Housekeeping? N -  Some recent data might be hidden    Patient Care Team: Einar Pheasant, MD as PCP - General (Internal Medicine)    Assessment:    This is a routine wellness examination for Sabrina Cervantes The goal of the wellness visit is to assist the patient how to close the gaps in care and create a preventative care plan for the patient.   Taking calcium VIT D as appropriate/Osteoporosis reviewed.  Medications reviewed; taking without issues or barriers.  Safety issues reviewed; smoke detectors in the home. No firearms in the home.  Wears seatbelts when driving or riding with others. Patient does wear sunscreen or protective clothing when in direct sunlight. No violence in the home.  Patient is alert, normal appearance, oriented to person/place/and time. Correctly identified the president of the [place], recall of 3/3 words, and performing simple calculations.  Patient displays appropriate judgement and can read correct time from watch face.  No new identified risk were noted.  No failures at ADL's or IADL's.   BMI- discussed the importance of a healthy diet, water intake and exercise. Educational material provided.   HTN- followed by PCP.  Eye- Visual acuity not assessed per patient preference since they have regular follow up with the ophthalmologist.  Wears corrective lenses.  Sleep patterns- Sleeps 5-6 hours at night.  Wakes feeling rested.  TDAP vaccine deferred per patient preference.  Follow up with insurance.  Educational material provided.  Patient Concerns: None at this time. Follow up with PCP as needed.  Exercise Activities and Dietary recommendations Current Exercise Habits: Home exercise routine, Type of exercise: walking, Time (Minutes): 10,  Frequency (Times/Week): 2, Weekly Exercise (Minutes/Week): 20, Intensity: Mild  Goals    . Increase physical activity          Stay active and ride the home exercise bike 3 times a week, 10 minutes.  Increase as tolerated.      Fall Risk Fall Risk  01/28/2017 10/11/2016 08/24/2015 07/26/2014 07/21/2013  Falls in the past year? No No Yes No Yes  Number falls in past yr: - - 1 - 1  Injury with Fall? - - No - Yes   Depression Screen PHQ 2/9 Scores 01/28/2017 08/24/2015 07/26/2014 07/21/2013  PHQ - 2 Score 0 0 0 0     Cognitive Function MMSE -  Mini Mental State Exam 01/28/2017  Orientation to time 5  Orientation to Place 5  Registration 3  Attention/ Calculation 3  Recall 3  Language- name 2 objects 2  Language- repeat 1  Language- follow 3 step command 3  Language- read & follow direction 1  Write a sentence 1  Copy design 1  Total score 28        Immunization History  Administered Date(s) Administered  . Influenza Split 08/08/2012  . Influenza,inj,Quad PF,36+ Mos 07/19/2013, 07/26/2014, 08/24/2015  . Influenza-Unspecified 08/08/2016  . Pneumococcal Conjugate-13 01/18/2014   Screening Tests Health Maintenance  Topic Date Due  . TETANUS/TDAP  11/06/1949  . DEXA SCAN  11/07/1995  . PNA vac Low Risk Adult (2 of 2 - PPSV23) 01/19/2015  . MAMMOGRAM  10/08/2017  . INFLUENZA VACCINE  Completed      Plan:    End of life planning; Advance aging; Advanced directives discussed. Copy of current HCPOA/Living Will requested.    Medicare Attestation I have personally reviewed: The patient's medical and social history Their use of alcohol, tobacco or illicit drugs Their current medications and supplements The patient's functional ability including ADLs,fall risks, home safety risks, cognitive, and hearing and visual impairment Diet and physical activities Evidence for depression   The patient's weight, height, BMI, and visual acuity have been recorded in the chart.  I have  made referrals and provided education to the patient based on review of the above and I have provided the patient with a written personalized care plan for preventive services.    During the course of the visit the patient was educated and counseled about the following appropriate screening and preventive services:   Vaccines to include Pneumoccal, Influenza, Hepatitis B, Td, Zostavax, HCV  Colorectal cancer screening-UTD  Bone density screening-due, osteoporosis-deferred per patient preference, educational material provided  Glaucoma-followed by ophthalmologist  Mammography-UTD  Nutrition counseling   Patient Instructions (the written plan) was given to the patient.   Varney Biles, LPN  [id number]   Reviewed above information.  Agree with plan.  Dr Nicki Reaper

## 2017-02-10 ENCOUNTER — Telehealth: Payer: Self-pay | Admitting: Internal Medicine

## 2017-02-10 MED ORDER — PANTOPRAZOLE SODIUM 40 MG PO TBEC: 40.0000 mg | DELAYED_RELEASE_TABLET | Freq: Every day | ORAL | 0 refills | Status: DC

## 2017-02-10 NOTE — Telephone Encounter (Signed)
Pt called wanting to have her Rx refilled for pantoprazole (PROTONIX) 40 MG tablet.   Pharmacy is [facility], [place] - [place]  Call pt @ [phone]. Thank you!

## 2017-02-24 ENCOUNTER — Encounter: Payer: Self-pay | Admitting: Internal Medicine

## 2017-02-24 ENCOUNTER — Ambulatory Visit (INDEPENDENT_AMBULATORY_CARE_PROVIDER_SITE_OTHER): Payer: Medicare Other | Admitting: Internal Medicine

## 2017-02-24 VITALS — BP 122/74 | HR 75 | Temp 98.6°F | Resp 12 | Ht 63.0 in | Wt 141.0 lb

## 2017-02-24 DIAGNOSIS — K219 Gastro-esophageal reflux disease without esophagitis: Secondary | ICD-10-CM | POA: Diagnosis not present

## 2017-02-24 DIAGNOSIS — R200 Anesthesia of skin: Secondary | ICD-10-CM | POA: Diagnosis not present

## 2017-02-24 DIAGNOSIS — E7800 Pure hypercholesterolemia, unspecified: Secondary | ICD-10-CM

## 2017-02-24 DIAGNOSIS — R202 Paresthesia of skin: Secondary | ICD-10-CM | POA: Diagnosis not present

## 2017-02-24 DIAGNOSIS — Z9109 Other allergy status, other than to drugs and biological substances: Secondary | ICD-10-CM

## 2017-02-24 DIAGNOSIS — Z23 Encounter for immunization: Secondary | ICD-10-CM | POA: Diagnosis not present

## 2017-02-24 DIAGNOSIS — E210 Primary hyperparathyroidism: Secondary | ICD-10-CM

## 2017-02-24 DIAGNOSIS — R5383 Other fatigue: Secondary | ICD-10-CM | POA: Diagnosis not present

## 2017-02-24 NOTE — Progress Notes (Signed)
Patient ID: Sabrina Cervantes, female   DOB: 17-Nov-1928, 83 y.o.   MRN: [id number]   Subjective:    Patient ID: Sabrina Cervantes, female    DOB: 1928-11-24, 83 y.o.   MRN: [id number]  HPI  Patient here for a scheduled follow up.  She reports she is doing relatively well.  She is s/p parathyroid surgery.  Did well with the surgery.  Request to have labs checked today to confirm calcium ok.  She reports some sore throat and increased drainage.  Discussed using her nasal spray.  No fever.  No sinus pressure.  No increased cough or congestion.  Some left hip pain and pain down her left let to knee.  Tries to stay active.  Discussed taking tylenol.  She declines xray or further evaluation.     Past Medical History:  Diagnosis Date  . Anemia   . Anxiety   . Arthritis    "left hip" (08/15/2016)  . Basal cell carcinoma    forehead & nose; Cancer Center cut them off; deep cutting  . Depression   . GERD (gastroesophageal reflux disease)   . Hepatitis    "think I had it as a child"  . History of kidney stones   . HOH (hard of hearing)   . Hypercholesterolemia   . Osteoporosis   . Pyelonephritis, acute     s/p ureteral stent  . Vasomotor rhinitis    chronic   Past Surgical History:  Procedure Laterality Date  . ABDOMINAL HYSTERECTOMY  1968   secondary to prolapse, ovaries not removed  . BASAL CELL CARCINOMA EXCISION     forehead & nose; Cancer Center cut them off; deep cutting  . CATARACT EXTRACTION, BILATERAL Bilateral   . COLONOSCOPY    . DILATION AND CURETTAGE OF UTERUS     S/P miscarriage  . LITHOTRIPSY     Dr Yves Dill, nephrolithiasis  . PARATHYROIDECTOMY Left 08/15/2016    inferior minimally invasive Archie Endo 08/15/2016  . PARATHYROIDECTOMY Left 08/15/2016   Procedure: LEFT INFERIOR PARATHYROIDECTOMY;  Surgeon: Armandina Gemma, MD;  Location: [facility];  Service: General;  Laterality: Left;  . RETINAL DETACHMENT SURGERY    . URETERAL STENT PLACEMENT     Family History  Problem Relation Age of Onset    . Skin cancer Father   . Colon cancer Brother   . Hypertension Sister   . Diabetes Mellitus II Sister   . Breast cancer Sister 71  . Breast cancer      niece   Social History   Social History  . Marital status: Widowed    Spouse name: N/A  . Number of children: 4  . Years of education: N/A   Social History Main Topics  . Smoking status: Never Smoker  . Smokeless tobacco: Never Used  . Alcohol use No  . Drug use: No  . Sexual activity: No   Other Topics Concern  . None   Social History Narrative  . None    Outpatient Encounter Prescriptions as of 02/24/2017  Medication Sig  . ALPRAZolam (XANAX) 0.25 MG tablet TAKE ONE TABLET BY MOUTH ONCE DAILY AS NEEDED  . azelastine (ASTELIN) 0.1 % nasal spray Place 1 spray into both nostrils 2 (two) times daily. Use in each nostril as directed  . Calcium Carbonate-Vitamin D (CALCIUM 600+D) 600-400 MG-UNIT per tablet Take 1 tablet by mouth 2 (two) times daily.  . Cholecalciferol (VITAMIN D-3) 1000 UNITS CAPS Take 1,000 Units by mouth daily.   Marland Kitchen CRANBERRY  PO Take 1 tablet by mouth daily.  . fish oil-omega-3 fatty acids 1000 MG capsule Take 1 g by mouth daily.  Marland Kitchen HYDROcodone-acetaminophen (NORCO/VICODIN) 5-325 MG tablet Take 1-2 tablets by mouth every 4 (four) hours as needed for moderate pain.  Marland Kitchen HYDROcodone-acetaminophen (NORCO/VICODIN) 5-325 MG tablet Take 1-2 tablets by mouth every 4 (four) hours as needed for moderate pain.  Marland Kitchen levocetirizine (XYZAL) 5 MG tablet Take 1 tablet (5 mg total) by mouth every evening.  Marland Kitchen OVER THE COUNTER MEDICATION Place 1 drop into both eyes daily as needed (for dry eyes).  . pantoprazole (PROTONIX) 40 MG tablet Take 1 tablet (40 mg total) by mouth daily.  . simvastatin (ZOCOR) 40 MG tablet TAKE ONE TABLET BY MOUTH IN THE EVENING  . [DISCONTINUED] ALPRAZolam (XANAX) 0.25 MG tablet TAKE ONE TABLET BY MOUTH ONCE DAILY AS NEEDED   No facility-administered encounter medications on file as of 02/24/2017.      Review of Systems  Constitutional: Negative for appetite change and unexpected weight change.  HENT: Positive for congestion and sore throat. Negative for sinus pressure.   Respiratory: Negative for cough, chest tightness and shortness of breath.   Cardiovascular: Negative for chest pain, palpitations and leg swelling.  Gastrointestinal: Negative for abdominal pain, diarrhea, nausea and vomiting.  Genitourinary: Negative for difficulty urinating and dysuria.  Musculoskeletal: Positive for back pain. Negative for myalgias.  Skin: Negative for color change and rash.  Neurological: Negative for dizziness, light-headedness and headaches.  Psychiatric/Behavioral: Negative for agitation and dysphoric mood.       Objective:    Physical Exam  Constitutional: She appears well-developed and well-nourished. No distress.  HENT:  Nose: Nose normal.  Mouth/Throat: Oropharynx is clear and moist.  Neck: Neck supple. No thyromegaly present.  Cardiovascular: Normal rate and regular rhythm.   Pulmonary/Chest: Breath sounds normal. No respiratory distress. She has no wheezes.  Abdominal: Soft. Bowel sounds are normal. There is no tenderness.  Musculoskeletal: She exhibits no edema or tenderness.  No significant pain with SLR.  Able to get up and walk without significant difficulty.    Lymphadenopathy:    She has no cervical adenopathy.  Skin: No rash noted. No erythema.  Psychiatric: She has a normal mood and affect. Her behavior is normal.    BP 122/74 (BP Location: Left Arm, Patient Position: Sitting, Cuff Size: Normal)   Pulse 75   Temp 98.6 F (37 C) (Oral)   Resp 12   Ht 5\' 3"  (1.6 m)   Wt 141 lb (64 kg)   SpO2 99%   BMI 24.98 kg/m  Wt Readings from Last 3 Encounters:  02/24/17 141 lb (64 kg)  01/28/17 143 lb 12.8 oz (65.2 kg)  08/15/16 147 lb 12.8 oz (67 kg)     Lab Results  Component Value Date   WBC 5.8 02/24/2017   HGB 12.4 02/24/2017   HCT 36.5 02/24/2017   PLT  294.0 02/24/2017   GLUCOSE 100 (H) 02/24/2017   CHOL 161 03/28/2016   TRIG 133.0 03/28/2016   HDL 43.70 03/28/2016   LDLDIRECT 109.2 07/13/2013   LDLCALC 90 03/28/2016   ALT 17 02/24/2017   AST 21 02/24/2017   NA 144 02/24/2017   K 3.6 02/24/2017   CL 106 02/24/2017   CREATININE 0.76 02/24/2017   BUN 7 02/24/2017   CO2 32 02/24/2017   TSH 2.88 02/24/2017    Mm Screening Breast Tomo Bilateral  Result Date: 10/09/2016 CLINICAL DATA:  Screening. EXAM: 2D DIGITAL SCREENING  BILATERAL MAMMOGRAM WITH CAD AND ADJUNCT TOMO COMPARISON:  Previous exam(s). ACR Breast Density Category b: There are scattered areas of fibroglandular density. FINDINGS: There are no findings suspicious for malignancy. Images were processed with CAD. IMPRESSION: No mammographic evidence of malignancy. A result letter of this screening mammogram will be mailed directly to the patient. RECOMMENDATION: Screening mammogram in one year. (Code:SM-B-01Y) BI-RADS CATEGORY  1: Negative. Electronically Signed   By: Pamelia Hoit M.D.   On: 10/09/2016 18:04       Assessment & Plan:   Problem List Items Addressed This Visit    Environmental allergies    Saline nasal spray and astelin as directed.  Follow.        GERD (gastroesophageal reflux disease)    Controlled on protonix.        Hypercholesterolemia    On simvastatin.  Low cholesterol diet and exercise.  Follow lipid panel and liver function tests.        Relevant Orders   Hepatic function panel (Completed)   Primary hyperparathyroidism ([place]) - Primary    s/p parathyroidectomy.  Check calcium.  Follow.  Continue f/u with Dr Harlow Asa.       Relevant Orders   CBC with Differential/Platelet (Completed)   Basic metabolic panel (Completed)    Other Visit Diagnoses    Numbness and tingling of left leg       left hip and left leg pain and some numbness and tingling.  check B12.  declined xray.  follow.  tylenol.     Relevant Orders   Vitamin B12 (Completed)   Other  fatigue       Relevant Orders   TSH (Completed)   Need for pneumococcal vaccine           Einar Pheasant, MD

## 2017-02-24 NOTE — Progress Notes (Signed)
Pre-visit discussion using our clinic review tool. No additional management support is needed unless otherwise documented below in the visit note.  

## 2017-02-25 ENCOUNTER — Telehealth: Payer: Self-pay

## 2017-02-25 LAB — CBC WITH DIFFERENTIAL/PLATELET
BASOS PCT: 1.4 % (ref 0.0–3.0)
Basophils Absolute: 0.1 10*3/uL (ref 0.0–0.1)
EOS PCT: 2.6 % (ref 0.0–5.0)
Eosinophils Absolute: 0.2 10*3/uL (ref 0.0–0.7)
HCT: 36.5 % (ref 36.0–46.0)
HEMOGLOBIN: 12.4 g/dL (ref 12.0–15.0)
Lymphocytes Relative: 41.6 % (ref 12.0–46.0)
Lymphs Abs: 2.4 10*3/uL (ref 0.7–4.0)
MCHC: 34 g/dL (ref 30.0–36.0)
MCV: 87.6 fl (ref 78.0–100.0)
MONO ABS: 0.6 10*3/uL (ref 0.1–1.0)
Monocytes Relative: 9.8 % (ref 3.0–12.0)
NEUTROS ABS: 2.6 10*3/uL (ref 1.4–7.7)
Neutrophils Relative %: 44.6 % (ref 43.0–77.0)
PLATELETS: 294 10*3/uL (ref 150.0–400.0)
RBC: 4.17 Mil/uL (ref 3.87–5.11)
RDW: 14.4 % (ref 11.5–15.5)
WBC: 5.8 10*3/uL (ref 4.0–10.5)

## 2017-02-25 LAB — HEPATIC FUNCTION PANEL
ALT: 17 U/L (ref 0–35)
AST: 21 U/L (ref 0–37)
Albumin: 4.4 g/dL (ref 3.5–5.2)
Alkaline Phosphatase: 64 U/L (ref 39–117)
BILIRUBIN TOTAL: 0.6 mg/dL (ref 0.2–1.2)
Bilirubin, Direct: 0.1 mg/dL (ref 0.0–0.3)
Total Protein: 6.1 g/dL (ref 6.0–8.3)

## 2017-02-25 LAB — BASIC METABOLIC PANEL
BUN: 7 mg/dL (ref 6–23)
CALCIUM: 9.7 mg/dL (ref 8.4–10.5)
CO2: 32 meq/L (ref 19–32)
CREATININE: 0.76 mg/dL (ref 0.40–1.20)
Chloride: 106 mEq/L (ref 96–112)
GFR: 76.64 mL/min (ref >60.00)
Glucose, Bld: 100 mg/dL — ABNORMAL HIGH (ref 70–99)
Potassium: 3.6 mEq/L (ref 3.5–5.1)
Sodium: 144 mEq/L (ref 135–145)

## 2017-02-25 LAB — TSH: TSH: 2.88 u[IU]/mL (ref 0.35–4.50)

## 2017-02-25 LAB — VITAMIN B12: Vitamin B-12: 170 pg/mL — ABNORMAL LOW (ref 211–911)

## 2017-02-25 NOTE — Telephone Encounter (Signed)
-----   Message from Einar Pheasant, MD sent at 02/25/2017  1:26 PM EDT ----- Notify pt that her B12 level is low.  She needs to start B12 injections.  Start B12 107mcg q week x 4 weeks and then q month.  hgb, thyroid test, kidney function tests and liver function tests are wnl.

## 2017-02-25 NOTE — Telephone Encounter (Signed)
Spoke with patient she will get with sister and call back for injection appointment. Per lab on 02-24-17 she will need to come onec a week for 4 weeks then once a month.

## 2017-03-03 NOTE — Telephone Encounter (Signed)
Left message to return call to our office.  

## 2017-03-04 ENCOUNTER — Ambulatory Visit (INDEPENDENT_AMBULATORY_CARE_PROVIDER_SITE_OTHER): Payer: Medicare Other | Admitting: *Deleted

## 2017-03-04 DIAGNOSIS — E538 Deficiency of other specified B group vitamins: Secondary | ICD-10-CM

## 2017-03-04 MED ORDER — CYANOCOBALAMIN 1000 MCG/ML IJ SOLN
1000.0000 ug | Freq: Once | INTRAMUSCULAR | Status: AC
  Administered 2017-03-04: 1000 ug via INTRAMUSCULAR

## 2017-03-04 NOTE — Progress Notes (Addendum)
Patient presented for  B 12 injection first of 4  weekly injections given in right deltoid, patient voiced no concerns or any sign of distress during injection.  Reviewed.  Dr Nicki Reaper

## 2017-03-06 NOTE — Telephone Encounter (Signed)
Patient started already

## 2017-03-08 ENCOUNTER — Encounter: Payer: Self-pay | Admitting: Internal Medicine

## 2017-03-08 MED ORDER — ALPRAZOLAM 0.25 MG PO TABS: ORAL_TABLET | ORAL | 0 refills | Status: DC

## 2017-03-08 NOTE — Assessment & Plan Note (Signed)
s/p parathyroidectomy.  Check calcium.  Follow.  Continue f/u with Dr Harlow Asa.

## 2017-03-08 NOTE — Assessment & Plan Note (Signed)
On simvastatin.  Low cholesterol diet and exercise.  Follow lipid panel and liver function tests.   

## 2017-03-08 NOTE — Assessment & Plan Note (Signed)
Controlled on protonix.   

## 2017-03-08 NOTE — Assessment & Plan Note (Signed)
Saline nasal spray and astelin as directed.  Follow.  

## 2017-03-11 ENCOUNTER — Ambulatory Visit (INDEPENDENT_AMBULATORY_CARE_PROVIDER_SITE_OTHER): Payer: Medicare Other | Admitting: *Deleted

## 2017-03-11 DIAGNOSIS — E538 Deficiency of other specified B group vitamins: Secondary | ICD-10-CM | POA: Diagnosis not present

## 2017-03-11 MED ORDER — CYANOCOBALAMIN 1000 MCG/ML IJ SOLN
1000.0000 ug | Freq: Once | INTRAMUSCULAR | Status: AC
  Administered 2017-03-11: 1000 ug via INTRAMUSCULAR

## 2017-03-11 NOTE — Progress Notes (Addendum)
Patient presented for B 12 injection to left deltoid, patient voiced no concerns or showed any sign of distress during injection.  Reviewed.  Dr Nicki Reaper

## 2017-03-18 ENCOUNTER — Ambulatory Visit: Payer: Medicare Other

## 2017-03-19 ENCOUNTER — Ambulatory Visit (INDEPENDENT_AMBULATORY_CARE_PROVIDER_SITE_OTHER): Payer: Medicare Other | Admitting: *Deleted

## 2017-03-19 DIAGNOSIS — E538 Deficiency of other specified B group vitamins: Secondary | ICD-10-CM | POA: Diagnosis not present

## 2017-03-19 MED ORDER — CYANOCOBALAMIN 1000 MCG/ML IJ SOLN
1000.0000 ug | Freq: Once | INTRAMUSCULAR | Status: AC
  Administered 2017-03-19: 1000 ug via INTRAMUSCULAR

## 2017-03-19 NOTE — Progress Notes (Signed)
Patient presented for B 12 injection to right deltoid patient voiced no concern nor showed any sign of distress during injection.

## 2017-03-25 NOTE — Progress Notes (Signed)
  I have reviewed the above information and agree with above.   Teresa Tullo, MD 

## 2017-03-26 ENCOUNTER — Ambulatory Visit (INDEPENDENT_AMBULATORY_CARE_PROVIDER_SITE_OTHER): Payer: Medicare Other

## 2017-03-26 DIAGNOSIS — E538 Deficiency of other specified B group vitamins: Secondary | ICD-10-CM | POA: Diagnosis not present

## 2017-03-26 MED ORDER — CYANOCOBALAMIN 1000 MCG/ML IJ SOLN
1000.0000 ug | Freq: Once | INTRAMUSCULAR | Status: AC
  Administered 2017-03-26: 1000 ug via INTRAMUSCULAR

## 2017-03-26 NOTE — Progress Notes (Signed)
Patient comes in for fourth B 12 injection.  Injected left deltoid.  Patient tolerated injection well.   Patient to follow up  for monthly injection B 12 injections.

## 2017-04-09 ENCOUNTER — Other Ambulatory Visit: Payer: Self-pay | Admitting: Internal Medicine

## 2017-04-09 DIAGNOSIS — E2839 Other primary ovarian failure: Secondary | ICD-10-CM

## 2017-04-09 NOTE — Progress Notes (Signed)
Order placed for bone density  

## 2017-04-24 DIAGNOSIS — M810 Age-related osteoporosis without current pathological fracture: Secondary | ICD-10-CM | POA: Diagnosis not present

## 2017-04-29 ENCOUNTER — Ambulatory Visit (INDEPENDENT_AMBULATORY_CARE_PROVIDER_SITE_OTHER): Payer: Medicare Other | Admitting: *Deleted

## 2017-04-29 DIAGNOSIS — E538 Deficiency of other specified B group vitamins: Secondary | ICD-10-CM | POA: Diagnosis not present

## 2017-04-29 MED ORDER — CYANOCOBALAMIN 1000 MCG/ML IJ SOLN
1000.0000 ug | Freq: Once | INTRAMUSCULAR | Status: AC
  Administered 2017-04-29: 1000 ug via INTRAMUSCULAR

## 2017-04-29 NOTE — Progress Notes (Addendum)
Patient presented for B 12 injection to left deltoid, patient voiced no concerns nor showed any signs of distress during injection.  Reviewed.  Dr Scott 

## 2017-05-27 ENCOUNTER — Telehealth: Payer: Self-pay

## 2017-05-27 NOTE — Telephone Encounter (Signed)
Report received from [facility] for Dexa per Dr. Nicki Reaper it reveals osteoporosis does not want to take prescription meds she will discus more at next o/v on 08/11/17. I am holding for o/v.

## 2017-06-03 ENCOUNTER — Other Ambulatory Visit: Payer: Self-pay

## 2017-06-03 ENCOUNTER — Ambulatory Visit (INDEPENDENT_AMBULATORY_CARE_PROVIDER_SITE_OTHER): Payer: Medicare Other

## 2017-06-03 DIAGNOSIS — E538 Deficiency of other specified B group vitamins: Secondary | ICD-10-CM

## 2017-06-03 MED ORDER — CYANOCOBALAMIN 1000 MCG/ML IJ SOLN
1000.0000 ug | Freq: Once | INTRAMUSCULAR | Status: AC
  Administered 2017-06-03: 1000 ug via INTRAMUSCULAR

## 2017-06-03 MED ORDER — PANTOPRAZOLE SODIUM 40 MG PO TBEC: 40.0000 mg | DELAYED_RELEASE_TABLET | Freq: Every day | ORAL | 0 refills | Status: DC

## 2017-06-03 NOTE — Progress Notes (Addendum)
Patient presented for B 12 injection to right deltoid patient voiced no concern nor showed any sign of distress during injection.  Reviewed.  Dr Nicki Reaper

## 2017-07-08 ENCOUNTER — Ambulatory Visit (INDEPENDENT_AMBULATORY_CARE_PROVIDER_SITE_OTHER): Payer: Medicare Other

## 2017-07-08 DIAGNOSIS — E538 Deficiency of other specified B group vitamins: Secondary | ICD-10-CM | POA: Diagnosis not present

## 2017-07-08 MED ORDER — CYANOCOBALAMIN 1000 MCG/ML IJ SOLN
1000.0000 ug | Freq: Once | INTRAMUSCULAR | Status: AC
  Administered 2017-07-08: 1000 ug via INTRAMUSCULAR

## 2017-07-08 NOTE — Progress Notes (Addendum)
Patient received a b12 injection. Left deltoid, IM. Patient voiced no concerns and showed no signs of distress during injection.   Reviewed.  Dr Nicki Reaper

## 2017-07-28 DIAGNOSIS — H35341 Macular cyst, hole, or pseudohole, right eye: Secondary | ICD-10-CM | POA: Diagnosis not present

## 2017-07-28 DIAGNOSIS — H02053 Trichiasis without entropian right eye, unspecified eyelid: Secondary | ICD-10-CM | POA: Diagnosis not present

## 2017-08-07 ENCOUNTER — Ambulatory Visit (INDEPENDENT_AMBULATORY_CARE_PROVIDER_SITE_OTHER): Payer: Medicare Other | Admitting: *Deleted

## 2017-08-07 DIAGNOSIS — E538 Deficiency of other specified B group vitamins: Secondary | ICD-10-CM | POA: Diagnosis not present

## 2017-08-07 MED ORDER — CYANOCOBALAMIN 1000 MCG/ML IJ SOLN
1000.0000 ug | Freq: Once | INTRAMUSCULAR | Status: AC
  Administered 2017-08-07: 1000 ug via INTRAMUSCULAR

## 2017-08-07 NOTE — Progress Notes (Addendum)
Patient presented for B 12 injection to right deltoid, patient voiced no concerns nor showed any signs of distress during injection.  Reviewed.  Dr Scott 

## 2017-08-11 ENCOUNTER — Ambulatory Visit: Payer: Medicare Other | Admitting: Internal Medicine

## 2017-08-25 ENCOUNTER — Ambulatory Visit (INDEPENDENT_AMBULATORY_CARE_PROVIDER_SITE_OTHER): Payer: Medicare Other | Admitting: Internal Medicine

## 2017-08-25 ENCOUNTER — Encounter: Payer: Self-pay | Admitting: Internal Medicine

## 2017-08-25 DIAGNOSIS — M25552 Pain in left hip: Secondary | ICD-10-CM

## 2017-08-25 DIAGNOSIS — E210 Primary hyperparathyroidism: Secondary | ICD-10-CM | POA: Diagnosis not present

## 2017-08-25 DIAGNOSIS — H9190 Unspecified hearing loss, unspecified ear: Secondary | ICD-10-CM | POA: Diagnosis not present

## 2017-08-25 DIAGNOSIS — E538 Deficiency of other specified B group vitamins: Secondary | ICD-10-CM | POA: Diagnosis not present

## 2017-08-25 DIAGNOSIS — Z23 Encounter for immunization: Secondary | ICD-10-CM

## 2017-08-25 DIAGNOSIS — F439 Reaction to severe stress, unspecified: Secondary | ICD-10-CM | POA: Diagnosis not present

## 2017-08-25 DIAGNOSIS — K219 Gastro-esophageal reflux disease without esophagitis: Secondary | ICD-10-CM

## 2017-08-25 DIAGNOSIS — Z9109 Other allergy status, other than to drugs and biological substances: Secondary | ICD-10-CM | POA: Diagnosis not present

## 2017-08-25 DIAGNOSIS — E559 Vitamin D deficiency, unspecified: Secondary | ICD-10-CM | POA: Diagnosis not present

## 2017-08-25 DIAGNOSIS — M810 Age-related osteoporosis without current pathological fracture: Secondary | ICD-10-CM | POA: Diagnosis not present

## 2017-08-25 DIAGNOSIS — E7800 Pure hypercholesterolemia, unspecified: Secondary | ICD-10-CM | POA: Diagnosis not present

## 2017-08-25 LAB — BASIC METABOLIC PANEL
BUN: 10 mg/dL (ref 6–23)
CHLORIDE: 104 meq/L (ref 96–112)
CO2: 30 mEq/L (ref 19–32)
Calcium: 10.6 mg/dL — ABNORMAL HIGH (ref 8.4–10.5)
Creatinine, Ser: 0.74 mg/dL (ref 0.40–1.20)
GFR: 78.94 mL/min (ref >60.00)
Glucose, Bld: 94 mg/dL (ref 70–99)
POTASSIUM: 4.1 meq/L (ref 3.5–5.1)
Sodium: 144 mEq/L (ref 135–145)

## 2017-08-25 LAB — HEPATIC FUNCTION PANEL
ALBUMIN: 4.3 g/dL (ref 3.5–5.2)
ALK PHOS: 56 U/L (ref 39–117)
ALT: 10 U/L (ref 0–35)
AST: 15 U/L (ref 0–37)
Bilirubin, Direct: 0.1 mg/dL (ref 0.0–0.3)
Total Bilirubin: 0.7 mg/dL (ref 0.2–1.2)
Total Protein: 6.3 g/dL (ref 6.0–8.3)

## 2017-08-25 LAB — LIPID PANEL
Cholesterol: 178 mg/dL (ref 0–200)
HDL: 46.1 mg/dL (ref >39.00)
LDL Cholesterol: 95 mg/dL (ref 0–99)
NonHDL: 132.19
Total CHOL/HDL Ratio: 4
Triglycerides: 187 mg/dL — ABNORMAL HIGH (ref 0.0–149.0)
VLDL: 37.4 mg/dL (ref 0.0–40.0)

## 2017-08-25 LAB — VITAMIN D 25 HYDROXY (VIT D DEFICIENCY, FRACTURES): VITD: 74.33 ng/mL (ref 30.00–100.00)

## 2017-08-25 LAB — VITAMIN B12: VITAMIN B 12: 506 pg/mL (ref 211–911)

## 2017-08-25 MED ORDER — LEVOCETIRIZINE DIHYDROCHLORIDE 5 MG PO TABS: 5.0000 mg | ORAL_TABLET | Freq: Every evening | ORAL | 3 refills | Status: DC

## 2017-08-25 MED ORDER — SIMVASTATIN 40 MG PO TABS: 40.0000 mg | ORAL_TABLET | Freq: Every evening | ORAL | 3 refills | Status: DC

## 2017-08-25 MED ORDER — ALPRAZOLAM 0.25 MG PO TABS: ORAL_TABLET | ORAL | 0 refills | Status: DC

## 2017-08-25 MED ORDER — PANTOPRAZOLE SODIUM 40 MG PO TBEC: 40.0000 mg | DELAYED_RELEASE_TABLET | Freq: Every day | ORAL | 0 refills | Status: DC

## 2017-08-25 NOTE — Progress Notes (Signed)
Patient ID: Sabrina Cervantes, female   DOB: [birth year], 83 y.o.   MRN: [id number]   Subjective:    Patient ID: Sabrina Cervantes, female    DOB: April 06, 1929, 83 y.o.   MRN: [id number]  HPI  Patient here for a scheduled follow up.  Some persistent allergy issues, but no significant sinus symptoms today.  No fever.  No sinus pressure.  No sore throat.  No cough or congestion.  Persistent problems with decreased hearing.  No chest pain.  No sob.  No acid reflux.  No abdominal pain.  Bowels moving.  Left hip and left knee discomfort.  Can't sit for long periods.  Des appear to be better once she is up and moving.  Goes shopping with her sister.  Overall she feels she is doing well.  Some decreased hearing.  Discussed w/up.  She agrees to formal evaluation - referral.     Past Medical History:  Diagnosis Date  . Anemia   . Anxiety   . Arthritis    "left hip" (08/15/2016)  . Basal cell carcinoma    forehead & nose; [facility] cut them off; deep cutting  . Depression   . GERD (gastroesophageal reflux disease)   . Hepatitis    "think I had it as a child"  . History of kidney stones   . HOH (hard of hearing)   . Hypercholesterolemia   . Osteoporosis   . Pyelonephritis, acute     s/p ureteral stent  . Vasomotor rhinitis    chronic   Past Surgical History:  Procedure Laterality Date  . ABDOMINAL HYSTERECTOMY  1968   secondary to prolapse, ovaries not removed  . BASAL CELL CARCINOMA EXCISION     forehead & nose; Cancer Center cut them off; deep cutting  . CATARACT EXTRACTION, BILATERAL Bilateral   . COLONOSCOPY    . DILATION AND CURETTAGE OF UTERUS     S/P miscarriage  . LITHOTRIPSY     Dr Yves Dill, nephrolithiasis  . PARATHYROIDECTOMY Left 08/15/2016    inferior minimally invasive Archie Endo 08/15/2016  . PARATHYROIDECTOMY Left 08/15/2016   Procedure: LEFT INFERIOR PARATHYROIDECTOMY;  Surgeon: Armandina Gemma, MD;  Location: [place];  Service: General;  Laterality: Left;  . RETINAL DETACHMENT SURGERY    .  URETERAL STENT PLACEMENT     Family History  Problem Relation Age of Onset  . Skin cancer Father   . Colon cancer Brother   . Hypertension Sister   . Diabetes Mellitus II Sister   . Breast cancer Sister 69  . Breast cancer Unknown        niece   Social History   Social History  . Marital status: Widowed    Spouse name: N/A  . Number of children: 4  . Years of education: N/A   Social History Main Topics  . Smoking status: Never Smoker  . Smokeless tobacco: Never Used  . Alcohol use No  . Drug use: No  . Sexual activity: No   Other Topics Concern  . None   Social History Narrative  . None    Outpatient Encounter Prescriptions as of 08/25/2017  Medication Sig  . ALPRAZolam (XANAX) 0.25 MG tablet TAKE ONE TABLET BY MOUTH ONCE DAILY AS NEEDED  . azelastine (ASTELIN) 0.1 % nasal spray Place 1 spray into both nostrils 2 (two) times daily. Use in each nostril as directed  . Calcium Carbonate-Vitamin D (CALCIUM 600+D) 600-400 MG-UNIT per tablet Take 1 tablet by mouth 2 (two) times  daily.  . Cholecalciferol (VITAMIN D-3) 1000 UNITS CAPS Take 1,000 Units by mouth daily.   Marland Kitchen CRANBERRY PO Take 1 tablet by mouth daily.  . fish oil-omega-3 fatty acids 1000 MG capsule Take 1 g by mouth daily.  Marland Kitchen HYDROcodone-acetaminophen (NORCO/VICODIN) 5-325 MG tablet Take 1-2 tablets by mouth every 4 (four) hours as needed for moderate pain.  Marland Kitchen HYDROcodone-acetaminophen (NORCO/VICODIN) 5-325 MG tablet Take 1-2 tablets by mouth every 4 (four) hours as needed for moderate pain.  Marland Kitchen levocetirizine (XYZAL) 5 MG tablet Take 1 tablet (5 mg total) by mouth every evening.  Marland Kitchen OVER THE COUNTER MEDICATION Place 1 drop into both eyes daily as needed (for dry eyes).  . pantoprazole (PROTONIX) 40 MG tablet Take 1 tablet (40 mg total) by mouth daily.  . simvastatin (ZOCOR) 40 MG tablet Take 1 tablet (40 mg total) by mouth every evening.  . [DISCONTINUED] ALPRAZolam (XANAX) 0.25 MG tablet TAKE ONE TABLET BY MOUTH  ONCE DAILY AS NEEDED  . [DISCONTINUED] levocetirizine (XYZAL) 5 MG tablet Take 1 tablet (5 mg total) by mouth every evening.  . [DISCONTINUED] pantoprazole (PROTONIX) 40 MG tablet Take 1 tablet (40 mg total) by mouth daily.  . [DISCONTINUED] simvastatin (ZOCOR) 40 MG tablet TAKE ONE TABLET BY MOUTH IN THE EVENING   No facility-administered encounter medications on file as of 08/25/2017.     Review of Systems  Constitutional: Negative for appetite change and unexpected weight change.  HENT: Positive for congestion, hearing loss and postnasal drip. Negative for sinus pressure.   Respiratory: Negative for cough, chest tightness and shortness of breath.   Cardiovascular: Negative for chest pain, palpitations and leg swelling.  Gastrointestinal: Negative for abdominal pain, diarrhea, nausea and vomiting.  Genitourinary: Negative for difficulty urinating and dysuria.  Musculoskeletal: Negative for joint swelling and myalgias.       Left hip and left knee pain as outlined.    Skin: Negative for color change and rash.  Neurological: Negative for dizziness and headaches.  Psychiatric/Behavioral: Negative for agitation and dysphoric mood.       Objective:    Physical Exam  Constitutional: She appears well-developed and well-nourished. No distress.  HENT:  Nose: Nose normal.  Mouth/Throat: Oropharynx is clear and moist.  Neck: Neck supple. No thyromegaly present.  Cardiovascular: Normal rate and regular rhythm.   Pulmonary/Chest: Breath sounds normal. No respiratory distress. She has no wheezes.  Abdominal: Soft. Bowel sounds are normal. There is no tenderness.  Musculoskeletal: She exhibits no edema or tenderness.  Able to walk without significant pain.    Lymphadenopathy:    She has no cervical adenopathy.  Skin: No rash noted. No erythema.  Psychiatric: She has a normal mood and affect. Her behavior is normal.    BP 126/78 (BP Location: Left Arm, Patient Position: Sitting, Cuff  Size: Normal)   Pulse 65   Temp 98.6 F (37 C) (Oral)   Resp 14   Ht 5\' 3"  (1.6 m)   Wt 140 lb 3.2 oz (63.6 kg)   SpO2 98%   BMI 24.84 kg/m  Wt Readings from Last 3 Encounters:  08/25/17 140 lb 3.2 oz (63.6 kg)  02/24/17 141 lb (64 kg)  01/28/17 143 lb 12.8 oz (65.2 kg)     Lab Results  Component Value Date   WBC 5.8 02/24/2017   HGB 12.4 02/24/2017   HCT 36.5 02/24/2017   PLT 294.0 02/24/2017   GLUCOSE 94 08/25/2017   CHOL 178 08/25/2017   TRIG 187.0 (H)  08/25/2017   HDL 46.10 08/25/2017   LDLDIRECT 109.2 07/13/2013   LDLCALC 95 08/25/2017   ALT 10 08/25/2017   AST 15 08/25/2017   NA 144 08/25/2017   K 4.1 08/25/2017   CL 104 08/25/2017   CREATININE 0.74 08/25/2017   BUN 10 08/25/2017   CO2 30 08/25/2017   TSH 2.88 02/24/2017    Mm Screening Breast Tomo Bilateral  Result Date: 10/09/2016 CLINICAL DATA:  Screening. EXAM: 2D DIGITAL SCREENING BILATERAL MAMMOGRAM WITH CAD AND ADJUNCT TOMO COMPARISON:  Previous exam(s). ACR Breast Density Category b: There are scattered areas of fibroglandular density. FINDINGS: There are no findings suspicious for malignancy. Images were processed with CAD. IMPRESSION: No mammographic evidence of malignancy. A result letter of this screening mammogram will be mailed directly to the patient. RECOMMENDATION: Screening mammogram in one year. (Code:SM-B-01Y) BI-RADS CATEGORY  1: Negative. Electronically Signed   By: Pamelia Hoit M.D.   On: 10/09/2016 18:04       Assessment & Plan:   Problem List Items Addressed This Visit    Environmental allergies    Continue current medication regimen.  Reports minimal allergy symptoms now.  Follow.        GERD (gastroesophageal reflux disease)    Controlled on protonix.        Relevant Medications   pantoprazole (PROTONIX) 40 MG tablet   Hypercholesterolemia    On simvastatin.  Low cholesterol diet and exercise.  Follow lipid panel and liver function tests.        Relevant Medications    simvastatin (ZOCOR) 40 MG tablet   Other Relevant Orders   Lipid panel (Completed)   Hepatic function panel (Completed)   Osteoporosis    Declines prescription medication.  Continue dietary calcium.  Weight bearing exercise.        Primary hyperparathyroidism [facility])    S/p parathyroidectomy - Dr Harlow Asa.  Recheck calcium.        Relevant Orders   Basic metabolic panel (Completed)   Stress    Discussed with her today. Overall she feels she is doing relatively well.  Follow.        Vitamin D deficiency    Follow vitamin D level.        Relevant Orders   VITAMIN D 25 Hydroxy (Vit-D Deficiency, Fractures) (Completed)    Other Visit Diagnoses    B12 deficiency       Relevant Orders   Vitamin B12 (Completed)   Encounter for immunization       Relevant Medications   levocetirizine (XYZAL) 5 MG tablet   pantoprazole (PROTONIX) 40 MG tablet   simvastatin (ZOCOR) 40 MG tablet   ALPRAZolam (XANAX) 0.25 MG tablet   Other Relevant Orders   Basic metabolic panel (Completed)   Vitamin B12 (Completed)   VITAMIN D 25 Hydroxy (Vit-D Deficiency, Fractures) (Completed)   Lipid panel (Completed)   Hepatic function panel (Completed)   Flu vaccine HIGH DOSE PF (Completed)   Hearing loss, unspecified hearing loss type, unspecified laterality       discussed with her today.  refer to ENT for formal evaluation.     Relevant Orders   Ambulatory referral to ENT   Left hip pain       left hip and left knee pain.  able to walk without significant difficulty.  desires no further intervention or w/up.         Einar Pheasant, MD

## 2017-08-26 ENCOUNTER — Other Ambulatory Visit: Payer: Self-pay | Admitting: Internal Medicine

## 2017-08-26 NOTE — Progress Notes (Signed)
Order placed for f/u calcium.  

## 2017-08-28 ENCOUNTER — Encounter: Payer: Self-pay | Admitting: Internal Medicine

## 2017-08-28 NOTE — Assessment & Plan Note (Signed)
Follow vitamin D level.  

## 2017-08-28 NOTE — Assessment & Plan Note (Signed)
Continue current medication regimen.  Reports minimal allergy symptoms now.  Follow.

## 2017-08-28 NOTE — Assessment & Plan Note (Signed)
Discussed with her today.  Overall she feels she is doing relatively well.  Follow.   

## 2017-08-28 NOTE — Assessment & Plan Note (Signed)
Declines prescription medication.  Continue dietary calcium.  Weight bearing exercise.

## 2017-08-28 NOTE — Assessment & Plan Note (Signed)
S/p parathyroidectomy - Dr Harlow Asa.  Recheck calcium.

## 2017-08-28 NOTE — Assessment & Plan Note (Signed)
Controlled on protonix.   

## 2017-08-28 NOTE — Assessment & Plan Note (Signed)
On simvastatin.  Low cholesterol diet and exercise.  Follow lipid panel and liver function tests.   

## 2017-09-01 ENCOUNTER — Telehealth: Payer: Self-pay | Admitting: Internal Medicine

## 2017-09-01 DIAGNOSIS — Z1239 Encounter for other screening for malignant neoplasm of breast: Secondary | ICD-10-CM

## 2017-09-01 NOTE — Telephone Encounter (Signed)
Pt called wanting to set up a Mammogram in Dec. Please advise

## 2017-09-02 NOTE — Telephone Encounter (Signed)
App made patient informed on the phone and letter mailed.

## 2017-09-10 ENCOUNTER — Ambulatory Visit (INDEPENDENT_AMBULATORY_CARE_PROVIDER_SITE_OTHER): Payer: Medicare Other

## 2017-09-10 DIAGNOSIS — E538 Deficiency of other specified B group vitamins: Secondary | ICD-10-CM | POA: Diagnosis not present

## 2017-09-10 MED ORDER — CYANOCOBALAMIN 1000 MCG/ML IJ SOLN
1000.0000 ug | Freq: Once | INTRAMUSCULAR | Status: AC
  Administered 2017-09-10: 1000 ug via INTRAMUSCULAR

## 2017-09-10 NOTE — Progress Notes (Signed)
Patient comes in for B 12 injection.  Injected left  deltoid.  Patient tolerated injection well. Patient comes in for B 12 injection.  Injected deltoid.  Patient tolerated injection well.   Reviewed.  Dr Nicki Reaper

## 2017-10-14 ENCOUNTER — Ambulatory Visit: Payer: Medicare Other

## 2017-10-15 ENCOUNTER — Ambulatory Visit (INDEPENDENT_AMBULATORY_CARE_PROVIDER_SITE_OTHER): Payer: Medicare Other | Admitting: *Deleted

## 2017-10-15 DIAGNOSIS — E538 Deficiency of other specified B group vitamins: Secondary | ICD-10-CM

## 2017-10-15 MED ORDER — CYANOCOBALAMIN 1000 MCG/ML IJ SOLN
1000.0000 ug | Freq: Once | INTRAMUSCULAR | Status: AC
  Administered 2017-10-15: 1000 ug via INTRAMUSCULAR

## 2017-10-15 NOTE — Progress Notes (Addendum)
Patient presented for B 12 injection to right deltoid, patient voiced no concerns nor showed any signs of distress during injection.  Reviewed.  Dr Scott 

## 2017-10-16 ENCOUNTER — Ambulatory Visit: Payer: Medicare Other

## 2017-10-29 ENCOUNTER — Ambulatory Visit
Admission: RE | Admit: 2017-10-29 | Discharge: 2017-10-29 | Disposition: A | Discharge status: Home / Self Care | Payer: Medicare Other | Source: Ambulatory Visit | Attending: Internal Medicine | Admitting: Internal Medicine

## 2017-10-29 DIAGNOSIS — Z1231 Encounter for screening mammogram for malignant neoplasm of breast: Secondary | ICD-10-CM | POA: Insufficient documentation

## 2017-10-29 DIAGNOSIS — Z1239 Encounter for other screening for malignant neoplasm of breast: Secondary | ICD-10-CM

## 2017-10-30 ENCOUNTER — Telehealth: Payer: Self-pay | Admitting: *Deleted

## 2017-10-30 NOTE — Telephone Encounter (Signed)
Copied from [place] [id number]. Topic: Inquiry >> Oct 30, 2017 12:02 PM Conception Chancy, NT wrote: Reason for CRM: patient sister Lelon Frohlich is calling to see when the next B12 shot is supposed to be for the patient. Please contact Ann back so she can get this appt made for her sister.

## 2017-11-18 ENCOUNTER — Ambulatory Visit (INDEPENDENT_AMBULATORY_CARE_PROVIDER_SITE_OTHER): Payer: Medicare Other | Admitting: *Deleted

## 2017-11-18 DIAGNOSIS — E538 Deficiency of other specified B group vitamins: Secondary | ICD-10-CM | POA: Diagnosis not present

## 2017-11-18 MED ORDER — CYANOCOBALAMIN 1000 MCG/ML IJ SOLN
1000.0000 ug | Freq: Once | INTRAMUSCULAR | Status: AC
  Administered 2017-11-18: 1000 ug via INTRAMUSCULAR

## 2017-11-18 NOTE — Progress Notes (Addendum)
Patient presented for B 12 injection to left deltoid, patient voiced no concerns nor showed any signs of distress during injection.  Reviewed.  Dr Scott 

## 2017-12-23 ENCOUNTER — Ambulatory Visit: Payer: Medicare Other

## 2017-12-24 ENCOUNTER — Ambulatory Visit: Payer: Medicare Other

## 2017-12-25 ENCOUNTER — Other Ambulatory Visit: Payer: Self-pay | Admitting: Internal Medicine

## 2017-12-25 ENCOUNTER — Ambulatory Visit (INDEPENDENT_AMBULATORY_CARE_PROVIDER_SITE_OTHER): Payer: Medicare Other | Admitting: *Deleted

## 2017-12-25 DIAGNOSIS — E538 Deficiency of other specified B group vitamins: Secondary | ICD-10-CM | POA: Diagnosis not present

## 2017-12-25 MED ORDER — CYANOCOBALAMIN 1000 MCG/ML IJ SOLN
1000.0000 ug | Freq: Once | INTRAMUSCULAR | Status: AC
  Administered 2017-12-25: 1000 ug via INTRAMUSCULAR

## 2017-12-25 NOTE — Progress Notes (Addendum)
Patient presented for B 12 injection to left deltoid, patient voiced no concerns nor showed any signs of distress during injection.  Reviewed.  Dr Scott 

## 2018-01-26 DIAGNOSIS — H40003 Preglaucoma, unspecified, bilateral: Secondary | ICD-10-CM | POA: Diagnosis not present

## 2018-01-27 DIAGNOSIS — H40003 Preglaucoma, unspecified, bilateral: Secondary | ICD-10-CM | POA: Diagnosis not present

## 2018-01-29 ENCOUNTER — Ambulatory Visit: Payer: Medicare Other

## 2018-01-29 ENCOUNTER — Ambulatory Visit (INDEPENDENT_AMBULATORY_CARE_PROVIDER_SITE_OTHER): Payer: Medicare Other

## 2018-01-29 DIAGNOSIS — E538 Deficiency of other specified B group vitamins: Secondary | ICD-10-CM | POA: Diagnosis not present

## 2018-01-29 MED ORDER — CYANOCOBALAMIN 1000 MCG/ML IJ SOLN
1000.0000 ug | Freq: Once | INTRAMUSCULAR | Status: AC
  Administered 2018-01-29: 1000 ug via INTRAMUSCULAR

## 2018-01-29 NOTE — Progress Notes (Addendum)
Patient comes in today for a vitamin B 12 injection. Administered in right deltoid IM. Patient tolerated well.  Reviewed.  Dr Scott 

## 2018-03-03 ENCOUNTER — Ambulatory Visit (INDEPENDENT_AMBULATORY_CARE_PROVIDER_SITE_OTHER): Payer: Medicare Other

## 2018-03-03 VITALS — BP 136/70 | HR 67 | Temp 97.3°F | Resp 14 | Ht 62.5 in | Wt 142.1 lb

## 2018-03-03 DIAGNOSIS — E538 Deficiency of other specified B group vitamins: Secondary | ICD-10-CM

## 2018-03-03 DIAGNOSIS — Z0000 Encounter for general adult medical examination without abnormal findings: Secondary | ICD-10-CM | POA: Diagnosis not present

## 2018-03-03 MED ORDER — CYANOCOBALAMIN 1000 MCG/ML IJ SOLN
1000.0000 ug | Freq: Once | INTRAMUSCULAR | Status: AC
  Administered 2018-03-03: 1000 ug via INTRAMUSCULAR

## 2018-03-03 NOTE — Patient Instructions (Signed)
  Sabrina Cervantes , Thank you for taking time to come for your Medicare Wellness Visit. I appreciate your ongoing commitment to your health goals. Please review the following plan we discussed and let me know if I can assist you in the future.   These are the goals we discussed: Goals    . Increase physical activity     Walk for exercise       This is a list of the screening recommended for you and due dates:  Health Maintenance  Topic Date Due  . Tetanus Vaccine  [birth year]06/1949  . Flu Shot  06/04/2018  . Mammogram  10/29/2018  . DEXA scan (bone density measurement)  Completed  . Pneumonia vaccines  Completed

## 2018-03-03 NOTE — Progress Notes (Addendum)
Subjective:   Sabrina Cervantes is a 84 y.o. female who presents for Medicare Annual (Subsequent) preventive examination.  Review of Systems:  No ROS.  Medicare Wellness Visit. Additional risk factors are reflected in the social history. Cardiac Risk Factors include: advanced age (>25men, >81 women)     Objective:     Vitals: BP 136/70 (BP Location: Right Arm, Patient Position: Sitting, Cuff Size: Normal)   Pulse 67   Temp (!) 97.3 F (36.3 C) (Oral)   Resp 14   Ht 5' 2.5" (1.588 m)   Wt 142 lb 1.9 oz (64.5 kg)   SpO2 96%   BMI 25.58 kg/m   Body mass index is 25.58 kg/m.  Advanced Directives 03/03/2018 01/28/2017 08/15/2016 08/08/2016  Does Patient Have a Medical Advance Directive? Yes Yes Yes Yes  Type of Advance Directive Living will;Healthcare Power of [place];Living will Healthcare Power of [place];Living will  Does patient want to make changes to medical advance directive? No - Patient declined No - Patient declined No - Patient declined No - Patient declined  Copy of [place] in Chart? No - copy requested No - copy requested Yes Yes    Tobacco Social History   Tobacco Use  Smoking Status Never Smoker  Smokeless Tobacco Never Used     Counseling given: Not Answered   Clinical Intake:  Pre-visit preparation completed: Yes  Pain : No/denies pain     Nutritional Status: BMI of 19-24  Normal Diabetes: No  How often do you need to have someone help you when you read instructions, pamphlets, or other written materials from your doctor or pharmacy?: 1 - Never  Interpreter Needed?: No     Past Medical History:  Diagnosis Date  . Anemia   . Anxiety   . Arthritis    "left hip" (08/15/2016)  . Basal cell carcinoma    forehead & nose; Cancer Center cut them off; deep cutting  . Depression   . GERD (gastroesophageal reflux disease)   . Hepatitis    "think I had it as a child"  .  History of kidney stones   . HOH (hard of hearing)   . Hypercholesterolemia   . Osteoporosis   . Pyelonephritis, acute     s/p ureteral stent  . Vasomotor rhinitis    chronic   Past Surgical History:  Procedure Laterality Date  . ABDOMINAL HYSTERECTOMY  1968   secondary to prolapse, ovaries not removed  . BASAL CELL CARCINOMA EXCISION     forehead & nose; Cancer Center cut them off; deep cutting  . CATARACT EXTRACTION, BILATERAL Bilateral   . COLONOSCOPY    . DILATION AND CURETTAGE OF UTERUS     S/P miscarriage  . LITHOTRIPSY     Dr Yves Dill, nephrolithiasis  . PARATHYROIDECTOMY Left 08/15/2016    inferior minimally invasive Archie Endo 08/15/2016  . PARATHYROIDECTOMY Left 08/15/2016   Procedure: LEFT INFERIOR PARATHYROIDECTOMY;  Surgeon: Armandina Gemma, MD;  Location: [place];  Service: General;  Laterality: Left;  . RETINAL DETACHMENT SURGERY    . URETERAL STENT PLACEMENT     Family History  Problem Relation Age of Onset  . Skin cancer Father   . Colon cancer Brother   . Hypertension Sister   . Diabetes Mellitus II Sister   . Breast cancer Sister 58  . Breast cancer Unknown        niece   Social History   Socioeconomic History  .  Marital status: Widowed    Spouse name: Not on file  . Number of children: 4  . Years of education: Not on file  . Highest education level: Not on file  Occupational History  . Not on file  Social Needs  . Financial resource strain: Not hard at all  . Food insecurity:    Worry: Patient refused    Inability: Patient refused  . Transportation needs:    Medical: No    Non-medical: No  Tobacco Use  . Smoking status: Never Smoker  . Smokeless tobacco: Never Used  Substance and Sexual Activity  . Alcohol use: No    Alcohol/week: 0.0 oz  . Drug use: No  . Sexual activity: Never  Lifestyle  . Physical activity:    Days per week: Not on file    Minutes per session: Not on file  . Stress: Not at all  Relationships  . Social connections:     Talks on phone: Not on file    Gets together: Not on file    Attends religious service: Not on file    Active member of club or organization: Not on file    Attends meetings of clubs or organizations: Not on file    Relationship status: Not on file  Other Topics Concern  . Not on file  Social History Narrative  . Not on file    Outpatient Encounter Medications as of 03/03/2018  Medication Sig  . ALPRAZolam (XANAX) 0.25 MG tablet TAKE ONE TABLET BY MOUTH ONCE DAILY AS NEEDED  . azelastine (ASTELIN) 0.1 % nasal spray Place 1 spray into both nostrils 2 (two) times daily. Use in each nostril as directed  . Calcium Carbonate-Vitamin D (CALCIUM 600+D) 600-400 MG-UNIT per tablet Take 1 tablet by mouth 2 (two) times daily.  . Cholecalciferol (VITAMIN D-3) 1000 UNITS CAPS Take 1,000 Units by mouth daily.   Marland Kitchen CRANBERRY PO Take 1 tablet by mouth daily.  . fish oil-omega-3 fatty acids 1000 MG capsule Take 1 g by mouth daily.  Marland Kitchen HYDROcodone-acetaminophen (NORCO/VICODIN) 5-325 MG tablet Take 1-2 tablets by mouth every 4 (four) hours as needed for moderate pain.  Marland Kitchen HYDROcodone-acetaminophen (NORCO/VICODIN) 5-325 MG tablet Take 1-2 tablets by mouth every 4 (four) hours as needed for moderate pain.  Marland Kitchen levocetirizine (XYZAL) 5 MG tablet Take 1 tablet (5 mg total) by mouth every evening.  Marland Kitchen OVER THE COUNTER MEDICATION Place 1 drop into both eyes daily as needed (for dry eyes).  . pantoprazole (PROTONIX) 40 MG tablet TAKE 1 TABLET BY MOUTH ONCE DAILY  . simvastatin (ZOCOR) 40 MG tablet Take 1 tablet (40 mg total) by mouth every evening.  . [EXPIRED] cyanocobalamin ((VITAMIN B-12)) injection 1,000 mcg    No facility-administered encounter medications on file as of 03/03/2018.     Activities of Daily Living In your present state of health, do you have any difficulty performing the following activities: 03/03/2018  Hearing? N  Vision? Y  Difficulty concentrating or making decisions? Y  Comment Notes some  difficulty remebering; memory delay  Walking or climbing stairs? Y  Comment Unsteady gait  Dressing or bathing? N  Doing errands, shopping? Y  Comment She does not drive.   Preparing Food and eating ? N  Using the Toilet? N  In the past six months, have you accidently leaked urine? N  Do you have problems with loss of bowel control? N  Managing your Medications? N  Managing your Finances? N  Housekeeping or managing  your Housekeeping? N  Some recent data might be hidden    Patient Care Team: Einar Pheasant, MD as PCP - General (Internal Medicine)    Assessment:   This is a routine wellness examination for [facility].  The goal of the wellness visit is to assist the patient how to close the gaps in care and create a preventative care plan for the patient.   The roster of all physicians providing medical care to patient is listed in the Snapshot section of the chart.  Taking calcium VIT D as appropriate/Osteoporosis  reviewed.    Safety issues reviewed; Lives alone. Smoke and carbon monoxide detectors in the home. No firearms or firearms locked in a safe within the home. Wears seatbelts when riding with others. No violence in the home.  Life alert information provided.  They do not have excessive sun exposure.  Discussed the need for sun protection: hats, long sleeves and the use of sunscreen if there is significant sun exposure.  Patient is alert, normal appearance, oriented to person/place/and time.  Correctly identified the president of the [place] and recalls of 3/3 words. Performs simple calculations and can read correct time from watch face. Displays appropriate judgement.  No new identified risk were noted.  No failures at ADL's or IADL's.    BMI- discussed the importance of a healthy diet, water intake and the benefits of aerobic exercise. Educational material provided.   24 hour diet recall: Regular diet  Dental- UTD  Eye- Visual acuity not assessed per patient  preference since they have regular follow up with the ophthalmologist.  Wears corrective lenses.  Sleep patterns- Sleeps without issues  B12 administered L deltoid, tolerated well. Educational material provided.  TDAP vaccine deferred per patient preference.  Follow up with insurance.  Educational material provided.  Patient Concerns: None at this time. Follow up with PCP as needed.  Exercise Activities and Dietary recommendations Current Exercise Habits: The patient does not participate in regular exercise at present  Goals    . Increase physical activity     Walk for exercise       Fall Risk Fall Risk  03/03/2018 01/28/2017 10/11/2016 08/24/2015 07/26/2014  Falls in the past year? Yes No No Yes No  Comment - - [place] Telephone Survey: data to providers prior to load - -  Number falls in past yr: 1 - - 1 -  Injury with Fall? No - - No -  Comment Slipped down between her dresser and bed.  No injury.  - - - -  Follow up Education provided;Falls prevention discussed - - - -   Depression Screen PHQ 2/9 Scores 03/03/2018 01/28/2017 08/24/2015 07/26/2014  PHQ - 2 Score 0 0 0 0    Cognitive Function MMSE - Mini Mental State Exam 01/28/2017  Orientation to time 5  Orientation to Place 5  Registration 3  Attention/ Calculation 3  Recall 3  Language- name 2 objects 2  Language- repeat 1  Language- follow 3 step command 3  Language- read & follow direction 1  Write a sentence 1  Copy design 1  Total score 28     6CIT Screen 03/03/2018  What Year? 0 points  What month? 0 points  What time? 0 points  Count back from 20 0 points  Months in reverse 0 points  Repeat phrase 0 points  Total Score 0    Immunization History  Administered Date(s) Administered  . Influenza Split 08/08/2012  . Influenza, High Dose Seasonal PF  08/25/2017  . Influenza,inj,Quad PF,6+ Mos 07/19/2013, 07/26/2014, 08/24/2015  . Influenza-Unspecified 08/08/2016  . Pneumococcal Conjugate-13 01/18/2014  .  Pneumococcal Polysaccharide-23 02/24/2017    Screening Tests Health Maintenance  Topic Date Due  . TETANUS/TDAP  11[birth year]06[birth year]1949  . INFLUENZA VACCINE  06/04/2018  . MAMMOGRAM  10/29/2018  . DEXA SCAN  Completed  . PNA vac Low Risk Adult  Completed      Plan:    End of life planning; Advance aging; Advanced directives discussed. Copy of current HCPOA/Living Will requested.    I have personally reviewed and noted the following in the patient's chart:   . Medical and social history . Use of alcohol, tobacco or illicit drugs  . Current medications and supplements . Functional ability and status . Nutritional status . Physical activity . Advanced directives . List of other physicians . Hospitalizations, surgeries, and ER visits in previous 12 months . Vitals . Screenings to include cognitive, depression, and falls . Referrals and appointments  In addition, I have reviewed and discussed with patient certain preventive protocols, quality metrics, and best practice recommendations. A written personalized care plan for preventive services as well as general preventive health recommendations were provided to patient.     Varney Biles, LPN  04/05/931   Reviewed above information.  Agree with assessment and plan.   Dr Nicki Reaper

## 2018-04-03 ENCOUNTER — Other Ambulatory Visit: Payer: Self-pay | Admitting: Internal Medicine

## 2018-04-07 ENCOUNTER — Telehealth: Payer: Self-pay | Admitting: Internal Medicine

## 2018-04-07 ENCOUNTER — Ambulatory Visit (INDEPENDENT_AMBULATORY_CARE_PROVIDER_SITE_OTHER): Payer: Medicare Other

## 2018-04-07 DIAGNOSIS — E538 Deficiency of other specified B group vitamins: Secondary | ICD-10-CM | POA: Diagnosis not present

## 2018-04-07 MED ORDER — CYANOCOBALAMIN 1000 MCG/ML IJ SOLN
1000.0000 ug | Freq: Once | INTRAMUSCULAR | Status: AC
  Administered 2018-04-07: 1000 ug via INTRAMUSCULAR

## 2018-04-07 NOTE — Telephone Encounter (Signed)
Copied from [place] [id number]. Topic: Quick Communication - See Telephone Encounter >> Apr 07, 2018  4:49 PM Cleaster Corin, NT wrote: CRM for notification. See Telephone encounter for: 04/07/18.  Pt. Sister Stated that she is supposed to have an appt. 04/30/18 for labs pt. Sister Apolonio Schneiders can be reached at  [phone]

## 2018-04-07 NOTE — Progress Notes (Addendum)
Patient comes in today for a vitamin B 12 injection. Administered in left deltoid IM. Patient tolerated well.  Reviewed.  Dr Scott 

## 2018-04-08 ENCOUNTER — Telehealth: Payer: Self-pay | Admitting: *Deleted

## 2018-04-08 NOTE — Telephone Encounter (Signed)
Copied from [place] [id number]. Topic: Appointment Scheduling - Scheduling Inquiry for Clinic >> Apr 08, 2018 12:43 PM Antonieta Iba C wrote: Reason for CRM:   [place] - sister called in to schedule pt a follow up apt with PCP. She said that pt was suppose to have been back to see provider in February. Sister states that there was some confusion with pt and scheduling. She's not sure why pt was scheduled. She would like to know if pt is able to be worked in on PCP schedule?   Please assist further.  [phone]

## 2018-04-09 ENCOUNTER — Other Ambulatory Visit: Payer: Self-pay | Admitting: Internal Medicine

## 2018-04-09 DIAGNOSIS — E210 Primary hyperparathyroidism: Secondary | ICD-10-CM

## 2018-04-09 DIAGNOSIS — E7800 Pure hypercholesterolemia, unspecified: Secondary | ICD-10-CM

## 2018-04-09 NOTE — Telephone Encounter (Signed)
Scheduled patient for 11:30 on 05/04/18 and also lab appt for 6/27 per sisters request. If she does not need labs I will cancel for them.

## 2018-04-09 NOTE — Telephone Encounter (Signed)
I have placed the order for the labs.  She will need to keep both appts.

## 2018-04-09 NOTE — Telephone Encounter (Signed)
See other phone note

## 2018-04-09 NOTE — Telephone Encounter (Signed)
Patients sister [place] is requesting to get appts for patient on the same days as her appts. She has labs on 6/27 and appt with you on 05/04/18. Advised that we may not be able to do the same day for office visit but would discuss with you and see where we could put her on the schedule

## 2018-04-09 NOTE — Progress Notes (Signed)
Orders placed for f/u labs.  

## 2018-04-09 NOTE — Telephone Encounter (Signed)
I can see her at 12:00 on 05/04/18.

## 2018-04-30 ENCOUNTER — Other Ambulatory Visit (INDEPENDENT_AMBULATORY_CARE_PROVIDER_SITE_OTHER): Payer: Medicare Other

## 2018-04-30 DIAGNOSIS — E7800 Pure hypercholesterolemia, unspecified: Secondary | ICD-10-CM | POA: Diagnosis not present

## 2018-04-30 DIAGNOSIS — E210 Primary hyperparathyroidism: Secondary | ICD-10-CM

## 2018-04-30 LAB — HEPATIC FUNCTION PANEL
ALT: 12 U/L (ref 0–35)
AST: 18 U/L (ref 0–37)
Albumin: 4.3 g/dL (ref 3.5–5.2)
Alkaline Phosphatase: 57 U/L (ref 39–117)
BILIRUBIN DIRECT: 0.2 mg/dL (ref 0.0–0.3)
TOTAL PROTEIN: 6.5 g/dL (ref 6.0–8.3)
Total Bilirubin: 0.8 mg/dL (ref 0.2–1.2)

## 2018-04-30 LAB — BASIC METABOLIC PANEL
BUN: 10 mg/dL (ref 6–23)
CHLORIDE: 102 meq/L (ref 96–112)
CO2: 32 mEq/L (ref 19–32)
Calcium: 10.1 mg/dL (ref 8.4–10.5)
Creatinine, Ser: 0.9 mg/dL (ref 0.40–1.20)
GFR: 62.88 mL/min (ref >60.00)
Glucose, Bld: 107 mg/dL — ABNORMAL HIGH (ref 70–99)
POTASSIUM: 4 meq/L (ref 3.5–5.1)
Sodium: 139 mEq/L (ref 135–145)

## 2018-04-30 LAB — CBC WITH DIFFERENTIAL/PLATELET
BASOS PCT: 0.4 % (ref 0.0–3.0)
Basophils Absolute: 0 10*3/uL (ref 0.0–0.1)
Eosinophils Absolute: 0.1 10*3/uL (ref 0.0–0.7)
Eosinophils Relative: 2.3 % (ref 0.0–5.0)
HEMATOCRIT: 35.6 % — AB (ref 36.0–46.0)
HEMOGLOBIN: 12.1 g/dL (ref 12.0–15.0)
LYMPHS PCT: 37.8 % (ref 12.0–46.0)
Lymphs Abs: 2.3 10*3/uL (ref 0.7–4.0)
MCHC: 34 g/dL (ref 30.0–36.0)
MCV: 88.5 fl (ref 78.0–100.0)
Monocytes Absolute: 0.4 10*3/uL (ref 0.1–1.0)
Monocytes Relative: 7.1 % (ref 3.0–12.0)
NEUTROS ABS: 3.2 10*3/uL (ref 1.4–7.7)
Neutrophils Relative %: 52.4 % (ref 43.0–77.0)
Platelets: 272 10*3/uL (ref 150.0–400.0)
RBC: 4.02 Mil/uL (ref 3.87–5.11)
RDW: 14.7 % (ref 11.5–15.5)
WBC: 6.2 10*3/uL (ref 4.0–10.5)

## 2018-04-30 LAB — LIPID PANEL
CHOLESTEROL: 161 mg/dL (ref 0–200)
HDL: 44.4 mg/dL (ref >39.00)
NonHDL: 116.78
Total CHOL/HDL Ratio: 4
Triglycerides: 215 mg/dL — ABNORMAL HIGH (ref 0.0–149.0)
VLDL: 43 mg/dL — AB (ref 0.0–40.0)

## 2018-04-30 LAB — LDL CHOLESTEROL, DIRECT: Direct LDL: 78 mg/dL

## 2018-04-30 LAB — CALCIUM: Calcium: 10.1 mg/dL (ref 8.4–10.5)

## 2018-04-30 LAB — TSH: TSH: 4.84 u[IU]/mL — AB (ref 0.35–4.50)

## 2018-05-04 ENCOUNTER — Encounter: Payer: Self-pay | Admitting: Internal Medicine

## 2018-05-04 ENCOUNTER — Ambulatory Visit (INDEPENDENT_AMBULATORY_CARE_PROVIDER_SITE_OTHER): Payer: Medicare Other

## 2018-05-04 ENCOUNTER — Ambulatory Visit (INDEPENDENT_AMBULATORY_CARE_PROVIDER_SITE_OTHER): Payer: Medicare Other | Admitting: Internal Medicine

## 2018-05-04 VITALS — BP 126/72 | HR 73 | Temp 98.0°F | Resp 18 | Wt 141.4 lb

## 2018-05-04 DIAGNOSIS — K219 Gastro-esophageal reflux disease without esophagitis: Secondary | ICD-10-CM | POA: Diagnosis not present

## 2018-05-04 DIAGNOSIS — M25552 Pain in left hip: Secondary | ICD-10-CM

## 2018-05-04 DIAGNOSIS — E7800 Pure hypercholesterolemia, unspecified: Secondary | ICD-10-CM | POA: Diagnosis not present

## 2018-05-04 DIAGNOSIS — M810 Age-related osteoporosis without current pathological fracture: Secondary | ICD-10-CM

## 2018-05-04 DIAGNOSIS — L989 Disorder of the skin and subcutaneous tissue, unspecified: Secondary | ICD-10-CM | POA: Diagnosis not present

## 2018-05-04 DIAGNOSIS — M79605 Pain in left leg: Secondary | ICD-10-CM

## 2018-05-04 DIAGNOSIS — E210 Primary hyperparathyroidism: Secondary | ICD-10-CM | POA: Diagnosis not present

## 2018-05-04 DIAGNOSIS — Z9109 Other allergy status, other than to drugs and biological substances: Secondary | ICD-10-CM | POA: Diagnosis not present

## 2018-05-04 DIAGNOSIS — R52 Pain, unspecified: Secondary | ICD-10-CM | POA: Diagnosis not present

## 2018-05-04 DIAGNOSIS — E559 Vitamin D deficiency, unspecified: Secondary | ICD-10-CM | POA: Diagnosis not present

## 2018-05-04 DIAGNOSIS — M4807 Spinal stenosis, lumbosacral region: Secondary | ICD-10-CM | POA: Diagnosis not present

## 2018-05-04 DIAGNOSIS — R7989 Other specified abnormal findings of blood chemistry: Secondary | ICD-10-CM

## 2018-05-04 MED ORDER — MUPIROCIN 2 % EX OINT: TOPICAL_OINTMENT | CUTANEOUS | 0 refills | Status: DC

## 2018-05-04 MED ORDER — AZELASTINE HCL 0.1 % NA SOLN: 1.0000 | Freq: Two times a day (BID) | NASAL | 2 refills | Status: DC

## 2018-05-04 NOTE — Progress Notes (Signed)
Patient ID: Sabrina Cervantes, female   DOB: Nov 07, 1928, 84 y.o.   MRN: [id number]   Subjective:    Patient ID: Sabrina Cervantes, female    DOB: November 17, 1928, 84 y.o.   MRN: [id number]  HPI  Patient here for a scheduled follow up.  She reports she is doing relatively well except for her left lateral hip pain and pain down her leg/foot.  Limits her activity.  No chest pain.  No sob.  No acid reflux.  No abdominal pain.  Bowels moving.     Past Medical History:  Diagnosis Date  . Anemia   . Anxiety   . Arthritis    "left hip" (08/15/2016)  . Basal cell carcinoma    forehead & nose; [facility] cut them off; deep cutting  . Depression   . GERD (gastroesophageal reflux disease)   . Hepatitis    "think I had it as a child"  . History of kidney stones   . HOH (hard of hearing)   . Hypercholesterolemia   . Osteoporosis   . Pyelonephritis, acute     s/p ureteral stent  . Vasomotor rhinitis    chronic   Past Surgical History:  Procedure Laterality Date  . ABDOMINAL HYSTERECTOMY  1968   secondary to prolapse, ovaries not removed  . BASAL CELL CARCINOMA EXCISION     forehead & nose; [facility] cut them off; deep cutting  . CATARACT EXTRACTION, BILATERAL Bilateral   . COLONOSCOPY    . DILATION AND CURETTAGE OF UTERUS     S/P miscarriage  . LITHOTRIPSY     Dr Yves Dill, nephrolithiasis  . PARATHYROIDECTOMY Left 08/15/2016    inferior minimally invasive Archie Endo 08/15/2016  . PARATHYROIDECTOMY Left 08/15/2016   Procedure: LEFT INFERIOR PARATHYROIDECTOMY;  Surgeon: Armandina Gemma, MD;  Location: [facility];  Service: General;  Laterality: Left;  . RETINAL DETACHMENT SURGERY    . URETERAL STENT PLACEMENT     Family History  Problem Relation Age of Onset  . Skin cancer Father   . Colon cancer Brother   . Hypertension Sister   . Diabetes Mellitus II Sister   . Breast cancer Sister 65  . Breast cancer Unknown        niece   Social History   Socioeconomic History  . Marital status: Widowed    Spouse  name: Not on file  . Number of children: 4  . Years of education: Not on file  . Highest education level: Not on file  Occupational History  . Not on file  Social Needs  . Financial resource strain: Not hard at all  . Food insecurity:    Worry: Patient refused    Inability: Patient refused  . Transportation needs:    Medical: No    Non-medical: No  Tobacco Use  . Smoking status: Never Smoker  . Smokeless tobacco: Never Used  Substance and Sexual Activity  . Alcohol use: No    Alcohol/week: 0.0 oz  . Drug use: No  . Sexual activity: Never  Lifestyle  . Physical activity:    Days per week: Not on file    Minutes per session: Not on file  . Stress: Not at all  Relationships  . Social connections:    Talks on phone: Not on file    Gets together: Not on file    Attends religious service: Not on file    Active member of club or organization: Not on file    Attends meetings of  clubs or organizations: Not on file    Relationship status: Not on file  Other Topics Concern  . Not on file  Social History Narrative  . Not on file    Outpatient Encounter Medications as of 05/04/2018  Medication Sig  . ALPRAZolam (XANAX) 0.25 MG tablet TAKE ONE TABLET BY MOUTH ONCE DAILY AS NEEDED  . azelastine (ASTELIN) 0.1 % nasal spray Place 1 spray into both nostrils 2 (two) times daily. Use in each nostril as directed  . Calcium Carbonate-Vitamin D (CALCIUM 600+D) 600-400 MG-UNIT per tablet Take 1 tablet by mouth 2 (two) times daily.  . Cholecalciferol (VITAMIN D-3) 1000 UNITS CAPS Take 1,000 Units by mouth daily.   Marland Kitchen CRANBERRY PO Take 1 tablet by mouth daily.  . fish oil-omega-3 fatty acids 1000 MG capsule Take 1 g by mouth daily.  . mupirocin ointment (BACTROBAN) 2 % Apply to affected area bid  . OVER THE COUNTER MEDICATION Place 1 drop into both eyes daily as needed (for dry eyes).  . pantoprazole (PROTONIX) 40 MG tablet TAKE 1 TABLET BY MOUTH ONCE DAILY  . simvastatin (ZOCOR) 40 MG tablet  Take 1 tablet (40 mg total) by mouth every evening.  . [DISCONTINUED] azelastine (ASTELIN) 0.1 % nasal spray Place 1 spray into both nostrils 2 (two) times daily. Use in each nostril as directed  . [DISCONTINUED] HYDROcodone-acetaminophen (NORCO/VICODIN) 5-325 MG tablet Take 1-2 tablets by mouth every 4 (four) hours as needed for moderate pain.  . [DISCONTINUED] HYDROcodone-acetaminophen (NORCO/VICODIN) 5-325 MG tablet Take 1-2 tablets by mouth every 4 (four) hours as needed for moderate pain.  . [DISCONTINUED] levocetirizine (XYZAL) 5 MG tablet Take 1 tablet (5 mg total) by mouth every evening.   No facility-administered encounter medications on file as of 05/04/2018.     Review of Systems  Constitutional: Negative for appetite change and unexpected weight change.  HENT: Negative for congestion and sinus pressure.   Respiratory: Negative for cough, chest tightness and shortness of breath.   Cardiovascular: Negative for chest pain, palpitations and leg swelling.  Gastrointestinal: Negative for abdominal pain, diarrhea and nausea.  Genitourinary: Negative for difficulty urinating and dysuria.  Musculoskeletal: Negative for joint swelling and myalgias.       Increased pain in her left hip and down her left leg to her foot.    Skin: Negative for color change and rash.  Neurological: Negative for dizziness, light-headedness and headaches.  Psychiatric/Behavioral: Negative for agitation and dysphoric mood.       Objective:    Physical Exam  Constitutional: She appears well-developed and well-nourished. No distress.  HENT:  Nose: Nose normal.  Mouth/Throat: Oropharynx is clear and moist.  Neck: Neck supple. No thyromegaly present.  Cardiovascular: Normal rate and regular rhythm.  Pulmonary/Chest: Breath sounds normal. No respiratory distress. She has no wheezes.  Abdominal: Soft. Bowel sounds are normal. There is no tenderness.  Musculoskeletal: She exhibits no edema or tenderness.    Lymphadenopathy:    She has no cervical adenopathy.  Skin: No rash noted. No erythema.  Psychiatric: She has a normal mood and affect. Her behavior is normal.    BP 126/72 (BP Location: Left Arm, Patient Position: Sitting, Cuff Size: Normal)   Pulse 73   Temp 98 F (36.7 C) (Oral)   Resp 18   Wt 141 lb 6.4 oz (64.1 kg)   SpO2 98%   BMI 25.45 kg/m  Wt Readings from Last 3 Encounters:  05/04/18 141 lb 6.4 oz (64.1 kg)  03/03/18  142 lb 1.9 oz (64.5 kg)  08/25/17 140 lb 3.2 oz (63.6 kg)     Lab Results  Component Value Date   WBC 6.2 04/30/2018   HGB 12.1 04/30/2018   HCT 35.6 (L) 04/30/2018   PLT 272.0 04/30/2018   GLUCOSE 107 (H) 04/30/2018   CHOL 161 04/30/2018   TRIG 215.0 (H) 04/30/2018   HDL 44.40 04/30/2018   LDLDIRECT 78.0 04/30/2018   LDLCALC 95 08/25/2017   ALT 12 04/30/2018   AST 18 04/30/2018   NA 139 04/30/2018   K 4.0 04/30/2018   CL 102 04/30/2018   CREATININE 0.90 04/30/2018   BUN 10 04/30/2018   CO2 32 04/30/2018   TSH 4.84 (H) 04/30/2018    Mm Screening Breast Tomo Bilateral  Result Date: 10/29/2017 CLINICAL DATA:  Screening. EXAM: 2D DIGITAL SCREENING BILATERAL MAMMOGRAM WITH CAD AND ADJUNCT TOMO COMPARISON:  Previous exam(s). ACR Breast Density Category b: There are scattered areas of fibroglandular density. FINDINGS: There are no findings suspicious for malignancy. Images were processed with CAD. IMPRESSION: No mammographic evidence of malignancy. A result letter of this screening mammogram will be mailed directly to the patient. RECOMMENDATION: Screening mammogram in one year. (Code:SM-B-01Y) BI-RADS CATEGORY  1: Negative. Electronically Signed   By: Marin Olp M.D.   On: 10/29/2017 16:45       Assessment & Plan:   Problem List Items Addressed This Visit    Environmental allergies    Refill astelin.  Follow.        GERD (gastroesophageal reflux disease)    Controlled on current regimen.  Follow.        Hypercholesterolemia    On  simvastatin.  Low cholesterol diet and exercise.  Follow lipid panel and liver function tests.        Relevant Orders   TSH   Lipid panel   Hepatic function panel   Basic metabolic panel   Left hip pain    Left hip and left leg pain as outlined.  Tylenol as directed.  Check xray.        Relevant Orders   DG Lumbar Spine 2-3 Views (Completed)   DG HIP UNILAT WITH PELVIS 2-3 VIEWS LEFT (Completed)   Osteoporosis    Declines prescription medication.  Continue dietary calcium.       Primary hyperparathyroidism ([place])    S/p parathyroidectomy.  Has been followed by Dr Harlow Asa.  Follow calcium.        Vitamin D deficiency    Follow vitamin D level.        Relevant Orders   VITAMIN D 25 Hydroxy (Vit-D Deficiency, Fractures)    Other Visit Diagnoses    Left leg pain    -  Primary   Relevant Orders   DG Lumbar Spine 2-3 Views (Completed)   Arm lesion       Left arm lesion.  Bactroban. Notify me if does not resolve.     Elevated TSH           Einar Pheasant, MD

## 2018-05-06 ENCOUNTER — Telehealth: Payer: Self-pay | Admitting: Internal Medicine

## 2018-05-06 DIAGNOSIS — M79605 Pain in left leg: Secondary | ICD-10-CM

## 2018-05-06 DIAGNOSIS — M25552 Pain in left hip: Secondary | ICD-10-CM

## 2018-05-06 NOTE — Telephone Encounter (Signed)
Copied from [place] [phone]. Topic: Quick Communication - See Telephone Encounter >> May 06, 2018 10:13 AM Percell Belt A wrote: CRM for notification. See Telephone encounter for: 05/06/18.  Pt sister called in and stated that pt was told that she need to have PT on her leg and would someone would call to setup?  They are request if pt can have Physical Therapy at home on her leg because pt does not drive?    Please Advise  Best [phone]

## 2018-05-06 NOTE — Telephone Encounter (Signed)
Can we place order for home health PT?

## 2018-05-07 NOTE — Telephone Encounter (Signed)
Order placed for home health

## 2018-05-10 ENCOUNTER — Encounter: Payer: Self-pay | Admitting: Internal Medicine

## 2018-05-10 NOTE — Assessment & Plan Note (Signed)
S/p parathyroidectomy.  Has been followed by Dr Gerkin.  Follow calcium.   

## 2018-05-10 NOTE — Assessment & Plan Note (Signed)
Controlled on current regimen.  Follow.  

## 2018-05-10 NOTE — Assessment & Plan Note (Signed)
Follow vitamin D level.  

## 2018-05-10 NOTE — Assessment & Plan Note (Signed)
Left hip and left leg pain as outlined.  Tylenol as directed.  Check xray.

## 2018-05-10 NOTE — Assessment & Plan Note (Signed)
On simvastatin.  Low cholesterol diet and exercise.  Follow lipid panel and liver function tests.   

## 2018-05-10 NOTE — Assessment & Plan Note (Signed)
Refill astelin.  Follow.

## 2018-05-10 NOTE — Assessment & Plan Note (Signed)
Declines prescription medication.  Continue dietary calcium.

## 2018-05-12 ENCOUNTER — Ambulatory Visit (INDEPENDENT_AMBULATORY_CARE_PROVIDER_SITE_OTHER): Payer: Medicare Other

## 2018-05-12 DIAGNOSIS — E538 Deficiency of other specified B group vitamins: Secondary | ICD-10-CM

## 2018-05-12 MED ORDER — CYANOCOBALAMIN 1000 MCG/ML IJ SOLN
1000.0000 ug | Freq: Once | INTRAMUSCULAR | Status: AC
  Administered 2018-05-12: 1000 ug via INTRAMUSCULAR

## 2018-05-12 NOTE — Progress Notes (Addendum)
Patient comes in today for a vitamin B 12 injection. Administered in right deltoid IM. Patient tolerated well.  Reviewed.  Dr Scott 

## 2018-05-14 ENCOUNTER — Telehealth: Payer: Self-pay | Admitting: Internal Medicine

## 2018-05-14 ENCOUNTER — Other Ambulatory Visit: Payer: Self-pay

## 2018-05-14 DIAGNOSIS — M1612 Unilateral primary osteoarthritis, left hip: Secondary | ICD-10-CM | POA: Diagnosis not present

## 2018-05-14 DIAGNOSIS — M810 Age-related osteoporosis without current pathological fracture: Secondary | ICD-10-CM | POA: Diagnosis not present

## 2018-05-14 MED ORDER — ALPRAZOLAM 0.25 MG PO TABS: ORAL_TABLET | ORAL | 0 refills | Status: DC

## 2018-05-14 NOTE — Telephone Encounter (Signed)
Verbal orders for PT given to [facility] at [place], please advise on medications listed in message.

## 2018-05-14 NOTE — Telephone Encounter (Signed)
Please advise 

## 2018-05-14 NOTE — Telephone Encounter (Signed)
Ok to refill xanax x 1.  Did physical therapy recommend walker.  I do not mind getting her rx for this, just wanted to know if they recommended this.

## 2018-05-14 NOTE — Telephone Encounter (Signed)
Copied from [place] [id number]. Topic: Quick Communication - Rx Refill/Question >> May 14, 2018  2:59 PM Boyd Kerbs wrote: Medication:  ALPRAZolam Duanne Moron) 0.25 MG tablet (goes to [place])  Needing 2 wheel rolling walking - prescription for this needs to be faxed to [facility] [phone]  Has the patient contacted their pharmacy? No. (Agent: If no, request that the patient contact the pharmacy for the refill.) (Agent: If yes, when and what did the pharmacy advise?)  Preferred Pharmacy (with phone number or street name):   [facility] [address] [place] - [place] [address] Phone: [phone] Fax: [phone]    Agent: Please be advised that RX refills may take up to 3 business days. We ask that you follow-up with your pharmacy.

## 2018-05-14 NOTE — Telephone Encounter (Unsigned)
Copied from [place] [phone]. Topic: Quick Communication - See Telephone Encounter >> May 14, 2018  3:21 PM Boyd Kerbs wrote: CRM for notification. See Telephone encounter for: 05/14/18.  Needing 2 wheel rolling walking - prescription for this needs to be faxed to [facility] [phone]

## 2018-05-14 NOTE — Telephone Encounter (Signed)
I have given verbal orders. Now just requesting rx for walker.

## 2018-05-14 NOTE — Telephone Encounter (Signed)
Xanax 0.25 mg refill Last OV:08/25/17 Last refill:08/25/17 30 tab/0 refill TVM:[id number] Pharmacy: [facility] [address] [place] [phone] (Phone) [phone] (Fax)

## 2018-05-14 NOTE — Telephone Encounter (Signed)
See other notes

## 2018-05-14 NOTE — Telephone Encounter (Signed)
Copied from [place] [id number]. Topic: Quick Communication - See Telephone Encounter >> May 14, 2018  2:56 PM Boyd Kerbs wrote: CRM for notification. See Telephone encounter for: 05/14/18.  [place] [phone] Verbal order for PT 1 x 1;  2 x 3; 1 x 1  Dulcolax 5mg  - 1 tablet as needed;  and Acetaminophen  500mg  as needed for pain - she gets these over counter

## 2018-05-14 NOTE — Telephone Encounter (Signed)
Ok to give verbal orders?

## 2018-05-14 NOTE — Telephone Encounter (Signed)
rx for xanax signed and placed in box. rx for walker written and placed in box.

## 2018-05-14 NOTE — Telephone Encounter (Signed)
See attached - did therapy recommend.

## 2018-05-14 NOTE — Telephone Encounter (Signed)
I gave verbal orders to [facility]. They are recommending walker because patient will have sudden pain then become unsteady on her feet.

## 2018-05-15 NOTE — Telephone Encounter (Signed)
I have faxed to patient's requested pharmacy.

## 2018-05-20 DIAGNOSIS — M810 Age-related osteoporosis without current pathological fracture: Secondary | ICD-10-CM | POA: Diagnosis not present

## 2018-05-20 DIAGNOSIS — M1612 Unilateral primary osteoarthritis, left hip: Secondary | ICD-10-CM | POA: Diagnosis not present

## 2018-05-21 DIAGNOSIS — M1612 Unilateral primary osteoarthritis, left hip: Secondary | ICD-10-CM | POA: Diagnosis not present

## 2018-05-21 DIAGNOSIS — M810 Age-related osteoporosis without current pathological fracture: Secondary | ICD-10-CM | POA: Diagnosis not present

## 2018-05-26 DIAGNOSIS — M1612 Unilateral primary osteoarthritis, left hip: Secondary | ICD-10-CM | POA: Diagnosis not present

## 2018-05-26 DIAGNOSIS — M810 Age-related osteoporosis without current pathological fracture: Secondary | ICD-10-CM | POA: Diagnosis not present

## 2018-05-28 DIAGNOSIS — M1612 Unilateral primary osteoarthritis, left hip: Secondary | ICD-10-CM | POA: Diagnosis not present

## 2018-05-28 DIAGNOSIS — M810 Age-related osteoporosis without current pathological fracture: Secondary | ICD-10-CM | POA: Diagnosis not present

## 2018-06-01 DIAGNOSIS — M1612 Unilateral primary osteoarthritis, left hip: Secondary | ICD-10-CM | POA: Diagnosis not present

## 2018-06-01 DIAGNOSIS — M810 Age-related osteoporosis without current pathological fracture: Secondary | ICD-10-CM | POA: Diagnosis not present

## 2018-06-03 DIAGNOSIS — M810 Age-related osteoporosis without current pathological fracture: Secondary | ICD-10-CM | POA: Diagnosis not present

## 2018-06-03 DIAGNOSIS — H9190 Unspecified hearing loss, unspecified ear: Secondary | ICD-10-CM

## 2018-06-03 DIAGNOSIS — Z960 Presence of urogenital implants: Secondary | ICD-10-CM

## 2018-06-03 DIAGNOSIS — L988 Other specified disorders of the skin and subcutaneous tissue: Secondary | ICD-10-CM | POA: Diagnosis not present

## 2018-06-03 DIAGNOSIS — M1612 Unilateral primary osteoarthritis, left hip: Secondary | ICD-10-CM | POA: Diagnosis not present

## 2018-06-03 DIAGNOSIS — F329 Major depressive disorder, single episode, unspecified: Secondary | ICD-10-CM

## 2018-06-03 DIAGNOSIS — Z9181 History of falling: Secondary | ICD-10-CM

## 2018-06-03 DIAGNOSIS — F419 Anxiety disorder, unspecified: Secondary | ICD-10-CM | POA: Diagnosis not present

## 2018-06-03 DIAGNOSIS — R946 Abnormal results of thyroid function studies: Secondary | ICD-10-CM

## 2018-06-03 DIAGNOSIS — D649 Anemia, unspecified: Secondary | ICD-10-CM | POA: Diagnosis not present

## 2018-06-03 DIAGNOSIS — E210 Primary hyperparathyroidism: Secondary | ICD-10-CM | POA: Diagnosis not present

## 2018-06-03 DIAGNOSIS — Z85828 Personal history of other malignant neoplasm of skin: Secondary | ICD-10-CM

## 2018-06-03 DIAGNOSIS — E7800 Pure hypercholesterolemia, unspecified: Secondary | ICD-10-CM | POA: Diagnosis not present

## 2018-06-03 DIAGNOSIS — E559 Vitamin D deficiency, unspecified: Secondary | ICD-10-CM

## 2018-06-03 DIAGNOSIS — K219 Gastro-esophageal reflux disease without esophagitis: Secondary | ICD-10-CM

## 2018-06-04 DIAGNOSIS — M810 Age-related osteoporosis without current pathological fracture: Secondary | ICD-10-CM | POA: Diagnosis not present

## 2018-06-04 DIAGNOSIS — M1612 Unilateral primary osteoarthritis, left hip: Secondary | ICD-10-CM | POA: Diagnosis not present

## 2018-06-10 DIAGNOSIS — M810 Age-related osteoporosis without current pathological fracture: Secondary | ICD-10-CM | POA: Diagnosis not present

## 2018-06-10 DIAGNOSIS — M1612 Unilateral primary osteoarthritis, left hip: Secondary | ICD-10-CM | POA: Diagnosis not present

## 2018-06-16 ENCOUNTER — Ambulatory Visit (INDEPENDENT_AMBULATORY_CARE_PROVIDER_SITE_OTHER): Payer: Medicare Other

## 2018-06-16 DIAGNOSIS — E538 Deficiency of other specified B group vitamins: Secondary | ICD-10-CM | POA: Diagnosis not present

## 2018-06-16 MED ORDER — CYANOCOBALAMIN 1000 MCG/ML IJ SOLN
1000.0000 ug | Freq: Once | INTRAMUSCULAR | Status: AC
  Administered 2018-06-16: 1000 ug via INTRAMUSCULAR

## 2018-06-16 NOTE — Progress Notes (Addendum)
b12 injection given in left deltoid patient tolerated well.  Reviewed.  Dr Nicki Reaper

## 2018-07-21 ENCOUNTER — Ambulatory Visit (INDEPENDENT_AMBULATORY_CARE_PROVIDER_SITE_OTHER): Payer: Medicare Other

## 2018-07-21 ENCOUNTER — Other Ambulatory Visit: Payer: Self-pay | Admitting: Internal Medicine

## 2018-07-21 DIAGNOSIS — E538 Deficiency of other specified B group vitamins: Secondary | ICD-10-CM | POA: Diagnosis not present

## 2018-07-21 MED ORDER — CYANOCOBALAMIN 1000 MCG/ML IJ SOLN
1000.0000 ug | Freq: Once | INTRAMUSCULAR | Status: AC
  Administered 2018-07-21: 1000 ug via INTRAMUSCULAR

## 2018-07-21 NOTE — Progress Notes (Addendum)
Patient comes in for B 12 injection.  Injected right deltoid.  Patient tolerated injection well.   Reviewed.  Dr Scott 

## 2018-07-28 DIAGNOSIS — H353132 Nonexudative age-related macular degeneration, bilateral, intermediate dry stage: Secondary | ICD-10-CM | POA: Diagnosis not present

## 2018-07-28 DIAGNOSIS — H02059 Trichiasis without entropian unspecified eye, unspecified eyelid: Secondary | ICD-10-CM | POA: Diagnosis not present

## 2018-07-30 DIAGNOSIS — H02053 Trichiasis without entropian right eye, unspecified eyelid: Secondary | ICD-10-CM | POA: Diagnosis not present

## 2018-08-25 ENCOUNTER — Ambulatory Visit (INDEPENDENT_AMBULATORY_CARE_PROVIDER_SITE_OTHER): Payer: Medicare Other | Admitting: *Deleted

## 2018-08-25 DIAGNOSIS — E538 Deficiency of other specified B group vitamins: Secondary | ICD-10-CM | POA: Diagnosis not present

## 2018-08-25 DIAGNOSIS — Z23 Encounter for immunization: Secondary | ICD-10-CM

## 2018-08-25 MED ORDER — CYANOCOBALAMIN 1000 MCG/ML IJ SOLN
1000.0000 ug | Freq: Once | INTRAMUSCULAR | Status: AC
  Administered 2018-08-25: 1000 ug via INTRAMUSCULAR

## 2018-08-25 NOTE — Progress Notes (Signed)
Patient presented for B 12 injection to right deltoid, patient voiced no concerns nor showed any signs of distress during injection. 

## 2018-08-26 DIAGNOSIS — Z85828 Personal history of other malignant neoplasm of skin: Secondary | ICD-10-CM | POA: Diagnosis not present

## 2018-08-26 DIAGNOSIS — D1800 Hemangioma unspecified site: Secondary | ICD-10-CM | POA: Diagnosis not present

## 2018-08-26 DIAGNOSIS — L570 Actinic keratosis: Secondary | ICD-10-CM | POA: Diagnosis not present

## 2018-08-26 DIAGNOSIS — D485 Neoplasm of uncertain behavior of skin: Secondary | ICD-10-CM | POA: Diagnosis not present

## 2018-08-26 DIAGNOSIS — D3617 Benign neoplasm of peripheral nerves and autonomic nervous system of trunk, unspecified: Secondary | ICD-10-CM | POA: Diagnosis not present

## 2018-08-26 DIAGNOSIS — D3612 Benign neoplasm of peripheral nerves and autonomic nervous system, upper limb, including shoulder: Secondary | ICD-10-CM | POA: Diagnosis not present

## 2018-08-26 DIAGNOSIS — C44619 Basal cell carcinoma of skin of left upper limb, including shoulder: Secondary | ICD-10-CM | POA: Diagnosis not present

## 2018-08-26 DIAGNOSIS — C44311 Basal cell carcinoma of skin of nose: Secondary | ICD-10-CM | POA: Diagnosis not present

## 2018-08-26 DIAGNOSIS — L821 Other seborrheic keratosis: Secondary | ICD-10-CM | POA: Diagnosis not present

## 2018-08-26 DIAGNOSIS — L814 Other melanin hyperpigmentation: Secondary | ICD-10-CM | POA: Diagnosis not present

## 2018-09-10 DIAGNOSIS — L905 Scar conditions and fibrosis of skin: Secondary | ICD-10-CM | POA: Diagnosis not present

## 2018-09-10 DIAGNOSIS — D485 Neoplasm of uncertain behavior of skin: Secondary | ICD-10-CM | POA: Diagnosis not present

## 2018-09-10 DIAGNOSIS — C44311 Basal cell carcinoma of skin of nose: Secondary | ICD-10-CM | POA: Diagnosis not present

## 2018-09-10 DIAGNOSIS — C44619 Basal cell carcinoma of skin of left upper limb, including shoulder: Secondary | ICD-10-CM | POA: Diagnosis not present

## 2018-09-17 ENCOUNTER — Other Ambulatory Visit (INDEPENDENT_AMBULATORY_CARE_PROVIDER_SITE_OTHER): Payer: Medicare Other

## 2018-09-17 DIAGNOSIS — E559 Vitamin D deficiency, unspecified: Secondary | ICD-10-CM | POA: Diagnosis not present

## 2018-09-17 DIAGNOSIS — E7800 Pure hypercholesterolemia, unspecified: Secondary | ICD-10-CM | POA: Diagnosis not present

## 2018-09-17 LAB — HEPATIC FUNCTION PANEL
ALT: 12 U/L (ref 0–35)
AST: 14 U/L (ref 0–37)
Albumin: 4.4 g/dL (ref 3.5–5.2)
Alkaline Phosphatase: 58 U/L (ref 39–117)
BILIRUBIN TOTAL: 0.9 mg/dL (ref 0.2–1.2)
Bilirubin, Direct: 0.1 mg/dL (ref 0.0–0.3)
Total Protein: 6.7 g/dL (ref 6.0–8.3)

## 2018-09-17 LAB — LIPID PANEL
CHOLESTEROL: 160 mg/dL (ref 0–200)
HDL: 44.4 mg/dL (ref >39.00)
LDL CALC: 77 mg/dL (ref 0–99)
NonHDL: 115.66
Total CHOL/HDL Ratio: 4
Triglycerides: 195 mg/dL — ABNORMAL HIGH (ref 0.0–149.0)
VLDL: 39 mg/dL (ref 0.0–40.0)

## 2018-09-17 LAB — BASIC METABOLIC PANEL
BUN: 8 mg/dL (ref 6–23)
CALCIUM: 10.6 mg/dL — AB (ref 8.4–10.5)
CO2: 30 mEq/L (ref 19–32)
CREATININE: 0.93 mg/dL (ref 0.40–1.20)
Chloride: 103 mEq/L (ref 96–112)
GFR: 60.49 mL/min (ref >60.00)
Glucose, Bld: 101 mg/dL — ABNORMAL HIGH (ref 70–99)
Potassium: 3.6 mEq/L (ref 3.5–5.1)
Sodium: 141 mEq/L (ref 135–145)

## 2018-09-17 LAB — VITAMIN D 25 HYDROXY (VIT D DEFICIENCY, FRACTURES): VITD: 91 ng/mL (ref 30.00–100.00)

## 2018-09-17 LAB — TSH: TSH: 5.1 u[IU]/mL — ABNORMAL HIGH (ref 0.35–4.50)

## 2018-09-22 ENCOUNTER — Ambulatory Visit (INDEPENDENT_AMBULATORY_CARE_PROVIDER_SITE_OTHER): Payer: Medicare Other | Admitting: Internal Medicine

## 2018-09-22 ENCOUNTER — Encounter: Payer: Self-pay | Admitting: Internal Medicine

## 2018-09-22 ENCOUNTER — Ambulatory Visit: Payer: Medicare Other

## 2018-09-22 VITALS — BP 140/78 | HR 73 | Temp 97.9°F | Resp 18 | Wt 139.6 lb

## 2018-09-22 DIAGNOSIS — E210 Primary hyperparathyroidism: Secondary | ICD-10-CM

## 2018-09-22 DIAGNOSIS — Z9109 Other allergy status, other than to drugs and biological substances: Secondary | ICD-10-CM | POA: Diagnosis not present

## 2018-09-22 DIAGNOSIS — F439 Reaction to severe stress, unspecified: Secondary | ICD-10-CM

## 2018-09-22 DIAGNOSIS — Z0000 Encounter for general adult medical examination without abnormal findings: Secondary | ICD-10-CM | POA: Diagnosis not present

## 2018-09-22 DIAGNOSIS — M810 Age-related osteoporosis without current pathological fracture: Secondary | ICD-10-CM

## 2018-09-22 DIAGNOSIS — K219 Gastro-esophageal reflux disease without esophagitis: Secondary | ICD-10-CM | POA: Diagnosis not present

## 2018-09-22 DIAGNOSIS — E538 Deficiency of other specified B group vitamins: Secondary | ICD-10-CM

## 2018-09-22 DIAGNOSIS — E559 Vitamin D deficiency, unspecified: Secondary | ICD-10-CM | POA: Diagnosis not present

## 2018-09-22 DIAGNOSIS — E7800 Pure hypercholesterolemia, unspecified: Secondary | ICD-10-CM

## 2018-09-22 DIAGNOSIS — R7989 Other specified abnormal findings of blood chemistry: Secondary | ICD-10-CM

## 2018-09-22 MED ORDER — CYANOCOBALAMIN 1000 MCG/ML IJ SOLN
1000.0000 ug | Freq: Once | INTRAMUSCULAR | Status: AC
  Administered 2018-09-22: 1000 ug via INTRAMUSCULAR

## 2018-09-22 MED ORDER — ALPRAZOLAM 0.25 MG PO TABS: ORAL_TABLET | ORAL | 0 refills | Status: DC

## 2018-09-22 NOTE — Assessment & Plan Note (Signed)
Physical today 09/22/18.  Mammogram 10/29/17 - Birads I.  She will schedule.

## 2018-09-22 NOTE — Progress Notes (Signed)
Patient ID: Sabrina Cervantes, female   DOB: 1929/03/20, 84 y.o.   MRN: [id number]   Subjective:    Patient ID: Sabrina Cervantes, female    DOB: [birth year], 84 y.o.   MRN: [id number]  HPI  Patient with past history of hypercholesterolemia, osteoporosis and GERD.  She comes in today to follow up on these issues as well as for a complete physical exam.  She reports she is doing relatively well.  Tries to stay active.  No chest pain.  Breathing stable.  Some nasal congestion with allergies.  No chest congestion.  No sob.  No acid reflux.  No abdominal pain.  Bowels moving.  Discussed labs.  Slightly elevated calcium and thyroid.  Previously saw Dr Harlow Asa and is s/p parathyroidectomy.  Some increased stress.  Discussed with her today.   Overall she feels she is handling things relatively well.  Feels needs to have xanax rx to take prn.  Rarely takes.  Desires no further intervention.     Past Medical History:  Diagnosis Date  . Anemia   . Anxiety   . Arthritis    "left hip" (08/15/2016)  . Basal cell carcinoma    forehead & nose; Cancer Center cut them off; deep cutting  . Depression   . GERD (gastroesophageal reflux disease)   . Hepatitis    "think I had it as a child"  . History of kidney stones   . HOH (hard of hearing)   . Hypercholesterolemia   . Osteoporosis   . Pyelonephritis, acute     s/p ureteral stent  . Vasomotor rhinitis    chronic   Past Surgical History:  Procedure Laterality Date  . ABDOMINAL HYSTERECTOMY  1968   secondary to prolapse, ovaries not removed  . BASAL CELL CARCINOMA EXCISION     forehead & nose; Cancer Center cut them off; deep cutting  . CATARACT EXTRACTION, BILATERAL Bilateral   . COLONOSCOPY    . DILATION AND CURETTAGE OF UTERUS     S/P miscarriage  . LITHOTRIPSY     Dr Yves Dill, nephrolithiasis  . PARATHYROIDECTOMY Left 08/15/2016    inferior minimally invasive Archie Endo 08/15/2016  . PARATHYROIDECTOMY Left 08/15/2016   Procedure: LEFT INFERIOR PARATHYROIDECTOMY;   Surgeon: Armandina Gemma, MD;  Location: [facility];  Service: General;  Laterality: Left;  . RETINAL DETACHMENT SURGERY    . URETERAL STENT PLACEMENT     Family History  Problem Relation Age of Onset  . Skin cancer Father   . Colon cancer Brother   . Hypertension Sister   . Diabetes Mellitus II Sister   . Breast cancer Sister 23  . Breast cancer Unknown        niece   Social History   Socioeconomic History  . Marital status: Widowed    Spouse name: Not on file  . Number of children: 4  . Years of education: Not on file  . Highest education level: Not on file  Occupational History  . Not on file  Social Needs  . Financial resource strain: Not hard at all  . Food insecurity:    Worry: Patient refused    Inability: Patient refused  . Transportation needs:    Medical: No    Non-medical: No  Tobacco Use  . Smoking status: Never Smoker  . Smokeless tobacco: Never Used  Substance and Sexual Activity  . Alcohol use: No    Alcohol/week: 0.0 standard drinks  . Drug use: No  . Sexual activity: Never  Lifestyle  . Physical activity:    Days per week: Not on file    Minutes per session: Not on file  . Stress: Not at all  Relationships  . Social connections:    Talks on phone: Not on file    Gets together: Not on file    Attends religious service: Not on file    Active member of club or organization: Not on file    Attends meetings of clubs or organizations: Not on file    Relationship status: Not on file  Other Topics Concern  . Not on file  Social History Narrative  . Not on file    Outpatient Encounter Medications as of 09/22/2018  Medication Sig  . ALPRAZolam (XANAX) 0.25 MG tablet TAKE ONE TABLET BY MOUTH ONCE DAILY AS NEEDED  . azelastine (ASTELIN) 0.1 % nasal spray Place 1 spray into both nostrils 2 (two) times daily. Use in each nostril as directed  . Calcium Carbonate-Vitamin D (CALCIUM 600+D) 600-400 MG-UNIT per tablet Take 1 tablet by mouth 2 (two) times daily.  .  Cholecalciferol (VITAMIN D-3) 1000 UNITS CAPS Take 1,000 Units by mouth daily.   Marland Kitchen CRANBERRY PO Take 1 tablet by mouth daily.  . fish oil-omega-3 fatty acids 1000 MG capsule Take 1 g by mouth daily.  . mupirocin ointment (BACTROBAN) 2 % Apply to affected area bid  . OVER THE COUNTER MEDICATION Place 1 drop into both eyes daily as needed (for dry eyes).  . pantoprazole (PROTONIX) 40 MG tablet TAKE 1 TABLET BY MOUTH ONCE DAILY  . simvastatin (ZOCOR) 40 MG tablet Take 1 tablet (40 mg total) by mouth every evening.  . [DISCONTINUED] ALPRAZolam (XANAX) 0.25 MG tablet TAKE ONE TABLET BY MOUTH ONCE DAILY AS NEEDED  . [DISCONTINUED] ALPRAZolam (XANAX) 0.25 MG tablet TAKE ONE TABLET BY MOUTH ONCE DAILY AS NEEDED  . [EXPIRED] cyanocobalamin ((VITAMIN B-12)) injection 1,000 mcg    No facility-administered encounter medications on file as of 09/22/2018.     Review of Systems  Constitutional: Negative for appetite change and unexpected weight change.  HENT: Positive for congestion. Negative for sinus pressure.   Eyes: Negative for pain and visual disturbance.  Respiratory: Negative for cough, chest tightness and shortness of breath.   Cardiovascular: Negative for chest pain, palpitations and leg swelling.  Gastrointestinal: Negative for abdominal pain, diarrhea, nausea and vomiting.  Genitourinary: Negative for difficulty urinating and dysuria.  Musculoskeletal: Negative for joint swelling and myalgias.  Skin: Negative for color change and rash.  Neurological: Negative for dizziness, light-headedness and headaches.  Hematological: Negative for adenopathy. Does not bruise/bleed easily.  Psychiatric/Behavioral: Negative for agitation and dysphoric mood.       Objective:    Physical Exam  Constitutional: She is oriented to person, place, and time. She appears well-developed and well-nourished. No distress.  HENT:  Nose: Nose normal.  Mouth/Throat: Oropharynx is clear and moist.  Eyes: Right eye  exhibits no discharge. Left eye exhibits no discharge. No scleral icterus.  Neck: Neck supple. No thyromegaly present.  Cardiovascular: Normal rate and regular rhythm.  Pulmonary/Chest: Breath sounds normal. No accessory muscle usage. No tachypnea. No respiratory distress. She has no decreased breath sounds. She has no wheezes. She has no rhonchi. Right breast exhibits no inverted nipple, no mass, no nipple discharge and no tenderness (no axillary adenopathy). Left breast exhibits no inverted nipple, no mass, no nipple discharge and no tenderness (no axilarry adenopathy).  Abdominal: Soft. Bowel sounds are normal. There is no  tenderness.  Musculoskeletal: She exhibits no edema or tenderness.  Lymphadenopathy:    She has no cervical adenopathy.  Neurological: She is alert and oriented to person, place, and time.  Skin: No rash noted. No erythema.  Psychiatric: She has a normal mood and affect. Her behavior is normal.    BP 140/78 (BP Location: Left Arm, Patient Position: Sitting, Cuff Size: Normal)   Pulse 73   Temp 97.9 F (36.6 C) (Oral)   Resp 18   Wt 139 lb 9.6 oz (63.3 kg)   SpO2 99%   BMI 25.13 kg/m  Wt Readings from Last 3 Encounters:  09/22/18 139 lb 9.6 oz (63.3 kg)  05/04/18 141 lb 6.4 oz (64.1 kg)  03/03/18 142 lb 1.9 oz (64.5 kg)     Lab Results  Component Value Date   WBC 6.2 04/30/2018   HGB 12.1 04/30/2018   HCT 35.6 (L) 04/30/2018   PLT 272.0 04/30/2018   GLUCOSE 101 (H) 09/17/2018   CHOL 160 09/17/2018   TRIG 195.0 (H) 09/17/2018   HDL 44.40 09/17/2018   LDLDIRECT 78.0 04/30/2018   LDLCALC 77 09/17/2018   ALT 12 09/17/2018   AST 14 09/17/2018   NA 141 09/17/2018   K 3.6 09/17/2018   CL 103 09/17/2018   CREATININE 0.93 09/17/2018   BUN 8 09/17/2018   CO2 30 09/17/2018   TSH 5.10 (H) 09/17/2018    Mm Screening Breast Tomo Bilateral  Result Date: 10/29/2017 CLINICAL DATA:  Screening. EXAM: 2D DIGITAL SCREENING BILATERAL MAMMOGRAM WITH CAD AND  ADJUNCT TOMO COMPARISON:  Previous exam(s). ACR Breast Density Category b: There are scattered areas of fibroglandular density. FINDINGS: There are no findings suspicious for malignancy. Images were processed with CAD. IMPRESSION: No mammographic evidence of malignancy. A result letter of this screening mammogram will be mailed directly to the patient. RECOMMENDATION: Screening mammogram in one year. (Code:SM-B-01Y) BI-RADS CATEGORY  1: Negative. Electronically Signed   By: Marin Olp M.D.   On: 10/29/2017 16:45       Assessment & Plan:   Problem List Items Addressed This Visit    Environmental allergies    astelin as needed.  Follow.        GERD (gastroesophageal reflux disease)    Controlled on current regimen.  Follow.        Health care maintenance    Physical today 09/22/18.  Mammogram 10/29/17 - Birads I.  She will schedule.        Hypercholesterolemia    On simvastatin.  Low cholesterol diet and exercise.  Follow lipid panel and liver function tests.        Relevant Orders   TSH   Osteoporosis    Declines prescription medication.        Primary hyperparathyroidism ([place])    S/p parathyroidectomy.  Has been followed by Dr Harlow Asa.  Calcium on recent check slightly increased.  Recheck calcium.  Follow.        Relevant Orders   Calcium   Calcium, ionized   Stress    Discussed with her today.  Request rx for xanax to have prn.  Rarely takes.  Does not feel needs any further intervention.  Follow.        Vitamin D deficiency    Follow vitamin D level.         Other Visit Diagnoses    B12 deficiency    -  Primary   Relevant Medications   cyanocobalamin ((VITAMIN B-12)) injection 1,000 mcg (Completed)  Elevated TSH       Slightly elevated tsh noted on recent labs.  Recheck tsh with next labs.         Einar Pheasant, MD

## 2018-09-25 ENCOUNTER — Ambulatory Visit: Payer: Medicare Other

## 2018-09-28 ENCOUNTER — Other Ambulatory Visit: Payer: Self-pay | Admitting: Internal Medicine

## 2018-09-28 DIAGNOSIS — Z1231 Encounter for screening mammogram for malignant neoplasm of breast: Secondary | ICD-10-CM

## 2018-10-04 ENCOUNTER — Encounter: Payer: Self-pay | Admitting: Internal Medicine

## 2018-10-04 NOTE — Assessment & Plan Note (Signed)
On simvastatin.  Low cholesterol diet and exercise.  Follow lipid panel and liver function tests.   

## 2018-10-04 NOTE — Assessment & Plan Note (Signed)
Follow vitamin D level.  

## 2018-10-04 NOTE — Assessment & Plan Note (Signed)
Discussed with her today.  Request rx for xanax to have prn.  Rarely takes.  Does not feel needs any further intervention.  Follow.

## 2018-10-04 NOTE — Assessment & Plan Note (Signed)
Controlled on current regimen.  Follow.  

## 2018-10-04 NOTE — Assessment & Plan Note (Signed)
astelin as needed.  Follow.

## 2018-10-04 NOTE — Assessment & Plan Note (Signed)
Declines prescription medication.

## 2018-10-04 NOTE — Assessment & Plan Note (Signed)
S/p parathyroidectomy.  Has been followed by Dr Harlow Asa.  Calcium on recent check slightly increased.  Recheck calcium.  Follow.

## 2018-10-07 DIAGNOSIS — Z85828 Personal history of other malignant neoplasm of skin: Secondary | ICD-10-CM | POA: Diagnosis not present

## 2018-10-07 DIAGNOSIS — L570 Actinic keratosis: Secondary | ICD-10-CM | POA: Diagnosis not present

## 2018-10-07 DIAGNOSIS — C44311 Basal cell carcinoma of skin of nose: Secondary | ICD-10-CM | POA: Diagnosis not present

## 2018-10-07 DIAGNOSIS — B078 Other viral warts: Secondary | ICD-10-CM | POA: Diagnosis not present

## 2018-10-22 ENCOUNTER — Other Ambulatory Visit: Payer: Self-pay | Admitting: Internal Medicine

## 2018-10-22 ENCOUNTER — Ambulatory Visit (INDEPENDENT_AMBULATORY_CARE_PROVIDER_SITE_OTHER): Payer: Medicare Other

## 2018-10-22 DIAGNOSIS — E538 Deficiency of other specified B group vitamins: Secondary | ICD-10-CM

## 2018-10-22 MED ORDER — CYANOCOBALAMIN 1000 MCG/ML IJ SOLN
1000.0000 ug | Freq: Once | INTRAMUSCULAR | Status: AC
  Administered 2018-10-22: 1000 ug via INTRAMUSCULAR

## 2018-10-22 NOTE — Progress Notes (Signed)
Pt was seen today for NV for B-12 shot given IM in the RD. Pt tolerated well.

## 2018-10-30 ENCOUNTER — Ambulatory Visit
Admission: RE | Admit: 2018-10-30 | Discharge: 2018-10-30 | Disposition: A | Discharge status: Home / Self Care | Payer: Medicare Other | Source: Ambulatory Visit | Attending: Internal Medicine | Admitting: Internal Medicine

## 2018-10-30 DIAGNOSIS — Z1231 Encounter for screening mammogram for malignant neoplasm of breast: Secondary | ICD-10-CM | POA: Insufficient documentation

## 2018-11-06 DIAGNOSIS — L304 Erythema intertrigo: Secondary | ICD-10-CM | POA: Diagnosis not present

## 2018-11-18 DIAGNOSIS — L814 Other melanin hyperpigmentation: Secondary | ICD-10-CM | POA: Diagnosis not present

## 2018-11-18 DIAGNOSIS — C44311 Basal cell carcinoma of skin of nose: Secondary | ICD-10-CM | POA: Diagnosis not present

## 2018-11-18 DIAGNOSIS — L578 Other skin changes due to chronic exposure to nonionizing radiation: Secondary | ICD-10-CM | POA: Diagnosis not present

## 2018-11-18 DIAGNOSIS — Z85828 Personal history of other malignant neoplasm of skin: Secondary | ICD-10-CM | POA: Diagnosis not present

## 2018-11-19 ENCOUNTER — Ambulatory Visit (INDEPENDENT_AMBULATORY_CARE_PROVIDER_SITE_OTHER): Payer: Medicare Other

## 2018-11-19 DIAGNOSIS — E538 Deficiency of other specified B group vitamins: Secondary | ICD-10-CM | POA: Diagnosis not present

## 2018-11-19 MED ORDER — CYANOCOBALAMIN 1000 MCG/ML IJ SOLN
1000.0000 ug | Freq: Once | INTRAMUSCULAR | Status: AC
  Administered 2018-11-19: 1000 ug via INTRAMUSCULAR

## 2018-11-19 NOTE — Progress Notes (Signed)
Pateint received B-12 injection today in left deltoid. Patient tolerated well.

## 2018-11-20 ENCOUNTER — Telehealth: Payer: Self-pay | Admitting: *Deleted

## 2018-11-20 NOTE — Telephone Encounter (Signed)
Copied from [place]. Topic: Appointment Scheduling - Scheduling Inquiry for Clinic >> Nov 20, 2018 11:36 AM Berneta Levins wrote: Reason for CRM:   Pt's daughter, [name], calling in.  States pt was in yesterday for B12 injection and another appt wasn't made.  [name] would like to know when they need to come back for next B12 so process doesn't have to be restarted all over again.  [name] can be reached at [phone] or [phone], OK to leave a message at either number.

## 2018-11-23 NOTE — Telephone Encounter (Signed)
Left message for patients daughter letting her know that I can schedule b12 at appt on 1/23, she will not be due until the end of Feb

## 2018-11-24 ENCOUNTER — Other Ambulatory Visit: Payer: Medicare Other

## 2018-11-26 ENCOUNTER — Encounter: Payer: Self-pay | Admitting: Internal Medicine

## 2018-11-26 ENCOUNTER — Ambulatory Visit (INDEPENDENT_AMBULATORY_CARE_PROVIDER_SITE_OTHER): Payer: Medicare Other | Admitting: Internal Medicine

## 2018-11-26 DIAGNOSIS — E559 Vitamin D deficiency, unspecified: Secondary | ICD-10-CM

## 2018-11-26 DIAGNOSIS — Z9109 Other allergy status, other than to drugs and biological substances: Secondary | ICD-10-CM

## 2018-11-26 DIAGNOSIS — M810 Age-related osteoporosis without current pathological fracture: Secondary | ICD-10-CM | POA: Diagnosis not present

## 2018-11-26 DIAGNOSIS — K219 Gastro-esophageal reflux disease without esophagitis: Secondary | ICD-10-CM | POA: Diagnosis not present

## 2018-11-26 DIAGNOSIS — F439 Reaction to severe stress, unspecified: Secondary | ICD-10-CM

## 2018-11-26 DIAGNOSIS — E210 Primary hyperparathyroidism: Secondary | ICD-10-CM | POA: Diagnosis not present

## 2018-11-26 DIAGNOSIS — E7800 Pure hypercholesterolemia, unspecified: Secondary | ICD-10-CM | POA: Diagnosis not present

## 2018-11-26 NOTE — Progress Notes (Signed)
Patient ID: Sabrina Cervantes, female   DOB: December 18, 1928, 85 y.o.   MRN: [id number]   Subjective:    Patient ID: Sabrina Cervantes, female    DOB: 11-05-1928, 85 y.o.   MRN: [id number]  HPI  Patient here for a scheduled follow up appt.  She is accompanied by her her daughter Balinda Quails - phone [phone]).   History obtained from both of them.  Is s/p nasal surgery - to remove nasal lesion.  S/p skin grafting.  Continue f/u with dermatology.  Was having some left buttock pain and left leg pain.  Previous xray with compression fracture.  Pain is better.  She is able to do her ADLs without problems.  No chest pain.  No sob.  No acid reflux.  No abdominal pain.  Bowels moving.     Past Medical History:  Diagnosis Date  . Anemia   . Anxiety   . Arthritis    "left hip" (08/15/2016)  . Basal cell carcinoma    forehead & nose; [facility] cut them off; deep cutting  . Depression   . GERD (gastroesophageal reflux disease)   . Hepatitis    "think I had it as a child"  . History of kidney stones   . HOH (hard of hearing)   . Hypercholesterolemia   . Osteoporosis   . Pyelonephritis, acute     s/p ureteral stent  . Vasomotor rhinitis    chronic   Past Surgical History:  Procedure Laterality Date  . ABDOMINAL HYSTERECTOMY  1968   secondary to prolapse, ovaries not removed  . BASAL CELL CARCINOMA EXCISION     forehead & nose; [facility] cut them off; deep cutting  . CATARACT EXTRACTION, BILATERAL Bilateral   . COLONOSCOPY    . DILATION AND CURETTAGE OF UTERUS     S/P miscarriage  . LITHOTRIPSY     Dr Yves Dill, nephrolithiasis  . PARATHYROIDECTOMY Left 08/15/2016    inferior minimally invasive Archie Endo 08/15/2016  . PARATHYROIDECTOMY Left 08/15/2016   Procedure: LEFT INFERIOR PARATHYROIDECTOMY;  Surgeon: Armandina Gemma, MD;  Location: [facility];  Service: General;  Laterality: Left;  . RETINAL DETACHMENT SURGERY    . URETERAL STENT PLACEMENT     Family History  Problem Relation Age of Onset  . Skin  cancer Father   . Colon cancer Brother   . Hypertension Sister   . Diabetes Mellitus II Sister   . Breast cancer Sister 57  . Breast cancer Other        niece   Social History   Socioeconomic History  . Marital status: Widowed    Spouse name: Not on file  . Number of children: 4  . Years of education: Not on file  . Highest education level: Not on file  Occupational History  . Not on file  Social Needs  . Financial resource strain: Not hard at all  . Food insecurity:    Worry: Patient refused    Inability: Patient refused  . Transportation needs:    Medical: No    Non-medical: No  Tobacco Use  . Smoking status: Never Smoker  . Smokeless tobacco: Never Used  Substance and Sexual Activity  . Alcohol use: No    Alcohol/week: 0.0 standard drinks  . Drug use: No  . Sexual activity: Never  Lifestyle  . Physical activity:    Days per week: Not on file    Minutes per session: Not on file  . Stress: Not at all  Relationships  . Social connections:    Talks on phone: Not on file    Gets together: Not on file    Attends religious service: Not on file    Active member of club or organization: Not on file    Attends meetings of clubs or organizations: Not on file    Relationship status: Not on file  Other Topics Concern  . Not on file  Social History Narrative  . Not on file    Outpatient Encounter Medications as of 11/26/2018  Medication Sig  . ALPRAZolam (XANAX) 0.25 MG tablet TAKE ONE TABLET BY MOUTH ONCE DAILY AS NEEDED  . azelastine (ASTELIN) 0.1 % nasal spray Place 1 spray into both nostrils 2 (two) times daily. Use in each nostril as directed  . Calcium Carbonate-Vitamin D (CALCIUM 600+D) 600-400 MG-UNIT per tablet Take 1 tablet by mouth 2 (two) times daily.  . Cholecalciferol (VITAMIN D-3) 1000 UNITS CAPS Take 1,000 Units by mouth daily.   Marland Kitchen CRANBERRY PO Take 1 tablet by mouth daily.  . fish oil-omega-3 fatty acids 1000 MG capsule Take 1 g by mouth daily.  .  mupirocin ointment (BACTROBAN) 2 % Apply to affected area bid  . OVER THE COUNTER MEDICATION Place 1 drop into both eyes daily as needed (for dry eyes).  . pantoprazole (PROTONIX) 40 MG tablet TAKE 1 TABLET BY MOUTH ONCE DAILY  . simvastatin (ZOCOR) 40 MG tablet TAKE 1 TABLET BY MOUTH IN THE EVENING   No facility-administered encounter medications on file as of 11/26/2018.     Review of Systems  Constitutional: Negative for appetite change and unexpected weight change.  HENT: Positive for congestion and postnasal drip.   Respiratory: Negative for cough, chest tightness and shortness of breath.   Cardiovascular: Negative for chest pain, palpitations and leg swelling.  Gastrointestinal: Negative for abdominal pain, diarrhea, nausea and vomiting.  Genitourinary: Negative for difficulty urinating and dysuria.  Musculoskeletal:       Back, buttock and leg pain improved.    Skin: Negative for color change and rash.  Neurological: Negative for dizziness, light-headedness and headaches.  Psychiatric/Behavioral: Negative for agitation and dysphoric mood.       Objective:    Physical Exam Constitutional:      General: She is not in acute distress.    Appearance: Normal appearance.  HENT:     Nose: Nose normal. No congestion.     Mouth/Throat:     Pharynx: No oropharyngeal exudate or posterior oropharyngeal erythema.  Neck:     Musculoskeletal: Neck supple. No muscular tenderness.     Thyroid: No thyromegaly.  Cardiovascular:     Rate and Rhythm: Normal rate and regular rhythm.  Pulmonary:     Effort: No respiratory distress.     Breath sounds: Normal breath sounds. No wheezing.  Abdominal:     General: Bowel sounds are normal.     Palpations: Abdomen is soft.     Tenderness: There is no abdominal tenderness.  Musculoskeletal:        General: No swelling or tenderness.  Lymphadenopathy:     Cervical: No cervical adenopathy.  Skin:    Findings: No erythema or rash.    Neurological:     Mental Status: She is alert.  Psychiatric:        Mood and Affect: Mood normal.        Behavior: Behavior normal.     BP 128/74 (BP Location: Left Arm, Patient Position: Sitting, Cuff Size: Normal)  Pulse 71   Temp 97.8 F (36.6 C) (Oral)   Resp 16   Wt 138 lb 6.4 oz (62.8 kg)   SpO2 98%   BMI 24.91 kg/m  Wt Readings from Last 3 Encounters:  11/26/18 138 lb 6.4 oz (62.8 kg)  09/22/18 139 lb 9.6 oz (63.3 kg)  05/04/18 141 lb 6.4 oz (64.1 kg)     Lab Results  Component Value Date   WBC 6.2 04/30/2018   HGB 12.1 04/30/2018   HCT 35.6 (L) 04/30/2018   PLT 272.0 04/30/2018   GLUCOSE 95 11/26/2018   CHOL 160 09/17/2018   TRIG 195.0 (H) 09/17/2018   HDL 44.40 09/17/2018   LDLDIRECT 78.0 04/30/2018   LDLCALC 77 09/17/2018   ALT 12 09/17/2018   AST 14 09/17/2018   NA 140 11/26/2018   K 3.6 11/26/2018   CL 103 11/26/2018   CREATININE 0.86 11/26/2018   BUN 13 11/26/2018   CO2 29 11/26/2018   TSH 3.50 11/26/2018    Mm 3d Screen Breast Bilateral  Result Date: 10/30/2018 CLINICAL DATA:  Screening. EXAM: DIGITAL SCREENING BILATERAL MAMMOGRAM WITH TOMO AND CAD COMPARISON:  Previous exam(s). ACR Breast Density Category b: There are scattered areas of fibroglandular density. FINDINGS: There are no findings suspicious for malignancy. Images were processed with CAD. IMPRESSION: No mammographic evidence of malignancy. A result letter of this screening mammogram will be mailed directly to the patient. RECOMMENDATION: Screening mammogram in one year. (Code:SM-B-01Y) BI-RADS CATEGORY  1: Negative. Electronically Signed   By: Fidela Salisbury M.D.   On: 10/30/2018 16:57       Assessment & Plan:   Problem List Items Addressed This Visit    Environmental allergies    Discussed using astelin.  Follow.        GERD (gastroesophageal reflux disease)    Controlled on current regimen.  Follow.        Hypercholesterolemia    On simvastatin.  Low cholesterol  diet and exercise.  Follow lipid panel and liver function tests.        Osteoporosis    Has previously declined prescription medication.  With recent xray revealing compression fracture and bone density with osteoporosis.  Discussed treatment options.  Form completion for reclast.       Primary hyperparathyroidism ([place])    S/p parathyroidectomy.  Was followed by Dr Harlow Asa.  Calcium slightly increased.  Recheck calcium and ionized calcium.  Follow.        Stress    Overall feels she is handling things well.  Follow.        Vitamin D deficiency    Follow vitamin D level.         Other Visit Diagnoses    Hypercalcemia    -  Primary   Relevant Orders   Basic metabolic panel (Completed)   Urinalysis, Routine w reflex microscopic (Completed)   TSH (Completed)   Calcium, ionized (Completed)       Einar Pheasant, MD

## 2018-11-27 LAB — BASIC METABOLIC PANEL
BUN: 13 mg/dL (ref 6–23)
CHLORIDE: 103 meq/L (ref 96–112)
CO2: 29 mEq/L (ref 19–32)
Calcium: 10.1 mg/dL (ref 8.4–10.5)
Creatinine, Ser: 0.86 mg/dL (ref 0.40–1.20)
GFR: 62.26 mL/min (ref >60.00)
Glucose, Bld: 95 mg/dL (ref 70–99)
Potassium: 3.6 mEq/L (ref 3.5–5.1)
Sodium: 140 mEq/L (ref 135–145)

## 2018-11-27 LAB — URINALYSIS, ROUTINE W REFLEX MICROSCOPIC
Bilirubin Urine: NEGATIVE
Ketones, ur: NEGATIVE
Nitrite: NEGATIVE
Specific Gravity, Urine: <=1.005 — AB (ref 1.000–1.030)
Total Protein, Urine: NEGATIVE
Urine Glucose: NEGATIVE
Urobilinogen, UA: 0.2 (ref 0.0–1.0)
pH: 6.5 (ref 5.0–8.0)

## 2018-11-27 LAB — TSH: TSH: 3.5 u[IU]/mL (ref 0.35–4.50)

## 2018-11-27 LAB — CALCIUM, IONIZED: Calcium, Ion: 5.61 mg/dL — ABNORMAL HIGH (ref 4.8–5.6)

## 2018-11-29 ENCOUNTER — Encounter: Payer: Self-pay | Admitting: Internal Medicine

## 2018-11-29 NOTE — Assessment & Plan Note (Signed)
S/p parathyroidectomy.  Was followed by Dr Harlow Asa.  Calcium slightly increased.  Recheck calcium and ionized calcium.  Follow.

## 2018-11-29 NOTE — Assessment & Plan Note (Signed)
Overall feels she is handling things well.  Follow.

## 2018-11-29 NOTE — Assessment & Plan Note (Signed)
On simvastatin.  Low cholesterol diet and exercise.  Follow lipid panel and liver function tests.   

## 2018-11-29 NOTE — Assessment & Plan Note (Signed)
Discussed using astelin.  Follow.

## 2018-11-29 NOTE — Assessment & Plan Note (Addendum)
Has previously declined prescription medication.  With recent xray revealing compression fracture and bone density with osteoporosis.  Discussed treatment options.  Form completion for reclast.

## 2018-11-29 NOTE — Assessment & Plan Note (Signed)
Controlled on current regimen.  Follow.  

## 2018-11-29 NOTE — Assessment & Plan Note (Signed)
Follow vitamin D level.  

## 2018-11-30 ENCOUNTER — Other Ambulatory Visit: Payer: Self-pay | Admitting: Internal Medicine

## 2018-11-30 NOTE — Progress Notes (Signed)
Order placed for f/u calcium and intact PTH

## 2018-12-08 ENCOUNTER — Telehealth: Payer: Self-pay | Admitting: *Deleted

## 2018-12-08 NOTE — Telephone Encounter (Signed)
Copied from [place] [id number]. Topic: General - Inquiry >> Dec 08, 2018 12:23 PM Vernona Rieger wrote: Reason for CRM: patient's sister wanted to know does she need to keep her lab appt for 3/4

## 2018-12-08 NOTE — Telephone Encounter (Signed)
LMTCB

## 2018-12-09 NOTE — Telephone Encounter (Signed)
Pt sister calling back. Please call back at office [phone]

## 2018-12-10 NOTE — Telephone Encounter (Signed)
Patients daughter aware that lab appt was not needed. She is going to have labs done at nurse visit

## 2018-12-17 ENCOUNTER — Ambulatory Visit (INDEPENDENT_AMBULATORY_CARE_PROVIDER_SITE_OTHER): Payer: Medicare Other

## 2018-12-17 ENCOUNTER — Other Ambulatory Visit: Payer: Medicare Other

## 2018-12-17 DIAGNOSIS — E538 Deficiency of other specified B group vitamins: Secondary | ICD-10-CM | POA: Diagnosis not present

## 2018-12-17 MED ORDER — CYANOCOBALAMIN 1000 MCG/ML IJ SOLN
1000.0000 ug | Freq: Once | INTRAMUSCULAR | Status: AC
  Administered 2018-12-17: 1000 ug via INTRAMUSCULAR

## 2018-12-17 NOTE — Progress Notes (Addendum)
Patient presented for B 12 injection to left deltoid, patient voiced no concerns nor showed any signs of distress during injection.  Reviewed.  Dr Scott 

## 2018-12-17 NOTE — Addendum Note (Signed)
Addended by: Arby Barrette on: 12/17/2018 02:22 PM   Modules accepted: Orders

## 2018-12-18 LAB — PARATHYROID HORMONE, INTACT (NO CA): PTH: 19 pg/mL (ref 14–64)

## 2018-12-18 LAB — CALCIUM: Calcium: 9.9 mg/dL (ref 8.6–10.4)

## 2018-12-30 IMAGING — MG DIGITAL SCREENING BILATERAL MAMMOGRAM WITH TOMO AND CAD
8 series · 8 of 24 positions shown · non-contrast
Comparison: Previous exam(s).

CLINICAL DATA: Screening.

EXAM:
DIGITAL SCREENING BILATERAL MAMMOGRAM WITH TOMO AND CAD

[L MLO synth-2D]
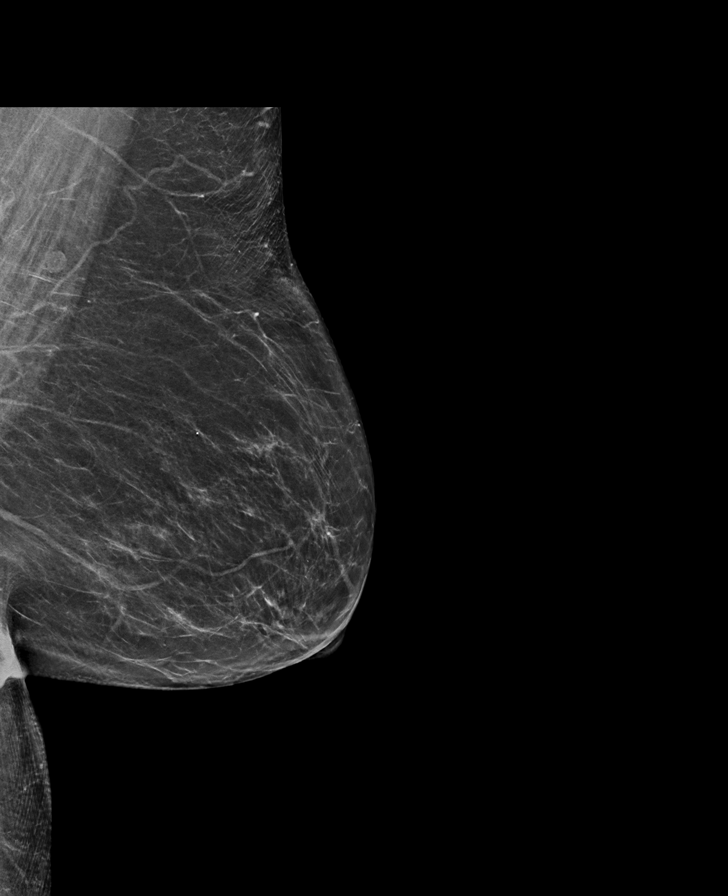

[R CC synth-2D]
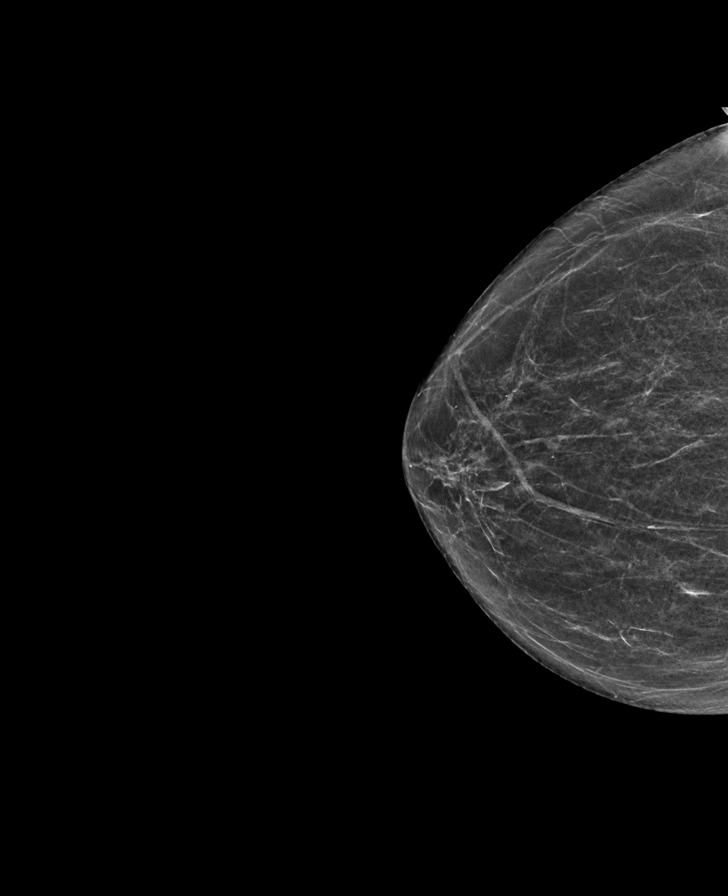

[R MLO synth-2D]
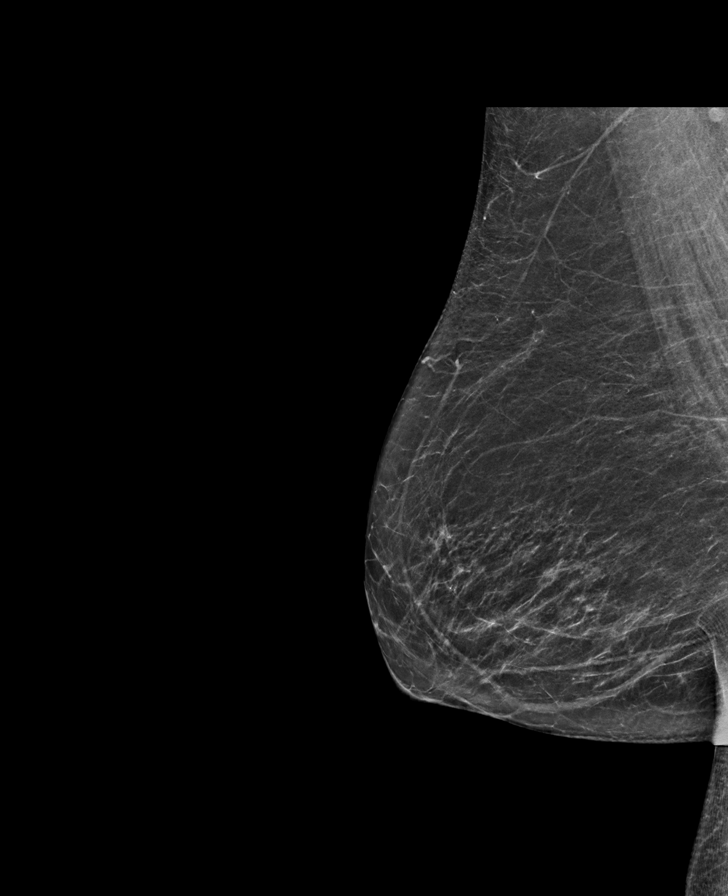

[L CC synth-2D]
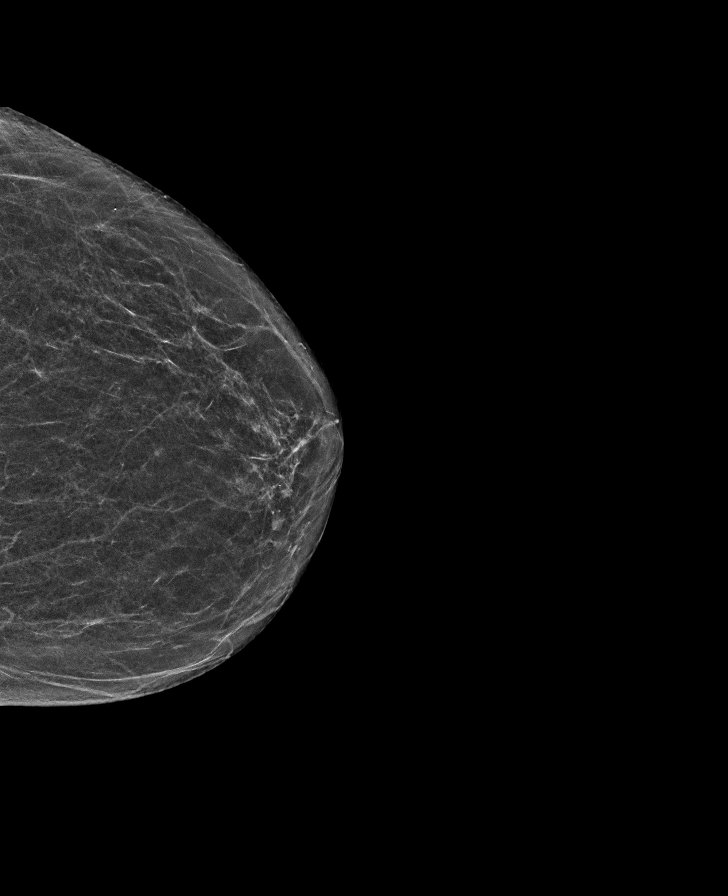

[R MLO tomo · tomo slice 33/64.0]
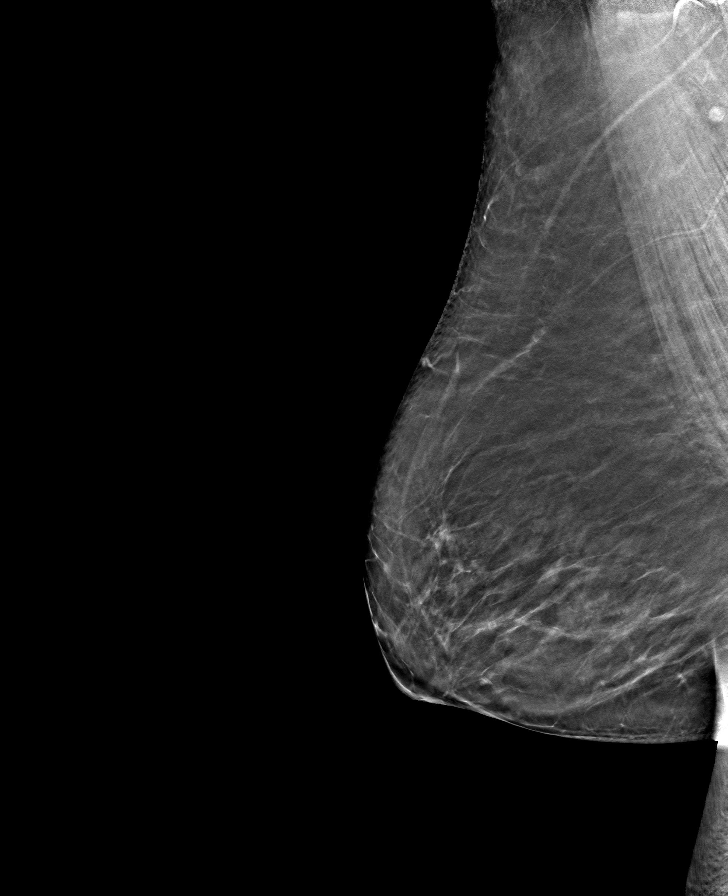

[R CC tomo · tomo slice 27/53.0]
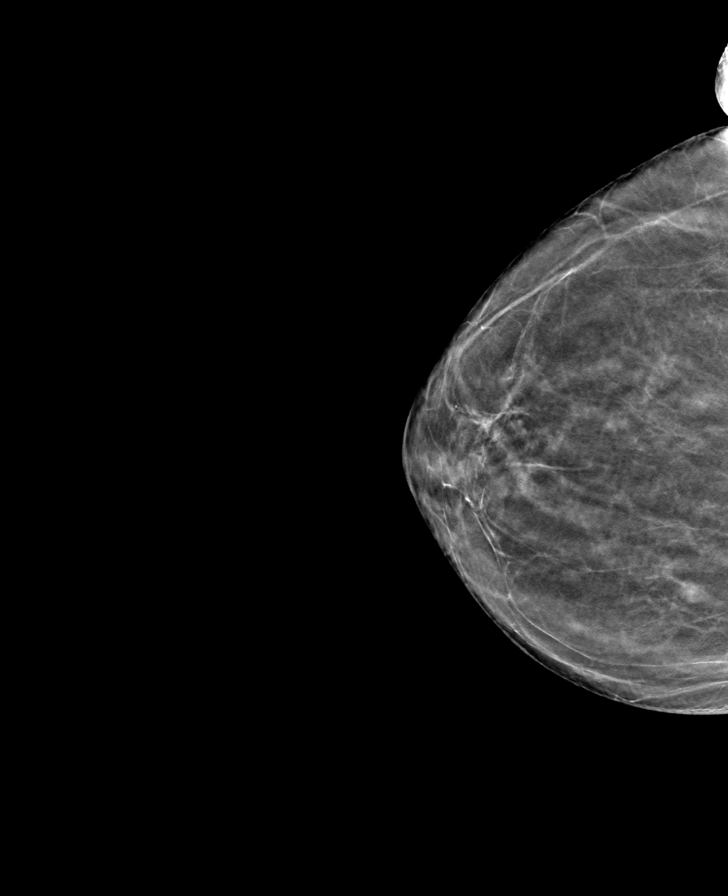

[L CC tomo · tomo slice 27/52.0]
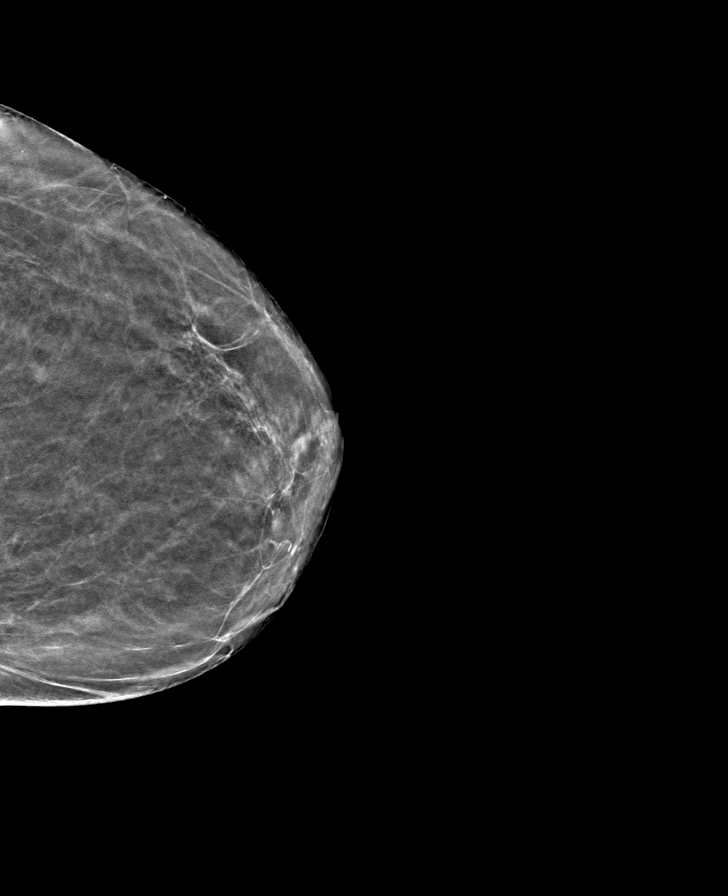

[L MLO tomo · tomo slice 35/68.0]
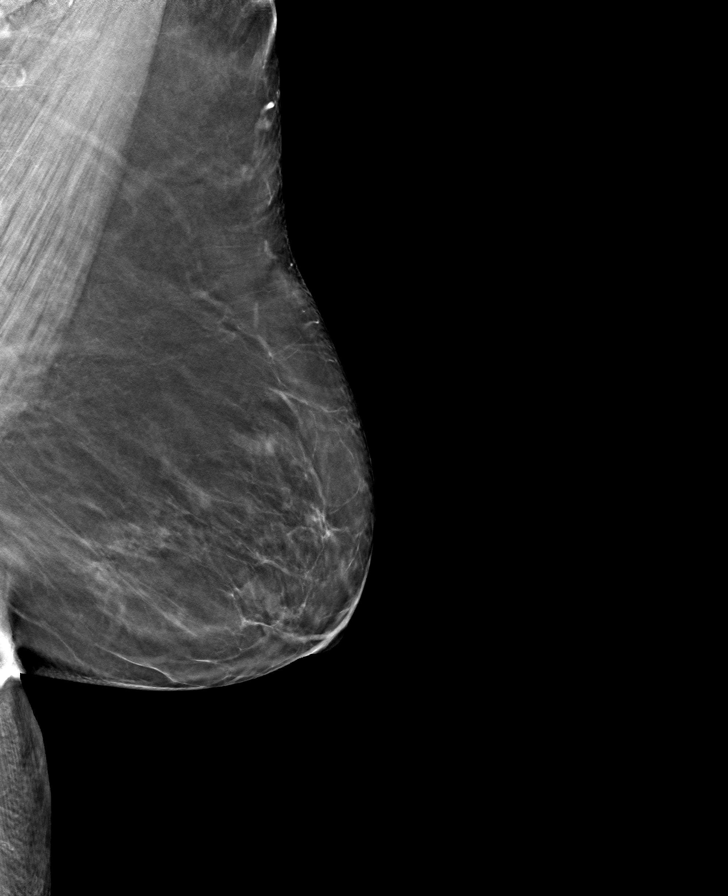

[8 of 24 positions shown; findings below may reference images not displayed]

ACR Breast Density Category b: There are scattered areas of
fibroglandular density.
FINDINGS: There are no findings suspicious for malignancy. Images were
processed with CAD.
IMPRESSION: No mammographic evidence of malignancy. A result letter of this
screening mammogram will be mailed directly to the patient.

RECOMMENDATION:
Screening mammogram in one year. (Code:CN-U-775)

BI-RADS CATEGORY  1: Negative.

## 2019-01-06 ENCOUNTER — Other Ambulatory Visit: Payer: Medicare Other

## 2019-01-19 ENCOUNTER — Other Ambulatory Visit: Payer: Self-pay

## 2019-01-19 ENCOUNTER — Ambulatory Visit (INDEPENDENT_AMBULATORY_CARE_PROVIDER_SITE_OTHER): Payer: Medicare Other

## 2019-01-19 DIAGNOSIS — E538 Deficiency of other specified B group vitamins: Secondary | ICD-10-CM | POA: Diagnosis not present

## 2019-01-19 MED ORDER — CYANOCOBALAMIN 1000 MCG/ML IJ SOLN
1000.0000 ug | Freq: Once | INTRAMUSCULAR | Status: AC
  Administered 2019-01-19: 1000 ug via INTRAMUSCULAR

## 2019-01-19 NOTE — Progress Notes (Addendum)
Sabrina Cervantes presents today for injection per MD orders. B12 injection administered IM in right Upper Arm. Administration without incident. Patient tolerated well. Nina,cma  Reviewed.   Dr Nicki Reaper

## 2019-01-20 ENCOUNTER — Other Ambulatory Visit: Payer: Medicare Other

## 2019-01-22 ENCOUNTER — Ambulatory Visit: Payer: Medicare Other | Admitting: Internal Medicine

## 2019-01-27 ENCOUNTER — Other Ambulatory Visit: Payer: Self-pay | Admitting: Internal Medicine

## 2019-02-23 ENCOUNTER — Ambulatory Visit (INDEPENDENT_AMBULATORY_CARE_PROVIDER_SITE_OTHER): Payer: Medicare Other

## 2019-02-23 ENCOUNTER — Other Ambulatory Visit: Payer: Self-pay

## 2019-02-23 DIAGNOSIS — E538 Deficiency of other specified B group vitamins: Secondary | ICD-10-CM

## 2019-02-23 MED ORDER — CYANOCOBALAMIN 1000 MCG/ML IJ SOLN
1000.0000 ug | Freq: Once | INTRAMUSCULAR | Status: AC
  Administered 2019-02-23: 1000 ug via INTRAMUSCULAR

## 2019-02-23 NOTE — Progress Notes (Addendum)
Patient presented today for B12 injection.  Administered IM in left deltoid.  Patient tolerated well.  Reviewed.  Dr Nicki Reaper

## 2019-03-05 ENCOUNTER — Ambulatory Visit: Payer: Medicare Other

## 2019-03-05 ENCOUNTER — Ambulatory Visit: Payer: Medicare Other | Admitting: Internal Medicine

## 2019-03-08 ENCOUNTER — Ambulatory Visit: Payer: Medicare Other

## 2019-03-11 ENCOUNTER — Ambulatory Visit: Payer: Medicare Other

## 2019-03-11 ENCOUNTER — Ambulatory Visit: Payer: Medicare Other | Admitting: Internal Medicine

## 2019-03-25 ENCOUNTER — Ambulatory Visit (INDEPENDENT_AMBULATORY_CARE_PROVIDER_SITE_OTHER): Payer: Medicare Other

## 2019-03-25 ENCOUNTER — Other Ambulatory Visit: Payer: Self-pay

## 2019-03-25 DIAGNOSIS — E538 Deficiency of other specified B group vitamins: Secondary | ICD-10-CM

## 2019-03-25 MED ORDER — CYANOCOBALAMIN 1000 MCG/ML IJ SOLN
1000.0000 ug | Freq: Once | INTRAMUSCULAR | Status: AC
  Administered 2019-03-25: 1000 ug via INTRAMUSCULAR

## 2019-03-25 NOTE — Progress Notes (Addendum)
Sabrina Cervantes presents today for injection per MD orders. B12 injeciton administered IM in left Upper Arm. Administration without incident. Patient tolerated well.  Sabrina Cervantes,cma  Reviewed.  Dr Nicki Reaper

## 2019-04-27 ENCOUNTER — Other Ambulatory Visit: Payer: Self-pay

## 2019-04-27 ENCOUNTER — Ambulatory Visit (INDEPENDENT_AMBULATORY_CARE_PROVIDER_SITE_OTHER): Payer: Medicare Other

## 2019-04-27 DIAGNOSIS — E538 Deficiency of other specified B group vitamins: Secondary | ICD-10-CM

## 2019-04-27 MED ORDER — CYANOCOBALAMIN 1000 MCG/ML IJ SOLN
1000.0000 ug | Freq: Once | INTRAMUSCULAR | Status: AC
  Administered 2019-04-27: 1000 ug via INTRAMUSCULAR

## 2019-04-27 NOTE — Progress Notes (Addendum)
Patient presented today for B12 injection.  Administered IM in the right deltoid.  Administered without incident.  Patient tolerated well with no signs of distress.  Reviewed.  Dr Nicki Reaper

## 2019-05-01 ENCOUNTER — Other Ambulatory Visit: Payer: Self-pay | Admitting: Internal Medicine

## 2019-05-20 DIAGNOSIS — H40003 Preglaucoma, unspecified, bilateral: Secondary | ICD-10-CM | POA: Diagnosis not present

## 2019-05-27 DIAGNOSIS — H02059 Trichiasis without entropian unspecified eye, unspecified eyelid: Secondary | ICD-10-CM | POA: Diagnosis not present

## 2019-05-27 DIAGNOSIS — H353132 Nonexudative age-related macular degeneration, bilateral, intermediate dry stage: Secondary | ICD-10-CM | POA: Diagnosis not present

## 2019-06-01 ENCOUNTER — Ambulatory Visit (INDEPENDENT_AMBULATORY_CARE_PROVIDER_SITE_OTHER): Payer: Medicare Other

## 2019-06-01 ENCOUNTER — Other Ambulatory Visit: Payer: Self-pay

## 2019-06-01 DIAGNOSIS — E538 Deficiency of other specified B group vitamins: Secondary | ICD-10-CM

## 2019-06-01 MED ORDER — CYANOCOBALAMIN 1000 MCG/ML IJ SOLN
1000.0000 ug | Freq: Once | INTRAMUSCULAR | Status: AC
  Administered 2019-06-01: 15:00:00 1000 ug via INTRAMUSCULAR

## 2019-06-01 NOTE — Progress Notes (Addendum)
Patient presented today for B12 injection.  Administered IM in left deltoid.  Patient tolerated well with no signs of distress.  Reviewed.  Dr Scott  

## 2019-06-14 ENCOUNTER — Ambulatory Visit: Payer: Medicare Other

## 2019-06-14 ENCOUNTER — Ambulatory Visit: Payer: Medicare Other | Admitting: Internal Medicine

## 2019-06-15 ENCOUNTER — Other Ambulatory Visit: Payer: Self-pay

## 2019-06-15 ENCOUNTER — Encounter: Payer: Self-pay | Admitting: Internal Medicine

## 2019-06-15 ENCOUNTER — Ambulatory Visit (INDEPENDENT_AMBULATORY_CARE_PROVIDER_SITE_OTHER): Payer: Medicare Other | Admitting: Internal Medicine

## 2019-06-15 DIAGNOSIS — M25562 Pain in left knee: Secondary | ICD-10-CM | POA: Diagnosis not present

## 2019-06-15 DIAGNOSIS — Z9109 Other allergy status, other than to drugs and biological substances: Secondary | ICD-10-CM | POA: Diagnosis not present

## 2019-06-15 DIAGNOSIS — M810 Age-related osteoporosis without current pathological fracture: Secondary | ICD-10-CM | POA: Diagnosis not present

## 2019-06-15 DIAGNOSIS — F439 Reaction to severe stress, unspecified: Secondary | ICD-10-CM

## 2019-06-15 DIAGNOSIS — E210 Primary hyperparathyroidism: Secondary | ICD-10-CM | POA: Diagnosis not present

## 2019-06-15 DIAGNOSIS — E559 Vitamin D deficiency, unspecified: Secondary | ICD-10-CM | POA: Diagnosis not present

## 2019-06-15 DIAGNOSIS — G8929 Other chronic pain: Secondary | ICD-10-CM | POA: Diagnosis not present

## 2019-06-15 DIAGNOSIS — E7800 Pure hypercholesterolemia, unspecified: Secondary | ICD-10-CM | POA: Diagnosis not present

## 2019-06-15 DIAGNOSIS — K219 Gastro-esophageal reflux disease without esophagitis: Secondary | ICD-10-CM

## 2019-06-15 MED ORDER — SERTRALINE HCL 25 MG PO TABS: 25.0000 mg | ORAL_TABLET | Freq: Every day | ORAL | 1 refills | Status: DC

## 2019-06-15 NOTE — Progress Notes (Signed)
Patient ID: Sabrina Cervantes, female   DOB: Oct 20, 1929, 85 y.o.   MRN: [id number]   Virtual Visit via telephone Note  This visit type was conducted due to national recommendations for restrictions regarding the COVID-19 pandemic (e.g. social distancing).  This format is felt to be most appropriate for this patient at this time.  All issues noted in this document were discussed and addressed.  No physical exam was performed (except for noted visual exam findings with Video Visits).   I connected with Jannifer Hick by telephone and verified that I am speaking with the correct person using two identifiers. Location patient: home Location provider: work Persons participating in the telephone visit: patient, provider  I discussed the limitations, risks, security and privacy concerns of performing an evaluation and management service by telephone and the availability of in person appointments. The patient expressed understanding and agreed to proceed.   Reason for visit: scheduled follow up.   HPI: She reports increased stress.  She has been staying in due to covid.  Not going to church.  Some increased anxiety.  Trouble sleeping.  States she cannot relax. Feels needs something to help level things out.  She is eating.  No nausea or vomiting.  Bowels moving.  No chest pain.  Breathing stable.  No acid reflux reported.  Her son lives with her.  Some increased stress at times related to this.  Reports increased pain in her left knee.  Previously we xrayed her hip and back.  States the pain now is more localized to her left knee. Discussed further w/up.  She wants to hold on any further w/up or evaluation now.  Did agree to xray of her knee when she comes in for next B12.      ROS: See pertinent positives and negatives per HPI.  Past Medical History:  Diagnosis Date  . Anemia   . Anxiety   . Arthritis    "left hip" (08/15/2016)  . Basal cell carcinoma    forehead & nose; [facility] cut them off; deep  cutting  . Depression   . GERD (gastroesophageal reflux disease)   . Hepatitis    "think I had it as a child"  . History of kidney stones   . HOH (hard of hearing)   . Hypercholesterolemia   . Osteoporosis   . Pyelonephritis, acute     s/p ureteral stent  . Vasomotor rhinitis    chronic    Past Surgical History:  Procedure Laterality Date  . ABDOMINAL HYSTERECTOMY  1968   secondary to prolapse, ovaries not removed  . BASAL CELL CARCINOMA EXCISION     forehead & nose; [facility] cut them off; deep cutting  . CATARACT EXTRACTION, BILATERAL Bilateral   . COLONOSCOPY    . DILATION AND CURETTAGE OF UTERUS     S/P miscarriage  . LITHOTRIPSY     Dr Yves Dill, nephrolithiasis  . PARATHYROIDECTOMY Left 08/15/2016    inferior minimally invasive Archie Endo 08/15/2016  . PARATHYROIDECTOMY Left 08/15/2016   Procedure: LEFT INFERIOR PARATHYROIDECTOMY;  Surgeon: Armandina Gemma, MD;  Location: [facility];  Service: General;  Laterality: Left;  . RETINAL DETACHMENT SURGERY    . URETERAL STENT PLACEMENT      Family History  Problem Relation Age of Onset  . Skin cancer Father   . Colon cancer Brother   . Hypertension Sister   . Diabetes Mellitus II Sister   . Breast cancer Sister 44  . Breast cancer Other  niece    SOCIAL HX: reviewed.    Current Outpatient Medications:  .  ALPRAZolam (XANAX) 0.25 MG tablet, TAKE ONE TABLET BY MOUTH ONCE DAILY AS NEEDED, Disp: 30 tablet, Rfl: 0 .  azelastine (ASTELIN) 0.1 % nasal spray, Place 1 spray into both nostrils 2 (two) times daily. Use in each nostril as directed, Disp: 30 mL, Rfl: 2 .  Calcium Carbonate-Vitamin D (CALCIUM 600+D) 600-400 MG-UNIT per tablet, Take 1 tablet by mouth 2 (two) times daily., Disp: , Rfl:  .  Cholecalciferol (VITAMIN D-3) 1000 UNITS CAPS, Take 1,000 Units by mouth daily. , Disp: , Rfl:  .  CRANBERRY PO, Take 1 tablet by mouth daily., Disp: , Rfl:  .  fish oil-omega-3 fatty acids 1000 MG capsule, Take 1 g by mouth daily.,  Disp: , Rfl:  .  mupirocin ointment (BACTROBAN) 2 %, Apply to affected area bid, Disp: 22 g, Rfl: 0 .  OVER THE COUNTER MEDICATION, Place 1 drop into both eyes daily as needed (for dry eyes)., Disp: , Rfl:  .  pantoprazole (PROTONIX) 40 MG tablet, Take 1 tablet by mouth once daily, Disp: 90 tablet, Rfl: 3 .  sertraline (ZOLOFT) 25 MG tablet, Take 1 tablet (25 mg total) by mouth daily., Disp: 30 tablet, Rfl: 1 .  simvastatin (ZOCOR) 40 MG tablet, Take 1 tablet by mouth in the evening, Disp: 90 tablet, Rfl: 3  EXAM:  GENERAL: alert.  Sounds to be in no acute distress.  Answering questions appropriately.    PSYCH/NEURO: pleasant and cooperative, no obvious depression or anxiety, speech and thought processing grossly intact  ASSESSMENT AND PLAN:  Discussed the following assessment and plan:  Environmental allergies Appear to be under reasonable control.   GERD (gastroesophageal reflux disease) Controlled on current regimen.    Hypercholesterolemia On simvastatin.  Low cholesterol diet and exercise.  Follow lipid panel and liver function tests.    Left knee pain Increased pain in left knee.  States pain localized to the knee.  Discussed further evaluation.  Wants knee xray when comes in for next B12.  Tylenol as directed.  Follow.    Osteoporosis Discussed with her today.  Discussed reclast.  Wants to hold currently.  Calcium, vitamin D and weight bearing exercise.  Follow.    Primary hyperparathyroidism ([place]) S/p parathyroidectomy.  Saw Dr Harlow Asa.  Follow calcium.    Stress Increased stress as outlined.  Discussed with her today.  Does not feel needs counseling.  Discussed medication. Agreeable to zoloft.  Start 25mg  q day. Follow.    Vitamin D deficiency Follow vitamin D level.      I discussed the assessment and treatment plan with the patient. The patient was provided an opportunity to ask questions and all were answered. The patient agreed with the plan and demonstrated an  understanding of the instructions.   The patient was advised to call back or seek an in-person evaluation if the symptoms worsen or if the condition fails to improve as anticipated.  I provided 25 minutes of non-face-to-face time during this encounter.   Einar Pheasant, MD

## 2019-06-20 ENCOUNTER — Encounter: Payer: Self-pay | Admitting: Internal Medicine

## 2019-06-20 NOTE — Assessment & Plan Note (Signed)
On simvastatin.  Low cholesterol diet and exercise.  Follow lipid panel and liver function tests.   

## 2019-06-20 NOTE — Assessment & Plan Note (Signed)
Increased stress as outlined.  Discussed with her today.  Does not feel needs counseling.  Discussed medication. Agreeable to zoloft.  Start 25mg  q day. Follow.

## 2019-06-20 NOTE — Assessment & Plan Note (Signed)
Discussed with her today.  Discussed reclast.  Wants to hold currently.  Calcium, vitamin D and weight bearing exercise.  Follow.

## 2019-06-20 NOTE — Assessment & Plan Note (Signed)
Follow vitamin D level.  

## 2019-06-20 NOTE — Assessment & Plan Note (Signed)
Increased pain in left knee.  States pain localized to the knee.  Discussed further evaluation.  Wants knee xray when comes in for next B12.  Tylenol as directed.  Follow.

## 2019-06-20 NOTE — Assessment & Plan Note (Signed)
S/p parathyroidectomy.  Saw Dr Harlow Asa.  Follow calcium.

## 2019-06-20 NOTE — Assessment & Plan Note (Signed)
Appear to be under reasonable control.

## 2019-06-20 NOTE — Assessment & Plan Note (Signed)
Controlled on current regimen.   

## 2019-06-21 ENCOUNTER — Ambulatory Visit (INDEPENDENT_AMBULATORY_CARE_PROVIDER_SITE_OTHER): Payer: Medicare Other

## 2019-06-21 ENCOUNTER — Other Ambulatory Visit: Payer: Self-pay

## 2019-06-21 DIAGNOSIS — Z0000 Encounter for general adult medical examination without abnormal findings: Secondary | ICD-10-CM | POA: Diagnosis not present

## 2019-06-21 NOTE — Progress Notes (Signed)
Subjective:   Sabrina Cervantes is a 85 y.o. female who presents for Medicare Annual (Subsequent) preventive examination.  Review of Systems:  No ROS.  Medicare Wellness Virtual Visit.  Visual/audio telehealth visit, UTA vital signs.   See social history for additional risk factors.   Cardiac Risk Factors include: advanced age (>77men, >51 women)     Objective:     Vitals: There were no vitals taken for this visit.  There is no height or weight on file to calculate BMI.  Advanced Directives 06/21/2019 03/03/2018 01/28/2017 08/15/2016 08/08/2016  Does Patient Have a Medical Advance Directive? Yes Yes Yes Yes Yes  Type of Paramedic of [place];Living will Living will;Healthcare Power of [place];Living will Healthcare Power of [place];Living will  Does patient want to make changes to medical advance directive? No - Patient declined No - Patient declined No - Patient declined No - Patient declined No - Patient declined  Copy of [place] in Chart? Yes - validated most recent copy scanned in chart (See row information) No - copy requested No - copy requested Yes Yes    Tobacco Social History   Tobacco Use  Smoking Status Never Smoker  Smokeless Tobacco Never Used     Counseling given: Not Answered   Clinical Intake:  Pre-visit preparation completed: Yes        Diabetes: No  How often do you need to have someone help you when you read instructions, pamphlets, or other written materials from your doctor or pharmacy?: 1 - Never  Interpreter Needed?: No     Past Medical History:  Diagnosis Date  . Anemia   . Anxiety   . Arthritis    "left hip" (08/15/2016)  . Basal cell carcinoma    forehead & nose; Cancer Center cut them off; deep cutting  . Depression   . GERD (gastroesophageal reflux disease)   . Hepatitis    "think I had it as a child"  . History of kidney stones    . HOH (hard of hearing)   . Hypercholesterolemia   . Osteoporosis   . Pyelonephritis, acute     s/p ureteral stent  . Vasomotor rhinitis    chronic   Past Surgical History:  Procedure Laterality Date  . ABDOMINAL HYSTERECTOMY  1968   secondary to prolapse, ovaries not removed  . BASAL CELL CARCINOMA EXCISION     forehead & nose; Cancer Center cut them off; deep cutting  . CATARACT EXTRACTION, BILATERAL Bilateral   . COLONOSCOPY    . DILATION AND CURETTAGE OF UTERUS     S/P miscarriage  . LITHOTRIPSY     Dr Yves Dill, nephrolithiasis  . PARATHYROIDECTOMY Left 08/15/2016    inferior minimally invasive Archie Endo 08/15/2016  . PARATHYROIDECTOMY Left 08/15/2016   Procedure: LEFT INFERIOR PARATHYROIDECTOMY;  Surgeon: Armandina Gemma, MD;  Location: [facility];  Service: General;  Laterality: Left;  . RETINAL DETACHMENT SURGERY    . URETERAL STENT PLACEMENT     Family History  Problem Relation Age of Onset  . Skin cancer Father   . Colon cancer Brother   . Hypertension Sister   . Diabetes Mellitus II Sister   . Breast cancer Sister 67  . Breast cancer Other        niece   Social History   Socioeconomic History  . Marital status: Widowed    Spouse name: Not on file  . Number of children:  4  . Years of education: Not on file  . Highest education level: Not on file  Occupational History  . Not on file  Social Needs  . Financial resource strain: Not hard at all  . Food insecurity    Worry: Patient refused    Inability: Patient refused  . Transportation needs    Medical: No    Non-medical: No  Tobacco Use  . Smoking status: Never Smoker  . Smokeless tobacco: Never Used  Substance and Sexual Activity  . Alcohol use: No    Alcohol/week: 0.0 standard drinks  . Drug use: No  . Sexual activity: Never  Lifestyle  . Physical activity    Days per week: Not on file    Minutes per session: Not on file  . Stress: Not at all  Relationships  . Social Herbalist on phone: Not  on file    Gets together: Not on file    Attends religious service: Not on file    Active member of club or organization: Not on file    Attends meetings of clubs or organizations: Not on file    Relationship status: Not on file  Other Topics Concern  . Not on file  Social History Narrative  . Not on file    Outpatient Encounter Medications as of 06/21/2019  Medication Sig  . ALPRAZolam (XANAX) 0.25 MG tablet TAKE ONE TABLET BY MOUTH ONCE DAILY AS NEEDED  . azelastine (ASTELIN) 0.1 % nasal spray Place 1 spray into both nostrils 2 (two) times daily. Use in each nostril as directed  . Calcium Carbonate-Vitamin D (CALCIUM 600+D) 600-400 MG-UNIT per tablet Take 1 tablet by mouth 2 (two) times daily.  . Cholecalciferol (VITAMIN D-3) 1000 UNITS CAPS Take 1,000 Units by mouth daily.   Marland Kitchen CRANBERRY PO Take 1 tablet by mouth daily.  . fish oil-omega-3 fatty acids 1000 MG capsule Take 1 g by mouth daily.  . mupirocin ointment (BACTROBAN) 2 % Apply to affected area bid  . OVER THE COUNTER MEDICATION Place 1 drop into both eyes daily as needed (for dry eyes).  . pantoprazole (PROTONIX) 40 MG tablet Take 1 tablet by mouth once daily  . sertraline (ZOLOFT) 25 MG tablet Take 1 tablet (25 mg total) by mouth daily.  . simvastatin (ZOCOR) 40 MG tablet Take 1 tablet by mouth in the evening   No facility-administered encounter medications on file as of 06/21/2019.     Activities of Daily Living In your present state of health, do you have any difficulty performing the following activities: 06/21/2019  Hearing? N  Vision? N  Walking or climbing stairs? Y  Comment Unsteady gait  Dressing or bathing? N  Doing errands, shopping? Y  Comment She does not Physiological scientist and eating ? Y  Comment Son assists with meal prep. Self feeds  Using the Toilet? N  In the past six months, have you accidently leaked urine? Y  Comment Managed with daily pad  Do you have problems with loss of bowel control? N   Managing your Medications? N  Managing your Finances? N  Housekeeping or managing your Housekeeping? Y  Comment Son assists as needed  Some recent data might be hidden    Patient Care Team: Einar Pheasant, MD as PCP - General (Internal Medicine)    Assessment:   This is a routine wellness examination for [facility].  I connected with patient 06/21/19 at 10:00 AM EDT by an audio enabled  telemedicine application and verified that I am speaking with the correct person using two identifiers. Patient stated full name and DOB. Patient gave permission to continue with virtual visit. Patient's location was at home and Nurse's location was at [place] office.   Health Maintenance Due: Influenza vaccine 2020- discussed; to be completed in season with doctor or local pharmacy.   Tdap- discussed; to be completed with doctor in visit or local pharmacy. Update all pending maintenance due as appropriate.   See completed HM at the end of note.   Eye- Visual acuity not assessed. Virtual visit. Followed by their ophthalmologist every 12 months. Torn retina R eye. Wears glasses.   Dental- upper dentures  Hearing-difficulty hearing some conversational tones. No hearing aids worn at this time. Audiology deferred due to patient preference.   Safety  Patient feels safe at home- yes. Son resides with her.  Patient does have smoke detectors at home- yes Patient does wear sunscreen or protective clothing when in direct sunlight - yes Patient does wear seat belt when in a moving vehicle - yes Patient drives- no. Sister or son will drive her to appointments.  Adequate lighting in walkways free from debris- yes Grab bars and handrails used as appropriate- yes Ambulates with a walker.   Social  Alcohol intake - no    Smoking history- never   Smokers in home? none Illicit drug use? none  Covid-19 precautions and sickness symptoms discussed. Wears mask as appropriate. Son grocery shops for her.   Activities  of Daily Living Patient denies needing assistance with: feeding themselves, getting from bed to chair, getting to the toilet, bathing/showering, dressing, managing money. Son assists with household chores and meal prep as needed.   Medication- taking without issues.   Memory: Patient is alert. Patient denies difficulty focusing or concentrating. Correctly identified the president of the [place], season and recall. Patient likes to read her Bible for brain stimulation.  BMI- discussed the importance of a healthy diet, water intake and the benefits of aerobic exercise.  Educational material provided.  Physical activity- walking in the home. Standing exercises without bending over.   Diet: No red meat; healthy diet Water: good intake Caffeine: 1 soda daily Ensure/Protein supplement: none. Plans to add to diet. Give samples to patient at next visit.   Advanced Directive End of life planning; Advance aging; Advanced directives discussed.  Copy of current HCPOA/Living Will on file.    Other Providers Patient Care Team: Einar Pheasant, MD as PCP - General (Internal Medicine)  Exercise Activities and Dietary recommendations    Goals      Patient Stated   . DIET - INCREASE LEAN PROTEINS (pt-stated)     Add Ensure/Premier protein supplemental drink to diet       Fall Risk Fall Risk  06/21/2019 03/03/2018 01/28/2017 10/11/2016 08/24/2015  Falls in the past year? 0 Yes No No Yes  Comment - - - Emmi Telephone Survey: data to providers prior to load -  Number falls in past yr: - 1 - - 1  Injury with Fall? - No - - No  Comment - Slipped down between her dresser and bed.  No injury.  - - -  Follow up - Education provided;Falls prevention discussed - - -   Timed Get Up and Go performed: no, virtual visit  Depression Screen PHQ 2/9 Scores 06/21/2019 03/03/2018 01/28/2017 08/24/2015  PHQ - 2 Score 1 0 0 0     Cognitive Function MMSE - Mini Mental State  Exam 01/28/2017  Orientation to time  5  Orientation to Place 5  Registration 3  Attention/ Calculation 3  Recall 3  Language- name 2 objects 2  Language- repeat 1  Language- follow 3 step command 3  Language- read & follow direction 1  Write a sentence 1  Copy design 1  Total score 28     6CIT Screen 06/21/2019 03/03/2018  What Year? 0 points 0 points  What month? 0 points 0 points  What time? 0 points 0 points  Count back from 20 0 points 0 points  Months in reverse 0 points 0 points  Repeat phrase 2 points 0 points  Total Score 2 0    Immunization History  Administered Date(s) Administered  . Influenza Split 08/08/2012  . Influenza, High Dose Seasonal PF 08/25/2017, 08/25/2018  . Influenza,inj,Quad PF,6+ Mos 07/19/2013, 07/26/2014, 08/24/2015  . Influenza-Unspecified 08/08/2012, 07/19/2013, 07/26/2014, 08/08/2016  . Pneumococcal Conjugate-13 01/18/2014  . Pneumococcal Polysaccharide-23 02/24/2017    Screening Tests Health Maintenance  Topic Date Due  . TETANUS/TDAP  11/06/1947  . INFLUENZA VACCINE  09/24/2019 (Originally 06/05/2019)  . MAMMOGRAM  10/31/2019  . DEXA SCAN  Completed  . PNA vac Low Risk Adult  Completed       Plan:    Keep all routine maintenance appointments.  Next scheduled lab, xray, B12 injection -07/07/19  Follow up -10/2013  Medicare Attestation I have personally reviewed: The patient's medical and social history Their use of alcohol, tobacco or illicit drugs Their current medications and supplements The patient's functional ability including ADLs,fall risks, home safety risks, cognitive, and hearing and visual impairment Diet and physical activities Evidence for depression   In addition, I have reviewed and discussed with patient certain preventive protocols, quality metrics, and best practice recommendations. A written personalized care plan for preventive services as well as general preventive health recommendations were provided to patient.     Varney Biles,  LPN  [id number]   Reviewed above information.  Agree with assessment and plan.    Dr Nicki Reaper

## 2019-06-21 NOTE — Patient Instructions (Addendum)
  Ms. Sivils , Thank you for taking time to come for your Medicare Wellness Visit. I appreciate your ongoing commitment to your health goals. Please review the following plan we discussed and let me know if I can assist you in the future.   These are the goals we discussed: Goals      Patient Stated   . DIET - INCREASE LEAN PROTEINS (pt-stated)     Add Ensure/Premier protein supplemental drink to diet       This is a list of the screening recommended for you and due dates:  Health Maintenance  Topic Date Due  . Tetanus Vaccine  11/06/1947  . Flu Shot  09/24/2019*  . Mammogram  10/31/2019  . DEXA scan (bone density measurement)  Completed  . Pneumonia vaccines  Completed  *Topic was postponed. The date shown is not the original due date.

## 2019-07-06 ENCOUNTER — Ambulatory Visit: Payer: Medicare Other

## 2019-07-07 ENCOUNTER — Other Ambulatory Visit: Payer: Self-pay | Admitting: Internal Medicine

## 2019-07-07 ENCOUNTER — Other Ambulatory Visit: Payer: Self-pay

## 2019-07-07 ENCOUNTER — Other Ambulatory Visit (INDEPENDENT_AMBULATORY_CARE_PROVIDER_SITE_OTHER): Payer: Medicare Other

## 2019-07-07 ENCOUNTER — Ambulatory Visit (INDEPENDENT_AMBULATORY_CARE_PROVIDER_SITE_OTHER): Payer: Medicare Other

## 2019-07-07 ENCOUNTER — Ambulatory Visit (INDEPENDENT_AMBULATORY_CARE_PROVIDER_SITE_OTHER): Payer: Medicare Other | Admitting: *Deleted

## 2019-07-07 DIAGNOSIS — E7800 Pure hypercholesterolemia, unspecified: Secondary | ICD-10-CM | POA: Diagnosis not present

## 2019-07-07 DIAGNOSIS — D649 Anemia, unspecified: Secondary | ICD-10-CM

## 2019-07-07 DIAGNOSIS — Z23 Encounter for immunization: Secondary | ICD-10-CM

## 2019-07-07 DIAGNOSIS — G8929 Other chronic pain: Secondary | ICD-10-CM | POA: Diagnosis not present

## 2019-07-07 DIAGNOSIS — E538 Deficiency of other specified B group vitamins: Secondary | ICD-10-CM | POA: Diagnosis not present

## 2019-07-07 DIAGNOSIS — M25562 Pain in left knee: Secondary | ICD-10-CM

## 2019-07-07 DIAGNOSIS — M1712 Unilateral primary osteoarthritis, left knee: Secondary | ICD-10-CM | POA: Diagnosis not present

## 2019-07-07 LAB — TSH: TSH: 4.28 u[IU]/mL (ref 0.35–4.50)

## 2019-07-07 LAB — HEPATIC FUNCTION PANEL
ALT: 10 U/L (ref 0–35)
AST: 13 U/L (ref 0–37)
Albumin: 4.1 g/dL (ref 3.5–5.2)
Alkaline Phosphatase: 54 U/L (ref 39–117)
Bilirubin, Direct: 0.2 mg/dL (ref 0.0–0.3)
Total Bilirubin: 1 mg/dL (ref 0.2–1.2)
Total Protein: 6.1 g/dL (ref 6.0–8.3)

## 2019-07-07 LAB — CBC WITH DIFFERENTIAL/PLATELET
Basophils Absolute: 0 10*3/uL (ref 0.0–0.1)
Basophils Relative: 0.7 % (ref 0.0–3.0)
Eosinophils Absolute: 0.1 10*3/uL (ref 0.0–0.7)
Eosinophils Relative: 2.2 % (ref 0.0–5.0)
HCT: 33.5 % — ABNORMAL LOW (ref 36.0–46.0)
Hemoglobin: 11.5 g/dL — ABNORMAL LOW (ref 12.0–15.0)
Lymphocytes Relative: 38.8 % (ref 12.0–46.0)
Lymphs Abs: 2.5 10*3/uL (ref 0.7–4.0)
MCHC: 34.2 g/dL (ref 30.0–36.0)
MCV: 88.6 fl (ref 78.0–100.0)
Monocytes Absolute: 0.4 10*3/uL (ref 0.1–1.0)
Monocytes Relative: 7 % (ref 3.0–12.0)
Neutro Abs: 3.3 10*3/uL (ref 1.4–7.7)
Neutrophils Relative %: 51.3 % (ref 43.0–77.0)
Platelets: 264 10*3/uL (ref 150.0–400.0)
RBC: 3.78 Mil/uL — ABNORMAL LOW (ref 3.87–5.11)
RDW: 14.5 % (ref 11.5–15.5)
WBC: 6.3 10*3/uL (ref 4.0–10.5)

## 2019-07-07 LAB — LIPID PANEL
Cholesterol: 164 mg/dL (ref 0–200)
HDL: 51 mg/dL (ref >39.00)
LDL Cholesterol: 90 mg/dL (ref 0–99)
NonHDL: 113.48
Total CHOL/HDL Ratio: 3
Triglycerides: 118 mg/dL (ref 0.0–149.0)
VLDL: 23.6 mg/dL (ref 0.0–40.0)

## 2019-07-07 LAB — BASIC METABOLIC PANEL
BUN: 10 mg/dL (ref 6–23)
CO2: 30 mEq/L (ref 19–32)
Calcium: 9.7 mg/dL (ref 8.4–10.5)
Chloride: 105 mEq/L (ref 96–112)
Creatinine, Ser: 0.96 mg/dL (ref 0.40–1.20)
GFR: 54.77 mL/min — ABNORMAL LOW (ref >60.00)
Glucose, Bld: 98 mg/dL (ref 70–99)
Potassium: 3.9 mEq/L (ref 3.5–5.1)
Sodium: 142 mEq/L (ref 135–145)

## 2019-07-07 MED ORDER — CYANOCOBALAMIN 1000 MCG/ML IJ SOLN
1000.0000 ug | Freq: Once | INTRAMUSCULAR | Status: AC
  Administered 2019-07-07: 11:00:00 1000 ug via INTRAMUSCULAR

## 2019-07-07 NOTE — Progress Notes (Addendum)
Patient presented for B 12 injection to right deltoid, patient voiced no concerns nor showed any signs of distress during injection.  Reviewed.  Dr Scott 

## 2019-07-07 NOTE — Progress Notes (Signed)
Orders placed for f/u labs.  

## 2019-07-08 ENCOUNTER — Other Ambulatory Visit: Payer: Medicare Other

## 2019-07-15 ENCOUNTER — Telehealth: Payer: Self-pay

## 2019-07-15 NOTE — Telephone Encounter (Signed)
Copied from [place] [id number]. Topic: General - Other >> Jul 15, 2019  1:05 PM Sheran Luz wrote: Patient requesting a call back from [place] to go over patients lab results from 9/2.

## 2019-07-21 NOTE — Telephone Encounter (Signed)
Pt calling with update to let me know that her knee is getting better.,

## 2019-08-09 ENCOUNTER — Other Ambulatory Visit: Payer: Self-pay

## 2019-08-09 ENCOUNTER — Ambulatory Visit (INDEPENDENT_AMBULATORY_CARE_PROVIDER_SITE_OTHER): Payer: Medicare Other

## 2019-08-09 ENCOUNTER — Other Ambulatory Visit (INDEPENDENT_AMBULATORY_CARE_PROVIDER_SITE_OTHER): Payer: Medicare Other

## 2019-08-09 DIAGNOSIS — E538 Deficiency of other specified B group vitamins: Secondary | ICD-10-CM

## 2019-08-09 DIAGNOSIS — D649 Anemia, unspecified: Secondary | ICD-10-CM | POA: Diagnosis not present

## 2019-08-09 MED ORDER — CYANOCOBALAMIN 1000 MCG/ML IJ SOLN
1000.0000 ug | Freq: Once | INTRAMUSCULAR | Status: AC
  Administered 2019-08-09: 1000 ug via INTRAMUSCULAR

## 2019-08-09 NOTE — Progress Notes (Addendum)
Patient came in the clinic for a b-12 injection in the left arm. Patient tolerated injection very well. She had no questions, comments, and or concerns that this time  Reviewed.  Dr Nicki Reaper

## 2019-08-10 LAB — IBC + FERRITIN
Ferritin: 20.5 ng/mL (ref 10.0–291.0)
Iron: 79 ug/dL (ref 42–145)
Saturation Ratios: 27 % (ref 20.0–50.0)
Transferrin: 209 mg/dL — ABNORMAL LOW (ref 212.0–360.0)

## 2019-08-10 LAB — CBC WITH DIFFERENTIAL/PLATELET
Basophils Absolute: 0.1 10*3/uL (ref 0.0–0.1)
Basophils Relative: 0.9 % (ref 0.0–3.0)
Eosinophils Absolute: 0.1 10*3/uL (ref 0.0–0.7)
Eosinophils Relative: 1.5 % (ref 0.0–5.0)
HCT: 34.1 % — ABNORMAL LOW (ref 36.0–46.0)
Hemoglobin: 11.4 g/dL — ABNORMAL LOW (ref 12.0–15.0)
Lymphocytes Relative: 31 % (ref 12.0–46.0)
Lymphs Abs: 2.2 10*3/uL (ref 0.7–4.0)
MCHC: 33.3 g/dL (ref 30.0–36.0)
MCV: 88.6 fl (ref 78.0–100.0)
Monocytes Absolute: 0.5 10*3/uL (ref 0.1–1.0)
Monocytes Relative: 6.9 % (ref 3.0–12.0)
Neutro Abs: 4.2 10*3/uL (ref 1.4–7.7)
Neutrophils Relative %: 59.7 % (ref 43.0–77.0)
Platelets: 262 10*3/uL (ref 150.0–400.0)
RBC: 3.85 Mil/uL — ABNORMAL LOW (ref 3.87–5.11)
RDW: 14.7 % (ref 11.5–15.5)
WBC: 7.1 10*3/uL (ref 4.0–10.5)

## 2019-08-10 LAB — VITAMIN B12: Vitamin B-12: 545 pg/mL (ref 211–911)

## 2019-08-11 ENCOUNTER — Other Ambulatory Visit: Payer: Self-pay | Admitting: Internal Medicine

## 2019-08-11 DIAGNOSIS — D649 Anemia, unspecified: Secondary | ICD-10-CM

## 2019-08-11 NOTE — Progress Notes (Signed)
Orders placed for f/u labs.  

## 2019-09-06 ENCOUNTER — Telehealth: Payer: Self-pay | Admitting: Internal Medicine

## 2019-09-06 NOTE — Telephone Encounter (Signed)
Patients sister, Ms. Bedelia Person. Zenia Resides,  is calling to request a list of the patients vitamins to take.  Please advise CB- [phone]

## 2019-09-13 ENCOUNTER — Other Ambulatory Visit: Payer: Self-pay

## 2019-09-13 ENCOUNTER — Ambulatory Visit (INDEPENDENT_AMBULATORY_CARE_PROVIDER_SITE_OTHER): Payer: Medicare Other

## 2019-09-13 DIAGNOSIS — E538 Deficiency of other specified B group vitamins: Secondary | ICD-10-CM

## 2019-09-13 MED ORDER — CYANOCOBALAMIN 1000 MCG/ML IJ SOLN
1000.0000 ug | Freq: Once | INTRAMUSCULAR | Status: AC
  Administered 2019-09-13: 1000 ug via INTRAMUSCULAR

## 2019-09-13 NOTE — Telephone Encounter (Signed)
Patient should be taking Fish Oil, Calcium with Vitamin D and Vitamin D3. Advised at [place].

## 2019-09-13 NOTE — Progress Notes (Addendum)
Patient presented today for B12 injection.  Administered IM in the right deltoid.  Patient tolerated well with no signs of distress.  Reviewed.  Dr Scott  

## 2019-09-15 ENCOUNTER — Other Ambulatory Visit: Payer: Self-pay | Admitting: Internal Medicine

## 2019-09-15 DIAGNOSIS — Z1231 Encounter for screening mammogram for malignant neoplasm of breast: Secondary | ICD-10-CM

## 2019-09-29 ENCOUNTER — Other Ambulatory Visit: Payer: Self-pay

## 2019-10-11 ENCOUNTER — Other Ambulatory Visit: Payer: Medicare Other

## 2019-10-14 ENCOUNTER — Ambulatory Visit (INDEPENDENT_AMBULATORY_CARE_PROVIDER_SITE_OTHER): Payer: Medicare Other

## 2019-10-14 ENCOUNTER — Other Ambulatory Visit: Payer: Self-pay

## 2019-10-14 ENCOUNTER — Other Ambulatory Visit (INDEPENDENT_AMBULATORY_CARE_PROVIDER_SITE_OTHER): Payer: Medicare Other

## 2019-10-14 DIAGNOSIS — E538 Deficiency of other specified B group vitamins: Secondary | ICD-10-CM

## 2019-10-14 DIAGNOSIS — D649 Anemia, unspecified: Secondary | ICD-10-CM | POA: Diagnosis not present

## 2019-10-14 LAB — IBC + FERRITIN
Ferritin: 26 ng/mL (ref 10.0–291.0)
Iron: 62 ug/dL (ref 42–145)
Saturation Ratios: 20.7 % (ref 20.0–50.0)
Transferrin: 214 mg/dL (ref 212.0–360.0)

## 2019-10-14 LAB — CBC WITH DIFFERENTIAL/PLATELET
Basophils Absolute: 0.1 10*3/uL (ref 0.0–0.1)
Basophils Relative: 1.1 % (ref 0.0–3.0)
Eosinophils Absolute: 0.1 10*3/uL (ref 0.0–0.7)
Eosinophils Relative: 2 % (ref 0.0–5.0)
HCT: 34.7 % — ABNORMAL LOW (ref 36.0–46.0)
Hemoglobin: 11.5 g/dL — ABNORMAL LOW (ref 12.0–15.0)
Lymphocytes Relative: 30.9 % (ref 12.0–46.0)
Lymphs Abs: 2 10*3/uL (ref 0.7–4.0)
MCHC: 33.1 g/dL (ref 30.0–36.0)
MCV: 88.6 fl (ref 78.0–100.0)
Monocytes Absolute: 0.4 10*3/uL (ref 0.1–1.0)
Monocytes Relative: 6.8 % (ref 3.0–12.0)
Neutro Abs: 3.9 10*3/uL (ref 1.4–7.7)
Neutrophils Relative %: 59.2 % (ref 43.0–77.0)
Platelets: 277 10*3/uL (ref 150.0–400.0)
RBC: 3.92 Mil/uL (ref 3.87–5.11)
RDW: 14.7 % (ref 11.5–15.5)
WBC: 6.6 10*3/uL (ref 4.0–10.5)

## 2019-10-14 MED ORDER — CYANOCOBALAMIN 1000 MCG/ML IJ SOLN
1000.0000 ug | Freq: Once | INTRAMUSCULAR | Status: AC
  Administered 2019-10-14: 1000 ug via INTRAMUSCULAR

## 2019-10-14 NOTE — Progress Notes (Addendum)
Patient came in the clinic for a b-12 injection in the left arm. Patient tolerated injection very well. She had no questions, comments, and or concerns that this time  Reviewed.   Dr Nicki Reaper

## 2019-10-18 ENCOUNTER — Ambulatory Visit (INDEPENDENT_AMBULATORY_CARE_PROVIDER_SITE_OTHER): Payer: Medicare Other | Admitting: Internal Medicine

## 2019-10-18 ENCOUNTER — Encounter: Payer: Self-pay | Admitting: Internal Medicine

## 2019-10-18 ENCOUNTER — Other Ambulatory Visit: Payer: Self-pay

## 2019-10-18 DIAGNOSIS — D649 Anemia, unspecified: Secondary | ICD-10-CM

## 2019-10-18 DIAGNOSIS — K219 Gastro-esophageal reflux disease without esophagitis: Secondary | ICD-10-CM | POA: Diagnosis not present

## 2019-10-18 DIAGNOSIS — E7800 Pure hypercholesterolemia, unspecified: Secondary | ICD-10-CM | POA: Diagnosis not present

## 2019-10-18 DIAGNOSIS — E210 Primary hyperparathyroidism: Secondary | ICD-10-CM

## 2019-10-18 DIAGNOSIS — Z9109 Other allergy status, other than to drugs and biological substances: Secondary | ICD-10-CM

## 2019-10-18 DIAGNOSIS — E559 Vitamin D deficiency, unspecified: Secondary | ICD-10-CM

## 2019-10-18 DIAGNOSIS — F439 Reaction to severe stress, unspecified: Secondary | ICD-10-CM

## 2019-10-18 DIAGNOSIS — M25562 Pain in left knee: Secondary | ICD-10-CM

## 2019-10-18 DIAGNOSIS — G8929 Other chronic pain: Secondary | ICD-10-CM

## 2019-10-18 NOTE — Progress Notes (Signed)
Patient ID: Sabrina Cervantes, female   DOB: [birth year], 85 y.o.   MRN: [id number]   Virtual Visit via telephone Note  This visit type was conducted due to national recommendations for restrictions regarding the COVID-19 pandemic (e.g. social distancing).  This format is felt to be most appropriate for this patient at this time.  All issues noted in this document were discussed and addressed.  No physical exam was performed (except for noted visual exam findings with Video Visits).   I connected with Jannifer Hick by telephone and verified that I am speaking with the correct person using two identifiers. Location patient: home Location provider: work Persons participating in the telephone visit: patient, provider  I discussed the limitations, risks, security and privacy concerns of performing an evaluation and management service by telephone and the availability of in person appointments. The patient expressed understanding and agreed to proceed.   Reason for visit: scheduled follow up  HPI: She reports she is doing better.  Feels better.  Stress is better.  Handling stress. No longer taking zoloft.  Does not feel like she needs.  Stays active.  No chest pain.  Breathing stable.  Reports persistent runny nose, but no cough, congestion or sob.  Has tried antihistamines, flonase and astelin.  Does not help.  Discussed trial of atrovent nasal spray.  She declines.  Feels overall she can manage and desires no further intervention or treatment.  No acid reflux.  No abdominal pain.  Bowels stable.  Knee is doing better.  Overall feels better.  Has a new cat.  States this has really helped with her stress.     ROS: See pertinent positives and negatives per HPI.  Past Medical History:  Diagnosis Date  . Anemia   . Anxiety   . Arthritis    "left hip" (08/15/2016)  . Basal cell carcinoma    forehead & nose; Cancer Center cut them off; deep cutting  . Depression   . GERD (gastroesophageal reflux disease)   .  Hepatitis    "think I had it as a child"  . History of kidney stones   . HOH (hard of hearing)   . Hypercholesterolemia   . Osteoporosis   . Pyelonephritis, acute     s/p ureteral stent  . Vasomotor rhinitis    chronic    Past Surgical History:  Procedure Laterality Date  . ABDOMINAL HYSTERECTOMY  1968   secondary to prolapse, ovaries not removed  . BASAL CELL CARCINOMA EXCISION     forehead & nose; Cancer Center cut them off; deep cutting  . CATARACT EXTRACTION, BILATERAL Bilateral   . COLONOSCOPY    . DILATION AND CURETTAGE OF UTERUS     S/P miscarriage  . LITHOTRIPSY     Dr Yves Dill, nephrolithiasis  . PARATHYROIDECTOMY Left 08/15/2016    inferior minimally invasive Archie Endo 08/15/2016  . PARATHYROIDECTOMY Left 08/15/2016   Procedure: LEFT INFERIOR PARATHYROIDECTOMY;  Surgeon: Armandina Gemma, MD;  Location: [facility];  Service: General;  Laterality: Left;  . RETINAL DETACHMENT SURGERY    . URETERAL STENT PLACEMENT      Family History  Problem Relation Age of Onset  . Skin cancer Father   . Colon cancer Brother   . Hypertension Sister   . Diabetes Mellitus II Sister   . Breast cancer Sister 31  . Breast cancer Other        niece    SOCIAL HX: reviewed.    Current Outpatient Medications:  .  ALPRAZolam (XANAX) 0.25 MG tablet, TAKE ONE TABLET BY MOUTH ONCE DAILY AS NEEDED, Disp: 30 tablet, Rfl: 0 .  azelastine (ASTELIN) 0.1 % nasal spray, Place 1 spray into both nostrils 2 (two) times daily. Use in each nostril as directed, Disp: 30 mL, Rfl: 2 .  Calcium Carbonate-Vitamin D (CALCIUM 600+D) 600-400 MG-UNIT per tablet, Take 1 tablet by mouth 2 (two) times daily., Disp: , Rfl:  .  Cholecalciferol (VITAMIN D-3) 1000 UNITS CAPS, Take 1,000 Units by mouth daily. , Disp: , Rfl:  .  CRANBERRY PO, Take 1 tablet by mouth daily., Disp: , Rfl:  .  fish oil-omega-3 fatty acids 1000 MG capsule, Take 1 g by mouth daily., Disp: , Rfl:  .  mupirocin ointment (BACTROBAN) 2 %, Apply to affected  area bid, Disp: 22 g, Rfl: 0 .  OVER THE COUNTER MEDICATION, Place 1 drop into both eyes daily as needed (for dry eyes)., Disp: , Rfl:  .  pantoprazole (PROTONIX) 40 MG tablet, Take 1 tablet by mouth once daily, Disp: 90 tablet, Rfl: 3 .  sertraline (ZOLOFT) 25 MG tablet, Take 1 tablet (25 mg total) by mouth daily., Disp: 30 tablet, Rfl: 1 .  simvastatin (ZOCOR) 40 MG tablet, Take 1 tablet by mouth in the evening, Disp: 90 tablet, Rfl: 3  EXAM:  GENERAL: alert.  Sounds to be in no acute distress.  Answering questions appropriately.    PSYCH/NEURO: pleasant and cooperative, no obvious depression or anxiety, speech and thought processing grossly intact  ASSESSMENT AND PLAN:  Discussed the following assessment and plan:  Environmental allergies She desires no further intervention.  Feels stable.  Follow.    GERD (gastroesophageal reflux disease) Controlled on current regimen.  Follow.    Hypercholesterolemia On simvastatin.  Follow lipid panel and liver function tests.    Primary hyperparathyroidism ([place]) S/p parathyroidectomy.  Saw Dr Harlow Asa.  Follow calcium.    Stress Off zoloft.  Has a cat now.  Feels this has helped her significantly.  Doing better.  Does not feel needs anything more at this time.  Follow.    Vitamin D deficiency Follow vitamin D level.    Left knee pain Left knee pain better.  Follow.    Anemia Has been receiving B12 injections.  Follow cbc.  Check ferritin.      I discussed the assessment and treatment plan with the patient. The patient was provided an opportunity to ask questions and all were answered. The patient agreed with the plan and demonstrated an understanding of the instructions.   The patient was advised to call back or seek an in-person evaluation if the symptoms worsen or if the condition fails to improve as anticipated.  I provided 26 minutes of non-face-to-face time during this encounter.   Einar Pheasant, MD

## 2019-10-23 ENCOUNTER — Encounter: Payer: Self-pay | Admitting: Internal Medicine

## 2019-10-23 DIAGNOSIS — D649 Anemia, unspecified: Secondary | ICD-10-CM | POA: Insufficient documentation

## 2019-10-23 DIAGNOSIS — D509 Iron deficiency anemia, unspecified: Secondary | ICD-10-CM | POA: Insufficient documentation

## 2019-10-23 NOTE — Assessment & Plan Note (Signed)
Controlled on current regimen.  Follow.  

## 2019-10-23 NOTE — Assessment & Plan Note (Signed)
Left knee pain better.  Follow.

## 2019-10-23 NOTE — Assessment & Plan Note (Signed)
Has been receiving B12 injections.  Follow cbc.  Check ferritin.

## 2019-10-23 NOTE — Assessment & Plan Note (Signed)
Follow vitamin D level.  

## 2019-10-23 NOTE — Assessment & Plan Note (Signed)
S/p parathyroidectomy.  Saw Dr Harlow Asa.  Follow calcium.

## 2019-10-23 NOTE — Assessment & Plan Note (Signed)
Off zoloft.  Has a cat now.  Feels this has helped her significantly.  Doing better.  Does not feel needs anything more at this time.  Follow.

## 2019-10-23 NOTE — Assessment & Plan Note (Signed)
She desires no further intervention.  Feels stable.  Follow.

## 2019-10-23 NOTE — Assessment & Plan Note (Signed)
On simvastatin.  Follow lipid panel and liver function tests.   

## 2019-11-01 ENCOUNTER — Ambulatory Visit
Admission: RE | Admit: 2019-11-01 | Discharge: 2019-11-01 | Disposition: A | Discharge status: Home / Self Care | Payer: Medicare Other | Source: Ambulatory Visit | Attending: Internal Medicine | Admitting: Internal Medicine

## 2019-11-01 DIAGNOSIS — Z1231 Encounter for screening mammogram for malignant neoplasm of breast: Secondary | ICD-10-CM | POA: Diagnosis present

## 2019-11-11 ENCOUNTER — Other Ambulatory Visit: Payer: Self-pay

## 2019-11-16 ENCOUNTER — Other Ambulatory Visit: Payer: Self-pay

## 2019-11-16 ENCOUNTER — Ambulatory Visit (INDEPENDENT_AMBULATORY_CARE_PROVIDER_SITE_OTHER): Payer: Medicare Other

## 2019-11-16 VITALS — Temp 96.9°F

## 2019-11-16 DIAGNOSIS — E538 Deficiency of other specified B group vitamins: Secondary | ICD-10-CM | POA: Diagnosis not present

## 2019-11-16 MED ORDER — CYANOCOBALAMIN 1000 MCG/ML IJ SOLN
1000.0000 ug | Freq: Once | INTRAMUSCULAR | Status: AC
  Administered 2019-11-16: 1000 ug via INTRAMUSCULAR

## 2019-11-16 NOTE — Progress Notes (Addendum)
Patient came in today for B-12 injection requested by patient in left deltoid, IM. Patient tolerated well.   Reviewed.  Dr Nicki Reaper

## 2019-12-21 ENCOUNTER — Other Ambulatory Visit: Payer: Self-pay

## 2019-12-21 ENCOUNTER — Ambulatory Visit (INDEPENDENT_AMBULATORY_CARE_PROVIDER_SITE_OTHER): Payer: Medicare Other | Admitting: Lab

## 2019-12-21 DIAGNOSIS — E538 Deficiency of other specified B group vitamins: Secondary | ICD-10-CM

## 2019-12-21 MED ORDER — CYANOCOBALAMIN 1000 MCG/ML IJ SOLN
1000.0000 ug | Freq: Once | INTRAMUSCULAR | Status: AC
  Administered 2019-12-21: 1000 ug via INTRAMUSCULAR

## 2019-12-21 NOTE — Progress Notes (Addendum)
Pt in office today for Vit B-12 injection in R-Deltoid. Pt tolerated well.  Reviewed.  Dr Nicki Reaper

## 2020-01-06 DIAGNOSIS — H02059 Trichiasis without entropian unspecified eye, unspecified eyelid: Secondary | ICD-10-CM | POA: Diagnosis not present

## 2020-01-06 DIAGNOSIS — H353132 Nonexudative age-related macular degeneration, bilateral, intermediate dry stage: Secondary | ICD-10-CM | POA: Diagnosis not present

## 2020-01-25 ENCOUNTER — Other Ambulatory Visit: Payer: Self-pay

## 2020-01-25 ENCOUNTER — Ambulatory Visit (INDEPENDENT_AMBULATORY_CARE_PROVIDER_SITE_OTHER): Payer: Medicare Other | Admitting: *Deleted

## 2020-01-25 ENCOUNTER — Other Ambulatory Visit (INDEPENDENT_AMBULATORY_CARE_PROVIDER_SITE_OTHER): Payer: Medicare Other

## 2020-01-25 ENCOUNTER — Other Ambulatory Visit: Payer: Medicare Other

## 2020-01-25 DIAGNOSIS — E7800 Pure hypercholesterolemia, unspecified: Secondary | ICD-10-CM

## 2020-01-25 DIAGNOSIS — D649 Anemia, unspecified: Secondary | ICD-10-CM | POA: Diagnosis not present

## 2020-01-25 DIAGNOSIS — E538 Deficiency of other specified B group vitamins: Secondary | ICD-10-CM

## 2020-01-25 LAB — HEPATIC FUNCTION PANEL
ALT: 9 U/L (ref 0–35)
AST: 12 U/L (ref 0–37)
Albumin: 4.2 g/dL (ref 3.5–5.2)
Alkaline Phosphatase: 63 U/L (ref 39–117)
Bilirubin, Direct: 0.2 mg/dL (ref 0.0–0.3)
Total Bilirubin: 0.9 mg/dL (ref 0.2–1.2)
Total Protein: 6.3 g/dL (ref 6.0–8.3)

## 2020-01-25 LAB — CBC WITH DIFFERENTIAL/PLATELET
Basophils Absolute: 0 10*3/uL (ref 0.0–0.1)
Basophils Relative: 0.7 % (ref 0.0–3.0)
Eosinophils Absolute: 0.1 10*3/uL (ref 0.0–0.7)
Eosinophils Relative: 1.9 % (ref 0.0–5.0)
HCT: 33.1 % — ABNORMAL LOW (ref 36.0–46.0)
Hemoglobin: 11.3 g/dL — ABNORMAL LOW (ref 12.0–15.0)
Lymphocytes Relative: 27.4 % (ref 12.0–46.0)
Lymphs Abs: 1.8 10*3/uL (ref 0.7–4.0)
MCHC: 34 g/dL (ref 30.0–36.0)
MCV: 88.4 fl (ref 78.0–100.0)
Monocytes Absolute: 0.5 10*3/uL (ref 0.1–1.0)
Monocytes Relative: 8.2 % (ref 3.0–12.0)
Neutro Abs: 4.1 10*3/uL (ref 1.4–7.7)
Neutrophils Relative %: 61.8 % (ref 43.0–77.0)
Platelets: 250 10*3/uL (ref 150.0–400.0)
RBC: 3.75 Mil/uL — ABNORMAL LOW (ref 3.87–5.11)
RDW: 14.9 % (ref 11.5–15.5)
WBC: 6.7 10*3/uL (ref 4.0–10.5)

## 2020-01-25 LAB — LIPID PANEL
Cholesterol: 139 mg/dL (ref 0–200)
HDL: 50.4 mg/dL (ref >39.00)
LDL Cholesterol: 64 mg/dL (ref 0–99)
NonHDL: 88.71
Total CHOL/HDL Ratio: 3
Triglycerides: 125 mg/dL (ref 0.0–149.0)
VLDL: 25 mg/dL (ref 0.0–40.0)

## 2020-01-25 LAB — BASIC METABOLIC PANEL
BUN: 15 mg/dL (ref 6–23)
CO2: 29 mEq/L (ref 19–32)
Calcium: 9.7 mg/dL (ref 8.4–10.5)
Chloride: 106 mEq/L (ref 96–112)
Creatinine, Ser: 0.92 mg/dL (ref 0.40–1.20)
GFR: 57.45 mL/min — ABNORMAL LOW (ref >60.00)
Glucose, Bld: 99 mg/dL (ref 70–99)
Potassium: 3.4 mEq/L — ABNORMAL LOW (ref 3.5–5.1)
Sodium: 140 mEq/L (ref 135–145)

## 2020-01-25 LAB — FERRITIN: Ferritin: 38.6 ng/mL (ref 10.0–291.0)

## 2020-01-26 MED ORDER — CYANOCOBALAMIN 1000 MCG/ML IJ SOLN
1000.0000 ug | Freq: Once | INTRAMUSCULAR | Status: AC
  Administered 2020-01-25: 1000 ug via INTRAMUSCULAR

## 2020-01-26 NOTE — Progress Notes (Addendum)
Patient presented for B 12 injection to left deltoid, patient voiced no concerns nor showed any signs of distress during injection.  Reviewed.  Dr Scott 

## 2020-01-27 ENCOUNTER — Other Ambulatory Visit: Payer: Self-pay

## 2020-01-27 ENCOUNTER — Ambulatory Visit (INDEPENDENT_AMBULATORY_CARE_PROVIDER_SITE_OTHER): Payer: Medicare Other | Admitting: Internal Medicine

## 2020-01-27 ENCOUNTER — Encounter: Payer: Self-pay | Admitting: Internal Medicine

## 2020-01-27 VITALS — Ht 63.0 in | Wt 138.0 lb

## 2020-01-27 DIAGNOSIS — E7800 Pure hypercholesterolemia, unspecified: Secondary | ICD-10-CM | POA: Diagnosis not present

## 2020-01-27 DIAGNOSIS — E210 Primary hyperparathyroidism: Secondary | ICD-10-CM | POA: Diagnosis not present

## 2020-01-27 DIAGNOSIS — F439 Reaction to severe stress, unspecified: Secondary | ICD-10-CM | POA: Diagnosis not present

## 2020-01-27 DIAGNOSIS — K219 Gastro-esophageal reflux disease without esophagitis: Secondary | ICD-10-CM

## 2020-01-27 DIAGNOSIS — Z9109 Other allergy status, other than to drugs and biological substances: Secondary | ICD-10-CM | POA: Diagnosis not present

## 2020-01-27 DIAGNOSIS — E559 Vitamin D deficiency, unspecified: Secondary | ICD-10-CM | POA: Diagnosis not present

## 2020-01-27 DIAGNOSIS — M25562 Pain in left knee: Secondary | ICD-10-CM

## 2020-01-27 DIAGNOSIS — D649 Anemia, unspecified: Secondary | ICD-10-CM | POA: Diagnosis not present

## 2020-01-27 DIAGNOSIS — E876 Hypokalemia: Secondary | ICD-10-CM | POA: Diagnosis not present

## 2020-01-27 DIAGNOSIS — G8929 Other chronic pain: Secondary | ICD-10-CM | POA: Diagnosis not present

## 2020-01-27 MED ORDER — IPRATROPIUM BROMIDE 0.03 % NA SOLN: 1.0000 | Freq: Two times a day (BID) | NASAL | 1 refills | Status: DC

## 2020-01-27 NOTE — Progress Notes (Signed)
Patient ID: Sabrina Cervantes, female   DOB: 1929-01-09, 86 y.o.   MRN: [id number]   Virtual Visit via telephone Note  This visit type was conducted due to national recommendations for restrictions regarding the COVID-19 pandemic (e.g. social distancing).  This format is felt to be most appropriate for this patient at this time.  All issues noted in this document were discussed and addressed.  No physical exam was performed (except for noted visual exam findings with Video Visits).   I connected with Jannifer Hick by telephone and verified that I am speaking with the correct person using two identifiers. Location patient: home Location provider: work  Persons participating in the telephone visit: patient, provider  The limitations, risks, security and privacy concerns of performing an evaluation and management service by telephone and the availability of in person appointments have been discussed.  The patient expressed understanding and agreed to proceed.   Reason for visit: scheduled follow up.   HPI: She reports she is doing relatively well.  She has not been as active recently.  Discussed importance of exercise.  No chest pain.  No sob.  No acid reflux reported.  No abdominal pain.  Bowels moving.  She does report her nose drips/runs - all of the time.  Discussed have tried steroid nasal spray and astelin.  Not sure how compliant she is with nasal sprays.  Discussed a trial of atrovent nasal spray.  She does report her left knee and hip have hurt some.  Notices mostly if she walks a lot.  Discussed further evaluation.  She declines.  Wants to monitor.  Still able to do things she needs to do.  Discussed recent labs. Discussed slightly decreased potassium and foods with increased potassium.     ROS: See pertinent positives and negatives per HPI.  Past Medical History:  Diagnosis Date  . Anemia   . Anxiety   . Arthritis    "left hip" (08/15/2016)  . Basal cell carcinoma    forehead & nose; [facility] cut them off; deep cutting  . Depression   . GERD (gastroesophageal reflux disease)   . Hepatitis    "think I had it as a child"  . History of kidney stones   . HOH (hard of hearing)   . Hypercholesterolemia   . Osteoporosis   . Pyelonephritis, acute     s/p ureteral stent  . Vasomotor rhinitis    chronic    Past Surgical History:  Procedure Laterality Date  . ABDOMINAL HYSTERECTOMY  1968   secondary to prolapse, ovaries not removed  . BASAL CELL CARCINOMA EXCISION     forehead & nose; Cancer Center cut them off; deep cutting  . CATARACT EXTRACTION, BILATERAL Bilateral   . COLONOSCOPY    . DILATION AND CURETTAGE OF UTERUS     S/P miscarriage  . LITHOTRIPSY     Dr Yves Dill, nephrolithiasis  . PARATHYROIDECTOMY Left 08/15/2016    inferior minimally invasive Archie Endo 08/15/2016  . PARATHYROIDECTOMY Left 08/15/2016   Procedure: LEFT INFERIOR PARATHYROIDECTOMY;  Surgeon: Armandina Gemma, MD;  Location: [facility];  Service: General;  Laterality: Left;  . RETINAL DETACHMENT SURGERY    . URETERAL STENT PLACEMENT      Family History  Problem Relation Age of Onset  . Skin cancer Father   . Colon cancer Brother   . Hypertension Sister   . Diabetes Mellitus II Sister   . Breast cancer Sister 61  . Breast cancer Other  niece    SOCIAL HX: reviewed.    Current Outpatient Medications:  .  ALPRAZolam (XANAX) 0.25 MG tablet, TAKE ONE TABLET BY MOUTH ONCE DAILY AS NEEDED, Disp: 30 tablet, Rfl: 0 .  Calcium Carbonate-Vitamin D (CALCIUM 600+D) 600-400 MG-UNIT per tablet, Take 1 tablet by mouth 2 (two) times daily., Disp: , Rfl:  .  Cholecalciferol (VITAMIN D-3) 1000 UNITS CAPS, Take 1,000 Units by mouth daily. , Disp: , Rfl:  .  CRANBERRY PO, Take 1 tablet by mouth daily., Disp: , Rfl:  .  fish oil-omega-3 fatty acids 1000 MG capsule, Take 1 g by mouth daily., Disp: , Rfl:  .  ipratropium (ATROVENT) 0.03 % nasal spray, Place 1 spray into both nostrils every 12 (twelve) hours.,  Disp: 30 mL, Rfl: 1 .  mupirocin ointment (BACTROBAN) 2 %, Apply to affected area bid, Disp: 22 g, Rfl: 0 .  OVER THE COUNTER MEDICATION, Place 1 drop into both eyes daily as needed (for dry eyes)., Disp: , Rfl:  .  pantoprazole (PROTONIX) 40 MG tablet, Take 1 tablet by mouth once daily, Disp: 90 tablet, Rfl: 3 .  sertraline (ZOLOFT) 25 MG tablet, Take 1 tablet (25 mg total) by mouth daily., Disp: 30 tablet, Rfl: 1 .  simvastatin (ZOCOR) 40 MG tablet, Take 1 tablet by mouth in the evening, Disp: 90 tablet, Rfl: 3  EXAM:  GENERAL: alert, oriented, appears well and in no acute distress  HEENT: atraumatic, conjunttiva clear, no obvious abnormalities on inspection of external nose and ears  NECK: normal movements of the head and neck  LUNGS: on inspection no signs of respiratory distress, breathing rate appears normal, no obvious gross SOB, gasping or wheezing  CV: no obvious cyanosis  PSYCH/NEURO: pleasant and cooperative, no obvious depression or anxiety, speech and thought processing grossly intact  ASSESSMENT AND PLAN:  Discussed the following assessment and plan:  Anemia Follow cbc.  Has been receiving B12 injections.    Environmental allergies With increased runny nose and dripping.  Trial of atrovent nasal spray.  Follow.    GERD (gastroesophageal reflux disease) Controlled on protonix.  Follow.   Hypercholesterolemia On simvastatin.  Low cholesterol diet and exercise.  Follow lipid panel and liver function tests.    Left knee pain Some pain if walks a lot.  Declines any further evaluation or intervention.  Follow.   Primary hyperparathyroidism ([place]) S/p parathyroidectomy.  Follow calcium.   Stress Off zoloft.  Overall appears to be doing better.  Follow.    Vitamin D deficiency Follow vitamin D level.    Hypokalemia Discussed food with increased potassium.  Will send information.  Recheck potassium level in the next 2-3 weeks.     Orders Placed This Encounter    Procedures  . Potassium    Standing Status:   Future    Standing Expiration Date:   01/30/2021    Meds ordered this encounter  Medications  . ipratropium (ATROVENT) 0.03 % nasal spray    Sig: Place 1 spray into both nostrils every 12 (twelve) hours.    Dispense:  30 mL    Refill:  1     I discussed the assessment and treatment plan with the patient. The patient was provided an opportunity to ask questions and all were answered. The patient agreed with the plan and demonstrated an understanding of the instructions.   The patient was advised to call back or seek an in-person evaluation if the symptoms worsen or if the condition fails to  improve as anticipated.  I provided 23 minutes of non-face-to-face time during this encounter.   Einar Pheasant, MD

## 2020-01-31 ENCOUNTER — Encounter: Payer: Self-pay | Admitting: Internal Medicine

## 2020-01-31 ENCOUNTER — Telehealth: Payer: Self-pay | Admitting: Internal Medicine

## 2020-01-31 DIAGNOSIS — E876 Hypokalemia: Secondary | ICD-10-CM | POA: Insufficient documentation

## 2020-01-31 NOTE — Assessment & Plan Note (Signed)
With increased runny nose and dripping.  Trial of atrovent nasal spray.  Follow.

## 2020-01-31 NOTE — Assessment & Plan Note (Signed)
Follow vitamin D level.  

## 2020-01-31 NOTE — Assessment & Plan Note (Signed)
Discussed food with increased potassium.  Will send information.  Recheck potassium level in the next 2-3 weeks.

## 2020-01-31 NOTE — Assessment & Plan Note (Signed)
Follow cbc.  Has been receiving B12 injections.

## 2020-01-31 NOTE — Assessment & Plan Note (Signed)
Off zoloft.  Overall appears to be doing better.  Follow.

## 2020-01-31 NOTE — Assessment & Plan Note (Signed)
S/p parathyroidectomy.  Follow calcium.

## 2020-01-31 NOTE — Telephone Encounter (Signed)
Called and scheduled pt for 02/14/20

## 2020-01-31 NOTE — Telephone Encounter (Signed)
Potassium food list mailed. Please schedule non fasting lab appt in 2-3 weeks.

## 2020-01-31 NOTE — Assessment & Plan Note (Signed)
On simvastatin.  Low cholesterol diet and exercise.  Follow lipid panel and liver function tests.   

## 2020-01-31 NOTE — Assessment & Plan Note (Signed)
Controlled on protonix.  Follow.   

## 2020-01-31 NOTE — Assessment & Plan Note (Signed)
Some pain if walks a lot.  Declines any further evaluation or intervention.  Follow.

## 2020-01-31 NOTE — Telephone Encounter (Signed)
Pt was informed of lab results and slightly decreased potassium.  Please send info on foods with increased potassium. Also schedule her for a non fasting lab appt in the next 2-3 weeks.

## 2020-02-14 ENCOUNTER — Other Ambulatory Visit: Payer: Self-pay

## 2020-02-14 ENCOUNTER — Other Ambulatory Visit (INDEPENDENT_AMBULATORY_CARE_PROVIDER_SITE_OTHER): Payer: Medicare Other

## 2020-02-14 DIAGNOSIS — E876 Hypokalemia: Secondary | ICD-10-CM

## 2020-02-14 LAB — POTASSIUM: Potassium: 3.7 mEq/L (ref 3.5–5.1)

## 2020-02-29 ENCOUNTER — Ambulatory Visit (INDEPENDENT_AMBULATORY_CARE_PROVIDER_SITE_OTHER): Payer: Medicare Other

## 2020-02-29 ENCOUNTER — Other Ambulatory Visit: Payer: Self-pay

## 2020-02-29 DIAGNOSIS — E538 Deficiency of other specified B group vitamins: Secondary | ICD-10-CM

## 2020-02-29 MED ORDER — CYANOCOBALAMIN 1000 MCG/ML IJ SOLN
1000.0000 ug | Freq: Once | INTRAMUSCULAR | Status: AC
  Administered 2020-02-29: 13:00:00 1000 ug via INTRAMUSCULAR

## 2020-02-29 NOTE — Progress Notes (Addendum)
Patient presented for B 12 injection to left deltoid, patient voiced no concerns nor showed any signs of distress during injection.  Reviewed.  Dr Scott 

## 2020-04-04 ENCOUNTER — Other Ambulatory Visit: Payer: Self-pay

## 2020-04-04 ENCOUNTER — Ambulatory Visit (INDEPENDENT_AMBULATORY_CARE_PROVIDER_SITE_OTHER): Payer: Medicare Other

## 2020-04-04 DIAGNOSIS — E538 Deficiency of other specified B group vitamins: Secondary | ICD-10-CM

## 2020-04-04 MED ORDER — CYANOCOBALAMIN 1000 MCG/ML IJ SOLN
1000.0000 ug | Freq: Once | INTRAMUSCULAR | Status: AC
  Administered 2020-04-04: 1000 ug via INTRAMUSCULAR

## 2020-04-04 NOTE — Progress Notes (Addendum)
Patient presented for B 12 injection to left deltoid, patient voiced no concerns nor showed any signs of distress during injection.  Reviewed.  Dr Scott 

## 2020-05-09 ENCOUNTER — Other Ambulatory Visit: Payer: Self-pay

## 2020-05-09 ENCOUNTER — Ambulatory Visit (INDEPENDENT_AMBULATORY_CARE_PROVIDER_SITE_OTHER): Payer: Medicare Other

## 2020-05-09 DIAGNOSIS — E538 Deficiency of other specified B group vitamins: Secondary | ICD-10-CM | POA: Diagnosis not present

## 2020-05-09 MED ORDER — CYANOCOBALAMIN 1000 MCG/ML IJ SOLN
1000.0000 ug | Freq: Once | INTRAMUSCULAR | Status: AC
  Administered 2020-05-09: 1000 ug via INTRAMUSCULAR

## 2020-05-09 NOTE — Progress Notes (Addendum)
Patient presented for B 12 injection to left deltoid, patient voiced no concerns nor showed any signs of distress during injection.  Reviewed.  Dr Scott 

## 2020-06-08 ENCOUNTER — Other Ambulatory Visit: Payer: Medicare Other

## 2020-06-08 ENCOUNTER — Other Ambulatory Visit: Payer: Self-pay

## 2020-06-13 ENCOUNTER — Ambulatory Visit (INDEPENDENT_AMBULATORY_CARE_PROVIDER_SITE_OTHER): Payer: Medicare Other | Admitting: Internal Medicine

## 2020-06-13 ENCOUNTER — Encounter: Payer: Self-pay | Admitting: Internal Medicine

## 2020-06-13 ENCOUNTER — Other Ambulatory Visit: Payer: Self-pay

## 2020-06-13 VITALS — BP 122/58 | HR 76 | Temp 98.2°F | Ht 62.99 in | Wt 123.4 lb

## 2020-06-13 DIAGNOSIS — E559 Vitamin D deficiency, unspecified: Secondary | ICD-10-CM

## 2020-06-13 DIAGNOSIS — R350 Frequency of micturition: Secondary | ICD-10-CM | POA: Diagnosis not present

## 2020-06-13 DIAGNOSIS — D649 Anemia, unspecified: Secondary | ICD-10-CM | POA: Diagnosis not present

## 2020-06-13 DIAGNOSIS — R413 Other amnesia: Secondary | ICD-10-CM | POA: Diagnosis not present

## 2020-06-13 DIAGNOSIS — K219 Gastro-esophageal reflux disease without esophagitis: Secondary | ICD-10-CM | POA: Diagnosis not present

## 2020-06-13 DIAGNOSIS — E210 Primary hyperparathyroidism: Secondary | ICD-10-CM | POA: Diagnosis not present

## 2020-06-13 DIAGNOSIS — G479 Sleep disorder, unspecified: Secondary | ICD-10-CM | POA: Diagnosis not present

## 2020-06-13 DIAGNOSIS — F439 Reaction to severe stress, unspecified: Secondary | ICD-10-CM

## 2020-06-13 DIAGNOSIS — E7800 Pure hypercholesterolemia, unspecified: Secondary | ICD-10-CM | POA: Diagnosis not present

## 2020-06-13 DIAGNOSIS — M810 Age-related osteoporosis without current pathological fracture: Secondary | ICD-10-CM | POA: Diagnosis not present

## 2020-06-13 DIAGNOSIS — E538 Deficiency of other specified B group vitamins: Secondary | ICD-10-CM | POA: Diagnosis not present

## 2020-06-13 MED ORDER — CYANOCOBALAMIN 1000 MCG/ML IJ SOLN
1000.0000 ug | Freq: Once | INTRAMUSCULAR | Status: AC
  Administered 2020-06-13: 1000 ug via INTRAMUSCULAR

## 2020-06-13 NOTE — Progress Notes (Signed)
Patient ID: Sabrina Cervantes, female   DOB: 02/08/1929, 86 y.o.   MRN: [id number]   Subjective:    Patient ID: Sabrina Cervantes, female    DOB: January 08, 1929, 86 y.o.   MRN: [id number]  HPI This visit occurred during the SARS-CoV-2 public health emergency.  Safety protocols were in place, including screening questions prior to the visit, additional usage of staff PPE, and extensive cleaning of exam room while observing appropriate contact time as indicated for disinfecting solutions.  Patient here for a scheduled follow up.  She is accompanied by her daughter Sabrina Cervantes.  History obtained from both of them.  Her son lives with her.  She tries to stay as active as possible.  No chest pain.  No increased sob reported.  No abdominal pain.  Bowels moving.  Some increased urinary frequency.  No dysuria.  Reports does not sleep well.  Some issues with memory.  Not remembering things as well.  Repeating herself.    Past Medical History:  Diagnosis Date  . Anemia   . Anxiety   . Arthritis    "left hip" (08/15/2016)  . Basal cell carcinoma    forehead & nose; Cancer Center cut them off; deep cutting  . Depression   . GERD (gastroesophageal reflux disease)   . Hepatitis    "think I had it as a child"  . History of kidney stones   . HOH (hard of hearing)   . Hypercholesterolemia   . Osteoporosis   . Pyelonephritis, acute     s/p ureteral stent  . Vasomotor rhinitis    chronic   Past Surgical History:  Procedure Laterality Date  . ABDOMINAL HYSTERECTOMY  1968   secondary to prolapse, ovaries not removed  . BASAL CELL CARCINOMA EXCISION     forehead & nose; Cancer Center cut them off; deep cutting  . CATARACT EXTRACTION, BILATERAL Bilateral   . COLONOSCOPY    . DILATION AND CURETTAGE OF UTERUS     S/P miscarriage  . LITHOTRIPSY     Dr Yves Dill, nephrolithiasis  . PARATHYROIDECTOMY Left 08/15/2016    inferior minimally invasive Archie Endo 08/15/2016  . PARATHYROIDECTOMY Left 08/15/2016   Procedure: LEFT INFERIOR  PARATHYROIDECTOMY;  Surgeon: Armandina Gemma, MD;  Location: [facility];  Service: General;  Laterality: Left;  . RETINAL DETACHMENT SURGERY    . URETERAL STENT PLACEMENT     Family History  Problem Relation Age of Onset  . Skin cancer Father   . Colon cancer Brother   . Hypertension Sister   . Diabetes Mellitus II Sister   . Breast cancer Sister 47  . Breast cancer Other        niece   Social History   Socioeconomic History  . Marital status: Widowed    Spouse name: Not on file  . Number of children: 4  . Years of education: Not on file  . Highest education level: Not on file  Occupational History  . Not on file  Tobacco Use  . Smoking status: Never Smoker  . Smokeless tobacco: Never Used  Substance and Sexual Activity  . Alcohol use: No    Alcohol/week: 0.0 standard drinks  . Drug use: No  . Sexual activity: Never  Other Topics Concern  . Not on file  Social History Narrative  . Not on file   Social Determinants of Health   Financial Resource Strain:   . Difficulty of Paying Living Expenses: Not on file  Food Insecurity:   . Worried  About Running Out of Food in the Last Year: Not on file  . Ran Out of Food in the Last Year: Not on file  Transportation Needs:   . Lack of Transportation (Medical): Not on file  . Lack of Transportation (Non-Medical): Not on file  Physical Activity:   . Days of Exercise per Week: Not on file  . Minutes of Exercise per Session: Not on file  Stress:   . Feeling of Stress : Not on file  Social Connections:   . Frequency of Communication with Friends and Family: Not on file  . Frequency of Social Gatherings with Friends and Family: Not on file  . Attends Religious Services: Not on file  . Active Member of Clubs or Organizations: Not on file  . Attends Archivist Meetings: Not on file  . Marital Status: Not on file    Outpatient Encounter Medications as of 06/13/2020  Medication Sig  . Calcium Carbonate-Vitamin D (CALCIUM  600+D) 600-400 MG-UNIT per tablet Take 1 tablet by mouth 2 (two) times daily.  . Cholecalciferol (VITAMIN D-3) 1000 UNITS CAPS Take 1,000 Units by mouth daily.   Marland Kitchen CRANBERRY PO Take 1 tablet by mouth daily.  . fish oil-omega-3 fatty acids 1000 MG capsule Take 1 g by mouth daily.  Marland Kitchen ipratropium (ATROVENT) 0.03 % nasal spray Place 1 spray into both nostrils every 12 (twelve) hours.  . mupirocin ointment (BACTROBAN) 2 % Apply to affected area bid  . OVER THE COUNTER MEDICATION Place 1 drop into both eyes daily as needed (for dry eyes).  . pantoprazole (PROTONIX) 40 MG tablet Take 1 tablet by mouth once daily  . sertraline (ZOLOFT) 25 MG tablet Take 1 tablet (25 mg total) by mouth daily.  . simvastatin (ZOCOR) 40 MG tablet Take 1 tablet by mouth in the evening  . ALPRAZolam (XANAX) 0.25 MG tablet TAKE ONE TABLET BY MOUTH ONCE DAILY AS NEEDED (Patient not taking: Reported on 06/13/2020)  . [EXPIRED] cyanocobalamin ((VITAMIN B-12)) injection 1,000 mcg    No facility-administered encounter medications on file as of 06/13/2020.    Review of Systems  Constitutional:       Weight loss as outlined.  Eating.    HENT: Negative for sinus pressure and sore throat.        Some congestion.  Discussed using the nasal spray as we have discussed.    Respiratory: Negative for cough, chest tightness and shortness of breath.   Cardiovascular: Negative for chest pain, palpitations and leg swelling.  Gastrointestinal: Negative for abdominal pain, diarrhea, nausea and vomiting.  Genitourinary: Positive for frequency. Negative for difficulty urinating and dysuria.  Musculoskeletal: Negative for joint swelling and myalgias.  Skin: Negative for color change and rash.  Neurological: Negative for dizziness, light-headedness and headaches.       Memory issues as outlined.    Psychiatric/Behavioral: Negative for agitation and dysphoric mood.       Objective:    Physical Exam Vitals reviewed.  Constitutional:       General: She is not in acute distress.    Appearance: Normal appearance.  HENT:     Head: Normocephalic and atraumatic.     Right Ear: External ear normal.     Left Ear: External ear normal.  Eyes:     General: No scleral icterus.       Right eye: No discharge.        Left eye: No discharge.     Conjunctiva/sclera: Conjunctivae normal.  Neck:  Thyroid: No thyromegaly.  Cardiovascular:     Rate and Rhythm: Normal rate and regular rhythm.  Pulmonary:     Effort: No respiratory distress.     Breath sounds: Normal breath sounds. No wheezing.  Abdominal:     General: Bowel sounds are normal.     Palpations: Abdomen is soft.     Tenderness: There is no abdominal tenderness.  Musculoskeletal:        General: No swelling or tenderness.     Cervical back: Neck supple. No tenderness.  Lymphadenopathy:     Cervical: No cervical adenopathy.  Skin:    Findings: No erythema or rash.  Neurological:     Mental Status: She is alert.  Psychiatric:        Mood and Affect: Mood normal.        Behavior: Behavior normal.     BP (!) 122/58 (BP Location: Left Arm, Patient Position: Sitting)   Pulse 76   Temp 98.2 F (36.8 C)   Ht 5' 2.99" (1.6 m)   Wt 123 lb 6.4 oz (56 kg)   SpO2 98%   BMI 21.86 kg/m  Wt Readings from Last 3 Encounters:  06/13/20 123 lb 6.4 oz (56 kg)  01/27/20 138 lb (62.6 kg)  10/18/19 138 lb (62.6 kg)     Lab Results  Component Value Date   WBC 6.4 06/20/2020   HGB 10.5 (L) 06/20/2020   HCT 31.6 (L) 06/20/2020   PLT 273.0 06/20/2020   GLUCOSE 99 06/13/2020   CHOL 143 06/13/2020   TRIG 87.0 06/13/2020   HDL 61.00 06/13/2020   LDLDIRECT 78.0 04/30/2018   LDLCALC 65 06/13/2020   ALT 8 06/13/2020   AST 11 06/13/2020   NA 137 06/13/2020   K 3.8 06/13/2020   CL 101 06/13/2020   CREATININE 0.87 06/13/2020   BUN 19 06/13/2020   CO2 26 06/13/2020   TSH 2.23 06/13/2020    MM 3D SCREEN BREAST BILATERAL  Result Date: 11/01/2019 CLINICAL DATA:   Screening. EXAM: DIGITAL SCREENING BILATERAL MAMMOGRAM WITH TOMO AND CAD COMPARISON:  Previous exam(s). ACR Breast Density Category b: There are scattered areas of fibroglandular density. FINDINGS: There are no findings suspicious for malignancy. Images were processed with CAD. IMPRESSION: No mammographic evidence of malignancy. A result letter of this screening mammogram will be mailed directly to the patient. RECOMMENDATION: Screening mammogram in one year. (Code:SM-B-01Y) BI-RADS CATEGORY  1: Negative. Electronically Signed   By: Margarette Canada M.D.   On: 11/01/2019 13:35       Assessment & Plan:   Problem List Items Addressed This Visit    Vitamin D deficiency    Follow vitamin D level.       Relevant Orders   VITAMIN D 25 Hydroxy (Vit-D Deficiency, Fractures) (Completed)   Urinary frequency    Check urine and culture to confirm no infection.        Stress    Some increased stress as outlined.  Restart zoloft.  Start 25mg  q day.  Follow closely.  Adjust medication as needed.        Sleep difficulties    Start zoloft as outlined.  Follow.       Primary hyperparathyroidism ([place])    S/p parathyroidectomy.  Follow calcium.       Osteoporosis - Primary    Have discussed treatment options.  She has wanted to hold.  Calcium, vitamin d and weight bearing exercise.  Follow.       Memory  change    Mini mental status - 16/20.  Check routine labs, including B12 and tsh.  Consider neurology referral for formal evaluation.        Relevant Orders   TSH (Completed)   Vitamin B12 (Completed)   Urinalysis, Routine w reflex microscopic (Completed)   Urine Culture (Completed)   Hypercholesterolemia    On simvastatin.  Low cholesterol diet and exercise.  Follow lipid panel and liver function tests.        Relevant Orders   Hepatic function panel (Completed)   Lipid panel (Completed)   Basic metabolic panel (Completed)   GERD (gastroesophageal reflux disease)    Upper symptoms  controlled.  On protonix.       Anemia    B12 injections.  Check cbc.       Relevant Orders   CBC with Differential/Platelet (Completed)    Other Visit Diagnoses    Vitamin B 12 deficiency       Relevant Medications   cyanocobalamin ((VITAMIN B-12)) injection 1,000 mcg (Completed)       I spent 45 minutes with the patient and more than 50% of the time was spent in consultation regarding the above.  Time spent discussing current concerns and symptoms.  Time also spent discussing treatment options and further evaluation.  Einar Pheasant, MD

## 2020-06-14 LAB — CBC WITH DIFFERENTIAL/PLATELET
Basophils Absolute: 0.1 10*3/uL (ref 0.0–0.1)
Basophils Relative: 1.2 % (ref 0.0–3.0)
Eosinophils Absolute: 0.1 10*3/uL (ref 0.0–0.7)
Eosinophils Relative: 1.2 % (ref 0.0–5.0)
HCT: 29.3 % — ABNORMAL LOW (ref 36.0–46.0)
Hemoglobin: 10.1 g/dL — ABNORMAL LOW (ref 12.0–15.0)
Lymphocytes Relative: 33.2 % (ref 12.0–46.0)
Lymphs Abs: 1.9 10*3/uL (ref 0.7–4.0)
MCHC: 34.6 g/dL (ref 30.0–36.0)
MCV: 90 fl (ref 78.0–100.0)
Monocytes Absolute: 0.4 10*3/uL (ref 0.1–1.0)
Monocytes Relative: 7.1 % (ref 3.0–12.0)
Neutro Abs: 3.2 10*3/uL (ref 1.4–7.7)
Neutrophils Relative %: 57.3 % (ref 43.0–77.0)
Platelets: 249 10*3/uL (ref 150.0–400.0)
RBC: 3.25 Mil/uL — ABNORMAL LOW (ref 3.87–5.11)
RDW: 15 % (ref 11.5–15.5)
WBC: 5.6 10*3/uL (ref 4.0–10.5)

## 2020-06-14 LAB — URINALYSIS, ROUTINE W REFLEX MICROSCOPIC
Bilirubin Urine: NEGATIVE
Hgb urine dipstick: NEGATIVE
Ketones, ur: NEGATIVE
Nitrite: NEGATIVE
Specific Gravity, Urine: 1.01 (ref 1.000–1.030)
Total Protein, Urine: NEGATIVE
Urine Glucose: NEGATIVE
Urobilinogen, UA: 0.2 (ref 0.0–1.0)
pH: 6.5 (ref 5.0–8.0)

## 2020-06-14 LAB — HEPATIC FUNCTION PANEL
ALT: 8 U/L (ref 0–35)
AST: 11 U/L (ref 0–37)
Albumin: 4.2 g/dL (ref 3.5–5.2)
Alkaline Phosphatase: 40 U/L (ref 39–117)
Bilirubin, Direct: 0.1 mg/dL (ref 0.0–0.3)
Total Bilirubin: 0.6 mg/dL (ref 0.2–1.2)
Total Protein: 6 g/dL (ref 6.0–8.3)

## 2020-06-14 LAB — BASIC METABOLIC PANEL
BUN: 19 mg/dL (ref 6–23)
CO2: 26 mEq/L (ref 19–32)
Calcium: 10.2 mg/dL (ref 8.4–10.5)
Chloride: 101 mEq/L (ref 96–112)
Creatinine, Ser: 0.87 mg/dL (ref 0.40–1.20)
GFR: 61.22 mL/min (ref >60.00)
Glucose, Bld: 99 mg/dL (ref 70–99)
Potassium: 3.8 mEq/L (ref 3.5–5.1)
Sodium: 137 mEq/L (ref 135–145)

## 2020-06-14 LAB — LIPID PANEL
Cholesterol: 143 mg/dL (ref 0–200)
HDL: 61 mg/dL (ref >39.00)
LDL Cholesterol: 65 mg/dL (ref 0–99)
NonHDL: 82.43
Total CHOL/HDL Ratio: 2
Triglycerides: 87 mg/dL (ref 0.0–149.0)
VLDL: 17.4 mg/dL (ref 0.0–40.0)

## 2020-06-14 LAB — TSH: TSH: 2.23 u[IU]/mL (ref 0.35–4.50)

## 2020-06-14 LAB — VITAMIN D 25 HYDROXY (VIT D DEFICIENCY, FRACTURES): VITD: 96.06 ng/mL (ref 30.00–100.00)

## 2020-06-14 LAB — VITAMIN B12: Vitamin B-12: 390 pg/mL (ref 211–911)

## 2020-06-15 LAB — URINE CULTURE
MICRO NUMBER:: 10808568
SPECIMEN QUALITY:: ADEQUATE

## 2020-06-16 ENCOUNTER — Other Ambulatory Visit: Payer: Self-pay

## 2020-06-16 ENCOUNTER — Telehealth: Payer: Self-pay | Admitting: *Deleted

## 2020-06-16 DIAGNOSIS — D649 Anemia, unspecified: Secondary | ICD-10-CM

## 2020-06-16 MED ORDER — AMOXICILLIN 500 MG PO CAPS
500.0000 mg | ORAL_CAPSULE | Freq: Three times a day (TID) | ORAL | 0 refills | Status: DC
Start: 2020-06-16 — End: 2020-07-24

## 2020-06-16 NOTE — Telephone Encounter (Signed)
Please place future orders for lab appt.  

## 2020-06-18 NOTE — Telephone Encounter (Signed)
Order placed for f/u labs.  

## 2020-06-20 ENCOUNTER — Other Ambulatory Visit: Payer: Self-pay

## 2020-06-20 ENCOUNTER — Other Ambulatory Visit (INDEPENDENT_AMBULATORY_CARE_PROVIDER_SITE_OTHER): Payer: Medicare Other

## 2020-06-20 DIAGNOSIS — D649 Anemia, unspecified: Secondary | ICD-10-CM | POA: Diagnosis not present

## 2020-06-21 ENCOUNTER — Ambulatory Visit: Payer: Medicare Other

## 2020-06-21 LAB — CBC WITH DIFFERENTIAL/PLATELET
Basophils Absolute: 0 10*3/uL (ref 0.0–0.1)
Basophils Relative: 0.3 % (ref 0.0–3.0)
Eosinophils Absolute: 0.1 10*3/uL (ref 0.0–0.7)
Eosinophils Relative: 0.9 % (ref 0.0–5.0)
HCT: 31.6 % — ABNORMAL LOW (ref 36.0–46.0)
Hemoglobin: 10.5 g/dL — ABNORMAL LOW (ref 12.0–15.0)
Lymphocytes Relative: 32.8 % (ref 12.0–46.0)
Lymphs Abs: 2.1 10*3/uL (ref 0.7–4.0)
MCHC: 33.3 g/dL (ref 30.0–36.0)
MCV: 91.1 fl (ref 78.0–100.0)
Monocytes Absolute: 0.6 10*3/uL (ref 0.1–1.0)
Monocytes Relative: 9 % (ref 3.0–12.0)
Neutro Abs: 3.6 10*3/uL (ref 1.4–7.7)
Neutrophils Relative %: 57 % (ref 43.0–77.0)
Platelets: 273 10*3/uL (ref 150.0–400.0)
RBC: 3.46 Mil/uL — ABNORMAL LOW (ref 3.87–5.11)
RDW: 15.5 % (ref 11.5–15.5)
WBC: 6.4 10*3/uL (ref 4.0–10.5)

## 2020-06-21 LAB — IBC + FERRITIN
Ferritin: 41.2 ng/mL (ref 10.0–291.0)
Iron: 51 ug/dL (ref 42–145)
Saturation Ratios: 16.6 % — ABNORMAL LOW (ref 20.0–50.0)
Transferrin: 220 mg/dL (ref 212.0–360.0)

## 2020-06-25 ENCOUNTER — Encounter: Payer: Self-pay | Admitting: Internal Medicine

## 2020-06-25 ENCOUNTER — Telehealth: Payer: Self-pay | Admitting: Internal Medicine

## 2020-06-25 DIAGNOSIS — G479 Sleep disorder, unspecified: Secondary | ICD-10-CM | POA: Insufficient documentation

## 2020-06-25 DIAGNOSIS — D649 Anemia, unspecified: Secondary | ICD-10-CM

## 2020-06-25 DIAGNOSIS — R350 Frequency of micturition: Secondary | ICD-10-CM | POA: Insufficient documentation

## 2020-06-25 NOTE — Assessment & Plan Note (Signed)
Start zoloft as outlined.  Follow.

## 2020-06-25 NOTE — Assessment & Plan Note (Signed)
S/p parathyroidectomy.  Follow calcium.

## 2020-06-25 NOTE — Assessment & Plan Note (Signed)
Have discussed treatment options.  She has wanted to hold.  Calcium, vitamin d and weight bearing exercise.  Follow.

## 2020-06-25 NOTE — Assessment & Plan Note (Signed)
Upper symptoms controlled.  On protonix.  

## 2020-06-25 NOTE — Assessment & Plan Note (Signed)
Some increased stress as outlined.  Restart zoloft.  Start 25mg  q day.  Follow closely.  Adjust medication as needed.

## 2020-06-25 NOTE — Assessment & Plan Note (Signed)
On simvastatin.  Low cholesterol diet and exercise.  Follow lipid panel and liver function tests.   

## 2020-06-25 NOTE — Assessment & Plan Note (Signed)
Follow vitamin D level.  

## 2020-06-25 NOTE — Assessment & Plan Note (Signed)
B12 injections.  Check cbc.

## 2020-06-25 NOTE — Assessment & Plan Note (Signed)
Mini mental status - 16/20.  Check routine labs, including B12 and tsh.  Consider neurology referral for formal evaluation.

## 2020-06-25 NOTE — Assessment & Plan Note (Signed)
Check urine and culture to confirm no infection.  

## 2020-06-25 NOTE — Telephone Encounter (Signed)
Discussed her memory concerns with Sabrina Cervantes and her daughter [name].  Labs just recently checked.  You have a lab result note regarding her hgb and hematology referral.  Also, given her memory issues, would like to arrange a neurology consult and evaluation.  Let me know if agreeable.

## 2020-06-26 NOTE — Telephone Encounter (Signed)
Called the patient to give lab results. Sabrina Cervantes states she did not understand me and hung up the phone.

## 2020-07-04 DIAGNOSIS — H40003 Preglaucoma, unspecified, bilateral: Secondary | ICD-10-CM | POA: Diagnosis not present

## 2020-07-11 DIAGNOSIS — H353132 Nonexudative age-related macular degeneration, bilateral, intermediate dry stage: Secondary | ICD-10-CM | POA: Diagnosis not present

## 2020-07-12 NOTE — Telephone Encounter (Signed)
Left message for patient's daughter.  

## 2020-07-13 NOTE — Telephone Encounter (Signed)
Patients daughter would like her to be referred to hematology but would like to hold off on neurology for now. Will discuss with you at appt in October. Please include this number in referral for contact info [phone]

## 2020-07-13 NOTE — Telephone Encounter (Signed)
Pt daughter called back returning your call  Please call her at [phone]

## 2020-07-14 NOTE — Addendum Note (Signed)
Addended by: Alisa Graff on: 07/14/2020 04:33 AM   Modules accepted: Orders

## 2020-07-14 NOTE — Telephone Encounter (Signed)
Order placed for hematology referral.  

## 2020-07-18 ENCOUNTER — Ambulatory Visit (INDEPENDENT_AMBULATORY_CARE_PROVIDER_SITE_OTHER): Payer: Medicare Other

## 2020-07-18 ENCOUNTER — Other Ambulatory Visit: Payer: Self-pay

## 2020-07-18 ENCOUNTER — Telehealth: Payer: Self-pay | Admitting: Internal Medicine

## 2020-07-18 DIAGNOSIS — E538 Deficiency of other specified B group vitamins: Secondary | ICD-10-CM

## 2020-07-18 MED ORDER — CYANOCOBALAMIN 1000 MCG/ML IJ SOLN
1000.0000 ug | Freq: Once | INTRAMUSCULAR | Status: AC
  Administered 2020-07-18: 1000 ug via INTRAMUSCULAR

## 2020-07-18 MED ORDER — SIMVASTATIN 40 MG PO TABS: 40.0000 mg | ORAL_TABLET | Freq: Every evening | ORAL | 3 refills | Status: DC

## 2020-07-18 NOTE — Telephone Encounter (Signed)
Patient called in need refill on simvastatin (ZOCOR) 40 MG tablet

## 2020-07-18 NOTE — Progress Notes (Addendum)
Patient presented for B 12 injection to left deltoid, patient voiced no concerns nor showed any signs of distress during injection.  Reviewed.  Dr Scott 

## 2020-07-21 NOTE — Progress Notes (Signed)
[facility]  Telephone:(336) [phone] Fax:(336) [phone]  ID: [name] OB: 1929/08/04  MR#: [id number]  HYW#:[id number]  Patient Care Team: Einar Pheasant, MD as PCP - General (Internal Medicine)  CHIEF COMPLAINT: Anemia, unspecified.  INTERVAL HISTORY: Patient is an 86 year old female who was noted to have a decreased hemoglobin and mildly decreased iron saturation on routine blood work.  Patient currently feels well.  She admits to occasional weakness and fatigue, but is still able to get through her day without much problem.  She has no neurologic complaints.  She denies any recent fevers or illnesses.  She has a good appetite and denies weight loss.  She has no chest pain, shortness of breath, cough, or hemoptysis.  She denies any nausea, vomiting, constipation, or diarrhea.  She has no melena or hematochezia.  She has no urinary complaints.  Patient offers no further specific complaints today.  REVIEW OF SYSTEMS:   Review of Systems  Constitutional: Negative.  Negative for fever and weight loss.  Respiratory: Negative.  Negative for cough, hemoptysis and shortness of breath.   Cardiovascular: Negative.  Negative for chest pain and leg swelling.  Gastrointestinal: Negative.  Negative for abdominal pain, blood in stool and melena.  Genitourinary: Negative.  Negative for hematuria.  Musculoskeletal: Negative.  Negative for back pain.  Skin: Negative.  Negative for rash.  Neurological: Negative.  Negative for dizziness, focal weakness, weakness and headaches.  Psychiatric/Behavioral: Negative.  The patient is not nervous/anxious.     As per HPI. Otherwise, a complete review of systems is negative.  PAST MEDICAL HISTORY: Past Medical History:  Diagnosis Date  . Anemia   . Anxiety   . Arthritis    "left hip" (08/15/2016)  . Basal cell carcinoma    forehead & nose; [facility] cut them off; deep cutting  . Depression   . GERD (gastroesophageal reflux disease)    . Hepatitis    "think I had it as a child"  . History of kidney stones   . HOH (hard of hearing)   . Hypercholesterolemia   . Osteoporosis   . Pyelonephritis, acute     s/p ureteral stent  . Vasomotor rhinitis    chronic    PAST SURGICAL HISTORY: Past Surgical History:  Procedure Laterality Date  . ABDOMINAL HYSTERECTOMY  1968   secondary to prolapse, ovaries not removed  . BASAL CELL CARCINOMA EXCISION     forehead & nose; [facility] cut them off; deep cutting  . CATARACT EXTRACTION, BILATERAL Bilateral   . COLONOSCOPY    . DILATION AND CURETTAGE OF UTERUS     S/P miscarriage  . LITHOTRIPSY     Dr Yves Dill, nephrolithiasis  . PARATHYROIDECTOMY Left 08/15/2016    inferior minimally invasive Archie Endo 08/15/2016  . PARATHYROIDECTOMY Left 08/15/2016   Procedure: LEFT INFERIOR PARATHYROIDECTOMY;  Surgeon: Armandina Gemma, MD;  Location: [place] [facility];  Service: General;  Laterality: Left;  . RETINAL DETACHMENT SURGERY    . URETERAL STENT PLACEMENT      FAMILY HISTORY: Family History  Problem Relation Age of Onset  . Skin cancer Father   . Colon cancer Brother   . Hypertension Sister   . Diabetes Mellitus II Sister   . Breast cancer Sister 76  . Breast cancer Other        niece    ADVANCED DIRECTIVES (Y/N):  N  HEALTH MAINTENANCE: Social History   Tobacco Use  . Smoking status: Never Smoker  . Smokeless tobacco: Never Used  Substance Use Topics  . Alcohol use: No    Alcohol/week: 0.0 standard drinks  . Drug use: No     Colonoscopy:  PAP:  Bone density:  Lipid panel:  Allergies  Allergen Reactions  . Pyridium [Phenazopyridine Hcl] Itching and Rash    Current Outpatient Medications  Medication Sig Dispense Refill  . Calcium Carbonate-Vitamin D (CALCIUM 600+D) 600-400 MG-UNIT per tablet Take 1 tablet by mouth 2 (two) times daily.    . Cholecalciferol (VITAMIN D-3) 1000 UNITS CAPS Take 1,000 Units by mouth daily.     Marland Kitchen CRANBERRY PO Take 1 tablet by mouth daily.      . fish oil-omega-3 fatty acids 1000 MG capsule Take 1 g by mouth daily.    . mupirocin ointment (BACTROBAN) 2 % Apply to affected area bid 22 g 0  . OVER THE COUNTER MEDICATION Place 1 drop into both eyes daily as needed (for dry eyes).    . pantoprazole (PROTONIX) 40 MG tablet Take 1 tablet by mouth once daily 90 tablet 3  . simvastatin (ZOCOR) 40 MG tablet Take 1 tablet (40 mg total) by mouth every evening. 90 tablet 3  . ALPRAZolam (XANAX) 0.25 MG tablet TAKE ONE TABLET BY MOUTH ONCE DAILY AS NEEDED (Patient not taking: Reported on 07/24/2020) 30 tablet 0  . ipratropium (ATROVENT) 0.03 % nasal spray Place 1 spray into both nostrils every 12 (twelve) hours. (Patient not taking: Reported on 07/24/2020) 30 mL 1  . sertraline (ZOLOFT) 25 MG tablet Take 1 tablet (25 mg total) by mouth daily. (Patient not taking: Reported on 07/24/2020) 30 tablet 1   No current facility-administered medications for this visit.    OBJECTIVE: Vitals:   07/24/20 1505  BP: 118/65  Pulse: 80  Resp: 20  Temp: 99.3 F (37.4 C)  SpO2: 100%     Body mass index is 22.38 kg/m.    ECOG FS:0 - Asymptomatic  General: Well-developed, well-nourished, no acute distress. Eyes: Pink conjunctiva, anicteric sclera. HEENT: Normocephalic, moist mucous membranes. Lungs: No audible wheezing or coughing. Heart: Regular rate and rhythm. Abdomen: Soft, nontender, no obvious distention. Musculoskeletal: No edema, cyanosis, or clubbing. Neuro: Alert, answering all questions appropriately. Cranial nerves grossly intact. Skin: No rashes or petechiae noted. Psych: Normal affect. Lymphatics: No cervical, calvicular, axillary or inguinal LAD.   LAB RESULTS:  Lab Results  Component Value Date   NA 137 06/13/2020   K 3.8 06/13/2020   CL 101 06/13/2020   CO2 26 06/13/2020   GLUCOSE 99 06/13/2020   BUN 19 06/13/2020   CREATININE 0.87 06/13/2020   CALCIUM 10.2 06/13/2020   PROT 6.0 06/13/2020   ALBUMIN 4.2 06/13/2020   AST 11  06/13/2020   ALT 8 06/13/2020   ALKPHOS 40 06/13/2020   BILITOT 0.6 06/13/2020   GFRNONAA >60 08/16/2016   GFRAA >60 08/16/2016    Lab Results  Component Value Date   WBC 7.6 07/24/2020   NEUTROABS 3.6 06/20/2020   HGB 10.5 (L) 07/24/2020   HCT 31.9 (L) 07/24/2020   MCV 91.4 07/24/2020   PLT 265 07/24/2020     STUDIES: No results found.  ASSESSMENT: Anemia, unspecified.  PLAN:    1. Anemia, unspecified: Patient's hemoglobin is decreased to 10.5, but relatively stable.  Iron stores are at the low level of normal and slightly trending down over the last month, therefore she may benefit from 1 dose 510 mg IV Feraheme.  There appears to be no evidence of hemolysis.  Folate levels are  within normal limits and patient reports that she takes B12 injections once per month.  She may have underlying MDS and an Intelligen Myeloid panel has been ordered for completeness.  Patient does not require bone marrow biopsy at this time.  Return to clinic in 2 weeks to discuss her laboratory work and initiation of IV iron.  I spent a total of 45 minutes reviewing chart data, face-to-face evaluation with the patient, counseling and coordination of care as detailed above.  Patient expressed understanding and was in agreement with this plan. She also understands that She can call clinic at any time with any questions, concerns, or complaints.    Lloyd Huger, MD   07/24/2020 8:23 PM

## 2020-07-24 ENCOUNTER — Inpatient Hospital Stay: Payer: Medicare Other

## 2020-07-24 ENCOUNTER — Other Ambulatory Visit: Payer: Self-pay

## 2020-07-24 ENCOUNTER — Encounter: Payer: Self-pay | Admitting: Oncology

## 2020-07-24 ENCOUNTER — Inpatient Hospital Stay: Payer: Medicare Other | Attending: Oncology | Admitting: Oncology

## 2020-07-24 VITALS — BP 118/65 | HR 80 | Temp 99.3°F | Resp 20 | Wt 126.3 lb

## 2020-07-24 DIAGNOSIS — K219 Gastro-esophageal reflux disease without esophagitis: Secondary | ICD-10-CM | POA: Insufficient documentation

## 2020-07-24 DIAGNOSIS — Z85828 Personal history of other malignant neoplasm of skin: Secondary | ICD-10-CM | POA: Insufficient documentation

## 2020-07-24 DIAGNOSIS — Z90710 Acquired absence of both cervix and uterus: Secondary | ICD-10-CM | POA: Insufficient documentation

## 2020-07-24 DIAGNOSIS — D649 Anemia, unspecified: Secondary | ICD-10-CM | POA: Diagnosis not present

## 2020-07-24 DIAGNOSIS — Z79899 Other long term (current) drug therapy: Secondary | ICD-10-CM | POA: Diagnosis not present

## 2020-07-24 DIAGNOSIS — Z833 Family history of diabetes mellitus: Secondary | ICD-10-CM | POA: Insufficient documentation

## 2020-07-24 DIAGNOSIS — Z800 Family history of malignant neoplasm of digestive organs: Secondary | ICD-10-CM | POA: Diagnosis not present

## 2020-07-24 DIAGNOSIS — Z8249 Family history of ischemic heart disease and other diseases of the circulatory system: Secondary | ICD-10-CM | POA: Diagnosis not present

## 2020-07-24 DIAGNOSIS — R531 Weakness: Secondary | ICD-10-CM | POA: Insufficient documentation

## 2020-07-24 DIAGNOSIS — Z803 Family history of malignant neoplasm of breast: Secondary | ICD-10-CM | POA: Diagnosis not present

## 2020-07-24 DIAGNOSIS — Z87442 Personal history of urinary calculi: Secondary | ICD-10-CM | POA: Diagnosis not present

## 2020-07-24 DIAGNOSIS — F329 Major depressive disorder, single episode, unspecified: Secondary | ICD-10-CM | POA: Diagnosis not present

## 2020-07-24 DIAGNOSIS — E7800 Pure hypercholesterolemia, unspecified: Secondary | ICD-10-CM | POA: Diagnosis not present

## 2020-07-24 DIAGNOSIS — R5383 Other fatigue: Secondary | ICD-10-CM | POA: Diagnosis not present

## 2020-07-24 LAB — CBC
HCT: 31.9 % — ABNORMAL LOW (ref 36.0–46.0)
Hemoglobin: 10.5 g/dL — ABNORMAL LOW (ref 12.0–15.0)
MCH: 30.1 pg (ref 26.0–34.0)
MCHC: 32.9 g/dL (ref 30.0–36.0)
MCV: 91.4 fL (ref 80.0–100.0)
Platelets: 265 10*3/uL (ref 150–400)
RBC: 3.49 MIL/uL — ABNORMAL LOW (ref 3.87–5.11)
RDW: 14 % (ref 11.5–15.5)
WBC: 7.6 10*3/uL (ref 4.0–10.5)
nRBC: 0 % (ref 0.0–0.2)

## 2020-07-24 LAB — RETICULOCYTES
Immature Retic Fract: 12.9 % (ref 2.3–15.9)
RBC.: 3.48 MIL/uL — ABNORMAL LOW (ref 3.87–5.11)
Retic Count, Absolute: 41.1 10*3/uL (ref 19.0–186.0)
Retic Ct Pct: 1.2 % (ref 0.4–3.1)

## 2020-07-24 LAB — IRON AND TIBC
Iron: 44 ug/dL (ref 28–170)
Saturation Ratios: 15 % (ref 10.4–31.8)
TIBC: 300 ug/dL (ref 250–450)
UIBC: 256 ug/dL

## 2020-07-24 LAB — LACTATE DEHYDROGENASE: LDH: 110 U/L (ref 98–192)

## 2020-07-24 LAB — VITAMIN B12: Vitamin B-12: 909 pg/mL (ref 180–914)

## 2020-07-24 LAB — FERRITIN: Ferritin: 31 ng/mL (ref 11–307)

## 2020-07-24 LAB — FOLATE: Folate: 7.1 ng/mL (ref >5.9)

## 2020-07-24 LAB — DAT, POLYSPECIFIC AHG (ARMC ONLY): Polyspecific AHG test: NEGATIVE

## 2020-07-24 NOTE — Progress Notes (Signed)
Patient here today with daughter for new initial visit. Patient's daughter would like to discuss ways to increase patient's iron level. Also states patient is currently getting b12 shots monthly.

## 2020-07-25 LAB — HAPTOGLOBIN: Haptoglobin: 184 mg/dL (ref 41–333)

## 2020-08-03 NOTE — Progress Notes (Signed)
[facility]  Telephone:(336) [phone] Fax:(336) [phone]  ID: Sabrina Cervantes OB: 08/26/1929  MR#: [id number]  PIR#:[id number]  Patient Care Team: Einar Pheasant, MD as PCP - General (Internal Medicine)  CHIEF COMPLAINT: Anemia, unspecified.  INTERVAL HISTORY: Patient returns to clinic today for further evaluation and discussion of her laboratory results.  She continues to have occasional weakness and fatigue, but otherwise feels well. She has no neurologic complaints.  She denies any recent fevers or illnesses.  She has a good appetite and denies weight loss.  She has no chest pain, shortness of breath, cough, or hemoptysis.  She denies any nausea, vomiting, constipation, or diarrhea.  She has no melena or hematochezia.  She has no urinary complaints.  Patient offers no further specific complaints today.  REVIEW OF SYSTEMS:   Review of Systems  Constitutional: Negative.  Negative for fever and weight loss.  Respiratory: Negative.  Negative for cough, hemoptysis and shortness of breath.   Cardiovascular: Negative.  Negative for chest pain and leg swelling.  Gastrointestinal: Negative.  Negative for abdominal pain, blood in stool and melena.  Genitourinary: Negative.  Negative for hematuria.  Musculoskeletal: Negative.  Negative for back pain.  Skin: Negative.  Negative for rash.  Neurological: Negative.  Negative for dizziness, focal weakness, weakness and headaches.  Psychiatric/Behavioral: Negative.  The patient is not nervous/anxious.     As per HPI. Otherwise, a complete review of systems is negative.  PAST MEDICAL HISTORY: Past Medical History:  Diagnosis Date  . Anemia   . Anxiety   . Arthritis    "left hip" (08/15/2016)  . Basal cell carcinoma    forehead & nose; Cancer Center cut them off; deep cutting  . Depression   . GERD (gastroesophageal reflux disease)   . Hepatitis    "think I had it as a child"  . History of kidney stones   . HOH (hard of  hearing)   . Hypercholesterolemia   . Osteoporosis   . Pyelonephritis, acute     s/p ureteral stent  . Vasomotor rhinitis    chronic    PAST SURGICAL HISTORY: Past Surgical History:  Procedure Laterality Date  . ABDOMINAL HYSTERECTOMY  1968   secondary to prolapse, ovaries not removed  . BASAL CELL CARCINOMA EXCISION     forehead & nose; Cancer Center cut them off; deep cutting  . CATARACT EXTRACTION, BILATERAL Bilateral   . COLONOSCOPY    . DILATION AND CURETTAGE OF UTERUS     S/P miscarriage  . LITHOTRIPSY     Dr Yves Dill, nephrolithiasis  . PARATHYROIDECTOMY Left 08/15/2016    inferior minimally invasive Archie Endo 08/15/2016  . PARATHYROIDECTOMY Left 08/15/2016   Procedure: LEFT INFERIOR PARATHYROIDECTOMY;  Surgeon: Armandina Gemma, MD;  Location: [facility];  Service: General;  Laterality: Left;  . RETINAL DETACHMENT SURGERY    . URETERAL STENT PLACEMENT      FAMILY HISTORY: Family History  Problem Relation Age of Onset  . Skin cancer Father   . Colon cancer Brother   . Hypertension Sister   . Diabetes Mellitus II Sister   . Breast cancer Sister 74  . Breast cancer Other        niece    ADVANCED DIRECTIVES (Y/N):  N  HEALTH MAINTENANCE: Social History   Tobacco Use  . Smoking status: Never Smoker  . Smokeless tobacco: Never Used  Substance Use Topics  . Alcohol use: No    Alcohol/week: 0.0 standard drinks  . Drug use: No  Colonoscopy:  PAP:  Bone density:  Lipid panel:  Allergies  Allergen Reactions  . Pyridium [Phenazopyridine Hcl] Itching and Rash    Current Outpatient Medications  Medication Sig Dispense Refill  . Calcium Carbonate-Vitamin D (CALCIUM 600+D) 600-400 MG-UNIT per tablet Take 1 tablet by mouth 2 (two) times daily.    . Cholecalciferol (VITAMIN D-3) 1000 UNITS CAPS Take 1,000 Units by mouth daily.     Marland Kitchen CRANBERRY PO Take 1 tablet by mouth daily.    . fish oil-omega-3 fatty acids 1000 MG capsule Take 1 g by mouth daily.    Marland Kitchen ipratropium  (ATROVENT) 0.03 % nasal spray Place 1 spray into both nostrils every 12 (twelve) hours. 30 mL 1  . Multiple Vitamins-Iron (MULTIVITAMINS WITH IRON) TABS tablet Take 1 tablet by mouth daily.    . mupirocin ointment (BACTROBAN) 2 % Apply to affected area bid 22 g 0  . OVER THE COUNTER MEDICATION Place 1 drop into both eyes daily as needed (for dry eyes).    . pantoprazole (PROTONIX) 40 MG tablet Take 1 tablet by mouth once daily 90 tablet 3  . sertraline (ZOLOFT) 25 MG tablet Take 1 tablet (25 mg total) by mouth daily. 30 tablet 1  . simvastatin (ZOCOR) 40 MG tablet Take 1 tablet (40 mg total) by mouth every evening. 90 tablet 3  . ALPRAZolam (XANAX) 0.25 MG tablet TAKE ONE TABLET BY MOUTH ONCE DAILY AS NEEDED (Patient not taking: Reported on 08/07/2020) 30 tablet 0   No current facility-administered medications for this visit.    OBJECTIVE: Vitals:   08/07/20 1314  BP: 123/65  Pulse: 72  Resp: 20  Temp: 98.6 F (37 C)  SpO2: 96%     Body mass index is 21.51 kg/m.    ECOG FS:0 - Asymptomatic  General: Thin, no acute distress. Eyes: Pink conjunctiva, anicteric sclera. HEENT: Normocephalic, moist mucous membranes. Lungs: No audible wheezing or coughing. Heart: Regular rate and rhythm. Abdomen: Soft, nontender, no obvious distention. Musculoskeletal: No edema, cyanosis, or clubbing. Neuro: Alert, answering all questions appropriately. Cranial nerves grossly intact. Skin: No rashes or petechiae noted. Psych: Normal affect.   LAB RESULTS:  Lab Results  Component Value Date   NA 137 06/13/2020   K 3.8 06/13/2020   CL 101 06/13/2020   CO2 26 06/13/2020   GLUCOSE 99 06/13/2020   BUN 19 06/13/2020   CREATININE 0.87 06/13/2020   CALCIUM 10.2 06/13/2020   PROT 6.0 06/13/2020   ALBUMIN 4.2 06/13/2020   AST 11 06/13/2020   ALT 8 06/13/2020   ALKPHOS 40 06/13/2020   BILITOT 0.6 06/13/2020   GFRNONAA >60 08/16/2016   GFRAA >60 08/16/2016    Lab Results  Component Value Date    WBC 7.6 07/24/2020   NEUTROABS 3.6 06/20/2020   HGB 10.5 (L) 07/24/2020   HCT 31.9 (L) 07/24/2020   MCV 91.4 07/24/2020   PLT 265 07/24/2020   Lab Results  Component Value Date   IRON 44 07/24/2020   TIBC 300 07/24/2020   IRONPCTSAT 15 07/24/2020   Lab Results  Component Value Date   FERRITIN 31 07/24/2020    STUDIES: No results found.  ASSESSMENT: Anemia, unspecified.  PLAN:    1. Anemia, unspecified: Patient's hemoglobin is decreased to 10.5, but relatively stable.  Iron stores are at the low level of normal and slightly trending down over the last month.  The remainder of her laboratory work was either negative or within normal limits. Intelligen Myeloid panel is  pending at time of dictation.  Plan was to give 1 infusion of 510 mg IV Feraheme today, but patient developed an allergic reaction approximately halfway through her treatment and it was subsequently discontinued.  Patient had also been given dietary changes.  Return to clinic in 3 months with repeat laboratory work and further evaluation.    I spent a total of 30 minutes reviewing chart data, face-to-face evaluation with the patient, counseling and coordination of care as detailed above.  Patient expressed understanding and was in agreement with this plan. She also understands that She can call clinic at any time with any questions, concerns, or complaints.    Lloyd Huger, MD   08/07/2020 3:46 PM

## 2020-08-07 ENCOUNTER — Inpatient Hospital Stay: Payer: Medicare Other

## 2020-08-07 ENCOUNTER — Other Ambulatory Visit: Payer: Self-pay

## 2020-08-07 ENCOUNTER — Encounter: Payer: Self-pay | Admitting: Oncology

## 2020-08-07 ENCOUNTER — Inpatient Hospital Stay: Payer: Medicare Other | Attending: Oncology | Admitting: Oncology

## 2020-08-07 VITALS — BP 137/63 | HR 66 | Temp 98.4°F | Resp 20

## 2020-08-07 VITALS — BP 123/65 | HR 72 | Temp 98.6°F | Resp 20 | Ht 63.0 in | Wt 121.4 lb

## 2020-08-07 DIAGNOSIS — D649 Anemia, unspecified: Secondary | ICD-10-CM | POA: Diagnosis not present

## 2020-08-07 DIAGNOSIS — K219 Gastro-esophageal reflux disease without esophagitis: Secondary | ICD-10-CM | POA: Insufficient documentation

## 2020-08-07 DIAGNOSIS — D509 Iron deficiency anemia, unspecified: Secondary | ICD-10-CM | POA: Diagnosis not present

## 2020-08-07 DIAGNOSIS — F329 Major depressive disorder, single episode, unspecified: Secondary | ICD-10-CM | POA: Diagnosis not present

## 2020-08-07 DIAGNOSIS — F419 Anxiety disorder, unspecified: Secondary | ICD-10-CM | POA: Insufficient documentation

## 2020-08-07 DIAGNOSIS — E7800 Pure hypercholesterolemia, unspecified: Secondary | ICD-10-CM | POA: Insufficient documentation

## 2020-08-07 DIAGNOSIS — Z79899 Other long term (current) drug therapy: Secondary | ICD-10-CM | POA: Diagnosis not present

## 2020-08-07 DIAGNOSIS — D500 Iron deficiency anemia secondary to blood loss (chronic): Secondary | ICD-10-CM

## 2020-08-07 DIAGNOSIS — M1990 Unspecified osteoarthritis, unspecified site: Secondary | ICD-10-CM | POA: Diagnosis not present

## 2020-08-07 DIAGNOSIS — M810 Age-related osteoporosis without current pathological fracture: Secondary | ICD-10-CM | POA: Insufficient documentation

## 2020-08-07 MED ORDER — ONDANSETRON HCL 4 MG/2ML IJ SOLN
8.0000 mg | Freq: Once | INTRAMUSCULAR | Status: AC
  Administered 2020-08-07: 8 mg via INTRAVENOUS

## 2020-08-07 MED ORDER — SODIUM CHLORIDE 0.9 % IV SOLN
510.0000 mg | Freq: Once | INTRAVENOUS | Status: AC
  Administered 2020-08-07: 510 mg via INTRAVENOUS
  Filled 2020-08-07: qty 510

## 2020-08-07 MED ORDER — SODIUM CHLORIDE 0.9 % IV SOLN
Freq: Once | INTRAVENOUS | Status: AC
  Filled 2020-08-07: qty 250

## 2020-08-07 MED ORDER — METHYLPREDNISOLONE SODIUM SUCC 125 MG IJ SOLR
125.0000 mg | Freq: Once | INTRAMUSCULAR | Status: AC
  Administered 2020-08-07: 125 mg via INTRAVENOUS

## 2020-08-07 MED ORDER — DIPHENHYDRAMINE HCL 50 MG/ML IJ SOLN
50.0000 mg | Freq: Once | INTRAMUSCULAR | Status: AC
  Administered 2020-08-07: 50 mg via INTRAVENOUS

## 2020-08-07 MED ORDER — FAMOTIDINE IN NACL 20-0.9 MG/50ML-% IV SOLN
20.0000 mg | Freq: Once | INTRAVENOUS | Status: AC
  Administered 2020-08-07: 20 mg via INTRAVENOUS

## 2020-08-07 NOTE — Progress Notes (Signed)
1436: Pt reports feeling hot, flushing noted in face and ears, pt reports severe lower back pain, pt denies any other symptoms.  Beckey Rutter NP aware. Feraheme stopped and NS started to gravity.  1438: Pt begins dry heaving  Beckey Rutter NP at chairside.  Medications given per Protocol **see MAR** 1444: Pt given 8mg  IV Zofran per Beckey Rutter NP for Nausea and vomiting.  Pt confused and restless and unable to answer questions clearly. Per Beckey Rutter NP finish 500cc NS and reevaluate.  1459: Pt continues to be restless, mental status at baseline, pt denies all symptoms at this time.  1518: 500cc of NS complete, VS stable,Pt restless, mental status at baseline and continues to deny all symptoms. Per Beckey Rutter okay to discharge pt home.  1520: Patient's daughter aware. Pt and patient's daughter educated to call 911/report to ER in the event of an emergency or to call clinic with any questions. Keep follow up appointments as scheduled per Dr. Grayland Ormond. Pt and patient's daughter verbalizes understanding. Pt stable at discharge.

## 2020-08-08 LAB — INTELLIGEN MYELOID

## 2020-08-15 ENCOUNTER — Ambulatory Visit (INDEPENDENT_AMBULATORY_CARE_PROVIDER_SITE_OTHER): Payer: Medicare Other

## 2020-08-15 ENCOUNTER — Other Ambulatory Visit: Payer: Self-pay

## 2020-08-15 DIAGNOSIS — Z23 Encounter for immunization: Secondary | ICD-10-CM

## 2020-08-15 DIAGNOSIS — E538 Deficiency of other specified B group vitamins: Secondary | ICD-10-CM

## 2020-08-15 MED ORDER — CYANOCOBALAMIN 1000 MCG/ML IJ SOLN
1000.0000 ug | Freq: Once | INTRAMUSCULAR | Status: AC
  Administered 2020-08-15: 1000 ug via INTRAMUSCULAR

## 2020-08-15 NOTE — Progress Notes (Addendum)
Patient presented for B 12 injection to right deltoid, patient voiced no concerns nor showed any signs of distress during injection.  Reviewed.  Dr Scott 

## 2020-08-17 ENCOUNTER — Ambulatory Visit: Payer: Medicare Other

## 2020-08-18 ENCOUNTER — Other Ambulatory Visit: Payer: Self-pay | Admitting: Internal Medicine

## 2020-09-01 ENCOUNTER — Ambulatory Visit (INDEPENDENT_AMBULATORY_CARE_PROVIDER_SITE_OTHER): Payer: Medicare Other | Admitting: Internal Medicine

## 2020-09-01 ENCOUNTER — Other Ambulatory Visit: Payer: Self-pay

## 2020-09-01 ENCOUNTER — Encounter: Payer: Self-pay | Admitting: Internal Medicine

## 2020-09-01 DIAGNOSIS — D500 Iron deficiency anemia secondary to blood loss (chronic): Secondary | ICD-10-CM | POA: Diagnosis not present

## 2020-09-01 DIAGNOSIS — M25562 Pain in left knee: Secondary | ICD-10-CM | POA: Diagnosis not present

## 2020-09-01 DIAGNOSIS — Z9109 Other allergy status, other than to drugs and biological substances: Secondary | ICD-10-CM

## 2020-09-01 DIAGNOSIS — E559 Vitamin D deficiency, unspecified: Secondary | ICD-10-CM

## 2020-09-01 DIAGNOSIS — E7800 Pure hypercholesterolemia, unspecified: Secondary | ICD-10-CM

## 2020-09-01 DIAGNOSIS — F439 Reaction to severe stress, unspecified: Secondary | ICD-10-CM | POA: Diagnosis not present

## 2020-09-01 DIAGNOSIS — M810 Age-related osteoporosis without current pathological fracture: Secondary | ICD-10-CM

## 2020-09-01 DIAGNOSIS — G8929 Other chronic pain: Secondary | ICD-10-CM | POA: Diagnosis not present

## 2020-09-01 DIAGNOSIS — Z23 Encounter for immunization: Secondary | ICD-10-CM | POA: Diagnosis not present

## 2020-09-01 NOTE — Progress Notes (Signed)
Patient ID: Sabrina Cervantes, female   DOB: [birth year], 86 y.o.   MRN: [id number]   Subjective:    Patient ID: Sabrina Cervantes, female    DOB: 01-24-1929, 86 y.o.   MRN: [id number]  HPI This visit occurred during the SARS-CoV-2 public health emergency.  Safety protocols were in place, including screening questions prior to the visit, additional usage of staff PPE, and extensive cleaning of exam room while observing appropriate contact time as indicated for disinfecting solutions.  Patient here for a scheduled follow up. She is accompanied by her daughter.  History obtained from both of them.  She is being followed by hematology for iron deficient anemia.  She received iron infusion - had reaction.  She was given dietary changes with plans to f/u in 3 months with repeat labs and f/u with hematology.  No chest pain or sob reported.  Some allergy symptoms - chronic. Discussed using her nasal sprays.  No chest congestion or cough.  No abdominal pain.  Bowels moving.  Overall feels things are stable.  Some left knee arthritis. Does not limit her.    Past Medical History:  Diagnosis Date  . Anemia   . Anxiety   . Arthritis    "left hip" (08/15/2016)  . Basal cell carcinoma    forehead & nose; [facility] cut them off; deep cutting  . Depression   . GERD (gastroesophageal reflux disease)   . Hepatitis    "think I had it as a child"  . History of kidney stones   . HOH (hard of hearing)   . Hypercholesterolemia   . Osteoporosis   . Pyelonephritis, acute     s/p ureteral stent  . Vasomotor rhinitis    chronic   Past Surgical History:  Procedure Laterality Date  . ABDOMINAL HYSTERECTOMY  1968   secondary to prolapse, ovaries not removed  . BASAL CELL CARCINOMA EXCISION     forehead & nose; [facility] cut them off; deep cutting  . CATARACT EXTRACTION, BILATERAL Bilateral   . COLONOSCOPY    . DILATION AND CURETTAGE OF UTERUS     S/P miscarriage  . LITHOTRIPSY     Dr Yves Dill, nephrolithiasis  .  PARATHYROIDECTOMY Left 08/15/2016    inferior minimally invasive Archie Endo 08/15/2016  . PARATHYROIDECTOMY Left 08/15/2016   Procedure: LEFT INFERIOR PARATHYROIDECTOMY;  Surgeon: Armandina Gemma, MD;  Location: [place];  Service: General;  Laterality: Left;  . RETINAL DETACHMENT SURGERY    . URETERAL STENT PLACEMENT     Family History  Problem Relation Age of Onset  . Skin cancer Father   . Colon cancer Brother   . Hypertension Sister   . Diabetes Mellitus II Sister   . Breast cancer Sister 56  . Breast cancer Other        niece   Social History   Socioeconomic History  . Marital status: Widowed    Spouse name: Not on file  . Number of children: 4  . Years of education: Not on file  . Highest education level: Not on file  Occupational History  . Not on file  Tobacco Use  . Smoking status: Never Smoker  . Smokeless tobacco: Never Used  Substance and Sexual Activity  . Alcohol use: No    Alcohol/week: 0.0 standard drinks  . Drug use: No  . Sexual activity: Never  Other Topics Concern  . Not on file  Social History Narrative  . Not on file   Social Determinants of  Health   Financial Resource Strain:   . Difficulty of Paying Living Expenses: Not on file  Food Insecurity:   . Worried About Charity fundraiser in the Last Year: Not on file  . Ran Out of Food in the Last Year: Not on file  Transportation Needs:   . Lack of Transportation (Medical): Not on file  . Lack of Transportation (Non-Medical): Not on file  Physical Activity:   . Days of Exercise per Week: Not on file  . Minutes of Exercise per Session: Not on file  Stress:   . Feeling of Stress : Not on file  Social Connections:   . Frequency of Communication with Friends and Family: Not on file  . Frequency of Social Gatherings with Friends and Family: Not on file  . Attends Religious Services: Not on file  . Active Member of Clubs or Organizations: Not on file  . Attends Archivist Meetings: Not on file    . Marital Status: Not on file    Outpatient Encounter Medications as of 09/01/2020  Medication Sig  . ALPRAZolam (XANAX) 0.25 MG tablet TAKE ONE TABLET BY MOUTH ONCE DAILY AS NEEDED  . Calcium Carbonate-Vitamin D (CALCIUM 600+D) 600-400 MG-UNIT per tablet Take 1 tablet by mouth 2 (two) times daily.  . Cholecalciferol (VITAMIN D-3) 1000 UNITS CAPS Take 1,000 Units by mouth daily.   Marland Kitchen CRANBERRY PO Take 1 tablet by mouth daily.  . fish oil-omega-3 fatty acids 1000 MG capsule Take 1 g by mouth daily.  Marland Kitchen ipratropium (ATROVENT) 0.03 % nasal spray Place 1 spray into both nostrils every 12 (twelve) hours.  . Multiple Vitamins-Iron (MULTIVITAMINS WITH IRON) TABS tablet Take 1 tablet by mouth daily.  . mupirocin ointment (BACTROBAN) 2 % Apply to affected area bid  . OVER THE COUNTER MEDICATION Place 1 drop into both eyes daily as needed (for dry eyes).  . sertraline (ZOLOFT) 25 MG tablet Take 1 tablet (25 mg total) by mouth daily.  . simvastatin (ZOCOR) 40 MG tablet Take 1 tablet (40 mg total) by mouth every evening.  . [DISCONTINUED] pantoprazole (PROTONIX) 40 MG tablet Take 1 tablet by mouth once daily (Patient not taking: Reported on 09/01/2020)   No facility-administered encounter medications on file as of 09/01/2020.    Review of Systems  Constitutional: Negative for appetite change and unexpected weight change.  HENT: Negative for congestion and sinus pressure.   Respiratory: Negative for cough, chest tightness and shortness of breath.   Cardiovascular: Negative for chest pain, palpitations and leg swelling.  Gastrointestinal: Negative for abdominal pain, diarrhea, nausea and vomiting.  Genitourinary: Negative for difficulty urinating and dysuria.  Musculoskeletal: Negative for joint swelling and myalgias.  Skin: Negative for color change and rash.  Neurological: Negative for dizziness, light-headedness and headaches.  Psychiatric/Behavioral: Negative for agitation and dysphoric mood.        Objective:    Physical Exam Vitals reviewed.  Constitutional:      General: She is not in acute distress.    Appearance: Normal appearance.  HENT:     Head: Normocephalic and atraumatic.     Right Ear: External ear normal.     Left Ear: External ear normal.  Eyes:     General: No scleral icterus.       Right eye: No discharge.        Left eye: No discharge.     Conjunctiva/sclera: Conjunctivae normal.  Neck:     Thyroid: No thyromegaly.  Cardiovascular:  Rate and Rhythm: Normal rate and regular rhythm.  Pulmonary:     Effort: No respiratory distress.     Breath sounds: Normal breath sounds. No wheezing.  Abdominal:     General: Bowel sounds are normal.     Palpations: Abdomen is soft.     Tenderness: There is no abdominal tenderness.  Musculoskeletal:        General: No swelling or tenderness.     Cervical back: Neck supple. No tenderness.  Lymphadenopathy:     Cervical: No cervical adenopathy.  Skin:    Findings: No erythema or rash.  Neurological:     Mental Status: She is alert.  Psychiatric:        Mood and Affect: Mood normal.        Behavior: Behavior normal.     BP 120/62   Pulse 77   Temp 98.2 F (36.8 C)   Ht 5\' 3"  (1.6 m)   Wt 131 lb 9.6 oz (59.7 kg)   SpO2 98%   BMI 23.31 kg/m  Wt Readings from Last 3 Encounters:  09/01/20 131 lb 9.6 oz (59.7 kg)  08/07/20 121 lb 6.4 oz (55.1 kg)  07/24/20 126 lb 4.8 oz (57.3 kg)     Lab Results  Component Value Date   WBC 7.6 07/24/2020   HGB 10.5 (L) 07/24/2020   HCT 31.9 (L) 07/24/2020   PLT 265 07/24/2020   GLUCOSE 99 06/13/2020   CHOL 143 06/13/2020   TRIG 87.0 06/13/2020   HDL 61.00 06/13/2020   LDLDIRECT 78.0 04/30/2018   LDLCALC 65 06/13/2020   ALT 8 06/13/2020   AST 11 06/13/2020   NA 137 06/13/2020   K 3.8 06/13/2020   CL 101 06/13/2020   CREATININE 0.87 06/13/2020   BUN 19 06/13/2020   CO2 26 06/13/2020   TSH 2.23 06/13/2020    MM 3D SCREEN BREAST BILATERAL  Result  Date: 11/01/2019 CLINICAL DATA:  Screening. EXAM: DIGITAL SCREENING BILATERAL MAMMOGRAM WITH TOMO AND CAD COMPARISON:  Previous exam(s). ACR Breast Density Category b: There are scattered areas of fibroglandular density. FINDINGS: There are no findings suspicious for malignancy. Images were processed with CAD. IMPRESSION: No mammographic evidence of malignancy. A result letter of this screening mammogram will be mailed directly to the patient. RECOMMENDATION: Screening mammogram in one year. (Code:SM-B-01Y) BI-RADS CATEGORY  1: Negative. Electronically Signed   By: Margarette Canada M.D.   On: 11/01/2019 13:35       Assessment & Plan:   Problem List Items Addressed This Visit    Vitamin D deficiency    Follow vitamin d level.       Stress    On zoloft.  Appears to be doing better.  Follow.       Osteoporosis    Has declined prescription treatment.  Continue calcium, vitamin D and weight bearing exercise.  Follow.       Left knee pain    Some left knee arthritis.  Does not limit her activity.  Notify me if desires PT, etc.       Iron deficiency anemia    Saw hematology.  Attempted iron infusion.  Had reaction.  They gave her information on dietary adjustments.  Recommended f/u in 3 months.        Hypercholesterolemia    On simvastatin.  Low cholesterol diet and exercise.  Follow lipid panel and liver function tests.        Environmental allergies    Not taking her medication  or using medication regularly.  Nasal sprays as previously directed.  Follow.           Einar Pheasant, MD

## 2020-09-10 ENCOUNTER — Encounter: Payer: Self-pay | Admitting: Internal Medicine

## 2020-09-10 NOTE — Assessment & Plan Note (Signed)
Some left knee arthritis.  Does not limit her activity.  Notify me if desires PT, etc.

## 2020-09-10 NOTE — Assessment & Plan Note (Signed)
On zoloft.  Appears to be doing better.  Follow.

## 2020-09-10 NOTE — Assessment & Plan Note (Signed)
On simvastatin.  Low cholesterol diet and exercise.  Follow lipid panel and liver function tests.   

## 2020-09-10 NOTE — Assessment & Plan Note (Signed)
Has declined prescription treatment.  Continue calcium, vitamin D and weight bearing exercise.  Follow.

## 2020-09-10 NOTE — Assessment & Plan Note (Signed)
Follow vitamin d level.   

## 2020-09-10 NOTE — Assessment & Plan Note (Signed)
Not taking her medication or using medication regularly.  Nasal sprays as previously directed.  Follow.

## 2020-09-10 NOTE — Assessment & Plan Note (Signed)
Saw hematology.  Attempted iron infusion.  Had reaction.  They gave her information on dietary adjustments.  Recommended f/u in 3 months.

## 2020-09-19 ENCOUNTER — Ambulatory Visit (INDEPENDENT_AMBULATORY_CARE_PROVIDER_SITE_OTHER): Payer: Medicare Other

## 2020-09-19 ENCOUNTER — Other Ambulatory Visit: Payer: Self-pay

## 2020-09-19 DIAGNOSIS — E538 Deficiency of other specified B group vitamins: Secondary | ICD-10-CM | POA: Diagnosis not present

## 2020-09-19 MED ORDER — CYANOCOBALAMIN 1000 MCG/ML IJ SOLN
1000.0000 ug | Freq: Once | INTRAMUSCULAR | Status: AC
  Administered 2020-09-19: 1000 ug via INTRAMUSCULAR

## 2020-09-19 NOTE — Progress Notes (Addendum)
Patient presented for B 12 injection to left deltoid, patient voiced no concerns nor showed any signs of distress during injection.  Reviewed.  Dr Scott 

## 2020-10-02 ENCOUNTER — Telehealth: Payer: Self-pay | Admitting: Internal Medicine

## 2020-10-02 NOTE — Telephone Encounter (Signed)
Patient's sister, Lelon Frohlich called, [phone]. She wanted to know if Patient should still be getting a mammogram, if so patient needs a referral. Please call Ann back about this.

## 2020-10-03 NOTE — Telephone Encounter (Signed)
She is 89.  If she desires to stop getting mammogram - will respect her wishes.

## 2020-10-03 NOTE — Telephone Encounter (Signed)
Patients sister stated that patient feels that she no longer needs to get a mammogram but says if you feel that she still needs them then I can let her know and they will schedule one.

## 2020-10-04 NOTE — Telephone Encounter (Signed)
Ann is aware

## 2020-10-19 ENCOUNTER — Ambulatory Visit (INDEPENDENT_AMBULATORY_CARE_PROVIDER_SITE_OTHER): Payer: Medicare Other

## 2020-10-19 ENCOUNTER — Other Ambulatory Visit: Payer: Self-pay

## 2020-10-19 DIAGNOSIS — E538 Deficiency of other specified B group vitamins: Secondary | ICD-10-CM | POA: Diagnosis not present

## 2020-10-19 MED ORDER — CYANOCOBALAMIN 1000 MCG/ML IJ SOLN
1000.0000 ug | Freq: Once | INTRAMUSCULAR | Status: AC
  Administered 2020-10-19: 14:00:00 1000 ug via INTRAMUSCULAR

## 2020-10-19 NOTE — Progress Notes (Signed)
Patient presented for B 12 injection to left deltoid, patient voiced no concerns nor showed any signs of distress during injection. 

## 2020-11-13 ENCOUNTER — Inpatient Hospital Stay: Payer: Medicare Other

## 2020-11-13 ENCOUNTER — Inpatient Hospital Stay (HOSPITAL_BASED_OUTPATIENT_CLINIC_OR_DEPARTMENT_OTHER): Payer: Medicare Other | Admitting: Oncology

## 2020-11-13 ENCOUNTER — Inpatient Hospital Stay: Payer: Medicare Other | Attending: Oncology

## 2020-11-13 VITALS — BP 158/81 | HR 71 | Temp 98.1°F | Wt 137.0 lb

## 2020-11-13 DIAGNOSIS — E7800 Pure hypercholesterolemia, unspecified: Secondary | ICD-10-CM | POA: Diagnosis not present

## 2020-11-13 DIAGNOSIS — M1990 Unspecified osteoarthritis, unspecified site: Secondary | ICD-10-CM | POA: Insufficient documentation

## 2020-11-13 DIAGNOSIS — R5383 Other fatigue: Secondary | ICD-10-CM | POA: Insufficient documentation

## 2020-11-13 DIAGNOSIS — D500 Iron deficiency anemia secondary to blood loss (chronic): Secondary | ICD-10-CM | POA: Diagnosis not present

## 2020-11-13 DIAGNOSIS — Z79899 Other long term (current) drug therapy: Secondary | ICD-10-CM | POA: Diagnosis not present

## 2020-11-13 DIAGNOSIS — F418 Other specified anxiety disorders: Secondary | ICD-10-CM | POA: Diagnosis not present

## 2020-11-13 DIAGNOSIS — D649 Anemia, unspecified: Secondary | ICD-10-CM | POA: Insufficient documentation

## 2020-11-13 DIAGNOSIS — K219 Gastro-esophageal reflux disease without esophagitis: Secondary | ICD-10-CM | POA: Diagnosis not present

## 2020-11-13 DIAGNOSIS — R531 Weakness: Secondary | ICD-10-CM | POA: Insufficient documentation

## 2020-11-13 DIAGNOSIS — Z85828 Personal history of other malignant neoplasm of skin: Secondary | ICD-10-CM | POA: Insufficient documentation

## 2020-11-13 DIAGNOSIS — D509 Iron deficiency anemia, unspecified: Secondary | ICD-10-CM

## 2020-11-13 LAB — CBC WITH DIFFERENTIAL/PLATELET
Abs Immature Granulocytes: 0.05 10*3/uL (ref 0.00–0.07)
Basophils Absolute: 0.1 10*3/uL (ref 0.0–0.1)
Basophils Relative: 1 %
Eosinophils Absolute: 0.1 10*3/uL (ref 0.0–0.5)
Eosinophils Relative: 2 %
HCT: 35.1 % — ABNORMAL LOW (ref 36.0–46.0)
Hemoglobin: 11.7 g/dL — ABNORMAL LOW (ref 12.0–15.0)
Immature Granulocytes: 1 %
Lymphocytes Relative: 32 %
Lymphs Abs: 2.2 10*3/uL (ref 0.7–4.0)
MCH: 30.2 pg (ref 26.0–34.0)
MCHC: 33.3 g/dL (ref 30.0–36.0)
MCV: 90.5 fL (ref 80.0–100.0)
Monocytes Absolute: 0.5 10*3/uL (ref 0.1–1.0)
Monocytes Relative: 8 %
Neutro Abs: 3.9 10*3/uL (ref 1.7–7.7)
Neutrophils Relative %: 56 %
Platelets: 229 10*3/uL (ref 150–400)
RBC: 3.88 MIL/uL (ref 3.87–5.11)
RDW: 14.1 % (ref 11.5–15.5)
WBC: 6.9 10*3/uL (ref 4.0–10.5)
nRBC: 0 % (ref 0.0–0.2)

## 2020-11-13 LAB — IRON AND TIBC
Iron: 67 ug/dL (ref 28–170)
Saturation Ratios: 21 % (ref 10.4–31.8)
TIBC: 316 ug/dL (ref 250–450)
UIBC: 249 ug/dL

## 2020-11-13 LAB — FERRITIN: Ferritin: 37 ng/mL (ref 11–307)

## 2020-11-13 NOTE — Progress Notes (Signed)
[place]  [phone] [phone]  ID: Sabrina Cervantes OB: [birth year]  MR#: [id number]  [id number]  Patient Care Team: Einar Pheasant, MD as PCP - General (Internal Medicine)  CHIEF COMPLAINT: Anemia, unspecified.  INTERVAL HISTORY: Patient returns to clinic today for further evaluation and discussion of her laboratory results.  She continues to have occasional weakness and fatigue, but otherwise feels well. She has no neurologic complaints.  She denies any recent fevers or illnesses.  She has a good appetite and denies weight loss.  She has no chest pain, shortness of breath, cough, or hemoptysis.  She denies any nausea, vomiting, constipation, or diarrhea.  She has no melena or hematochezia.  She has no urinary complaints.  Patient offers no further specific complaints today.  REVIEW OF SYSTEMS:   Review of Systems  Constitutional: Negative.  Negative for fever and weight loss.  Respiratory: Negative.  Negative for cough, hemoptysis and shortness of breath.   Cardiovascular: Negative.  Negative for chest pain and leg swelling.  Gastrointestinal: Negative.  Negative for abdominal pain, blood in stool and melena.  Genitourinary: Negative.  Negative for hematuria.  Musculoskeletal: Negative.  Negative for back pain.  Skin: Negative.  Negative for rash.  Neurological: Negative.  Negative for dizziness, focal weakness, weakness and headaches.  Psychiatric/Behavioral: Negative.  The patient is not nervous/anxious.     As per HPI. Otherwise, a complete review of systems is negative.  PAST MEDICAL HISTORY: Past Medical History:  Diagnosis Date  . Anemia   . Anxiety   . Arthritis    "left hip" (08/15/2016)  . Basal cell carcinoma    forehead & nose; [facility] cut them off; deep cutting  . Depression   . GERD (gastroesophageal reflux disease)   . Hepatitis    "think I had it as a child"  . History of kidney stones   . HOH (hard of  hearing)   . Hypercholesterolemia   . Osteoporosis   . Pyelonephritis, acute     s/p ureteral stent  . Vasomotor rhinitis    chronic    PAST SURGICAL HISTORY: Past Surgical History:  Procedure Laterality Date  . ABDOMINAL HYSTERECTOMY  1968   secondary to prolapse, ovaries not removed  . BASAL CELL CARCINOMA EXCISION     forehead & nose; Cancer Center cut them off; deep cutting  . CATARACT EXTRACTION, BILATERAL Bilateral   . COLONOSCOPY    . DILATION AND CURETTAGE OF UTERUS     S/P miscarriage  . LITHOTRIPSY     Dr Yves Dill, nephrolithiasis  . PARATHYROIDECTOMY Left 08/15/2016    inferior minimally invasive Archie Endo 08/15/2016  . PARATHYROIDECTOMY Left 08/15/2016   Procedure: LEFT INFERIOR PARATHYROIDECTOMY;  Surgeon: Armandina Gemma, MD;  Location: [place];  Service: General;  Laterality: Left;  . RETINAL DETACHMENT SURGERY    . URETERAL STENT PLACEMENT      FAMILY HISTORY: Family History  Problem Relation Age of Onset  . Skin cancer Father   . Colon cancer Brother   . Hypertension Sister   . Diabetes Mellitus II Sister   . Breast cancer Sister 41  . Breast cancer Other        niece    ADVANCED DIRECTIVES (Y/N):  N  HEALTH MAINTENANCE: Social History   Tobacco Use  . Smoking status: Never Smoker  . Smokeless tobacco: Never Used  Substance Use Topics  . Alcohol use: No    Alcohol/week: 0.0 standard drinks  . Drug use: No  Colonoscopy:  PAP:  Bone density:  Lipid panel:  Allergies  Allergen Reactions  . Pyridium [Phenazopyridine Hcl] Itching and Rash    Current Outpatient Medications  Medication Sig Dispense Refill  . ALPRAZolam (XANAX) 0.25 MG tablet TAKE ONE TABLET BY MOUTH ONCE DAILY AS NEEDED 30 tablet 0  . Calcium Carbonate-Vitamin D (CALCIUM 600+D) 600-400 MG-UNIT per tablet Take 1 tablet by mouth 2 (two) times daily.    . Cholecalciferol (VITAMIN D-3) 1000 UNITS CAPS Take 1,000 Units by mouth daily.     Marland Kitchen CRANBERRY PO Take 1 tablet by mouth daily.     . fish oil-omega-3 fatty acids 1000 MG capsule Take 1 g by mouth daily.    Marland Kitchen ipratropium (ATROVENT) 0.03 % nasal spray Place 1 spray into both nostrils every 12 (twelve) hours. 30 mL 1  . Multiple Vitamins-Iron (MULTIVITAMINS WITH IRON) TABS tablet Take 1 tablet by mouth daily.    . mupirocin ointment (BACTROBAN) 2 % Apply to affected area bid 22 g 0  . OVER THE COUNTER MEDICATION Place 1 drop into both eyes daily as needed (for dry eyes).    . sertraline (ZOLOFT) 25 MG tablet Take 1 tablet (25 mg total) by mouth daily. 30 tablet 1  . simvastatin (ZOCOR) 40 MG tablet Take 1 tablet (40 mg total) by mouth every evening. 90 tablet 3   No current facility-administered medications for this visit.    OBJECTIVE: There were no vitals filed for this visit.   There is no height or weight on file to calculate BMI.    ECOG FS:0 - Asymptomatic  General: Thin, no acute distress. Eyes: Pink conjunctiva, anicteric sclera. HEENT: Normocephalic, moist mucous membranes. Lungs: No audible wheezing or coughing. Heart: Regular rate and rhythm. Abdomen: Soft, nontender, no obvious distention. Musculoskeletal: No edema, cyanosis, or clubbing. Neuro: Alert, answering all questions appropriately. Cranial nerves grossly intact. Skin: No rashes or petechiae noted. Psych: Normal affect.   LAB RESULTS:  Lab Results  Component Value Date   NA 137 06/13/2020   K 3.8 06/13/2020   CL 101 06/13/2020   CO2 26 06/13/2020   GLUCOSE 99 06/13/2020   BUN 19 06/13/2020   CREATININE 0.87 06/13/2020   CALCIUM 10.2 06/13/2020   PROT 6.0 06/13/2020   ALBUMIN 4.2 06/13/2020   AST 11 06/13/2020   ALT 8 06/13/2020   ALKPHOS 40 06/13/2020   BILITOT 0.6 06/13/2020   GFRNONAA >60 08/16/2016   GFRAA >60 08/16/2016    Lab Results  Component Value Date   WBC 7.6 07/24/2020   NEUTROABS 3.6 06/20/2020   HGB 10.5 (L) 07/24/2020   HCT 31.9 (L) 07/24/2020   MCV 91.4 07/24/2020   PLT 265 07/24/2020   Lab Results   Component Value Date   IRON 44 07/24/2020   TIBC 300 07/24/2020   IRONPCTSAT 15 07/24/2020   Lab Results  Component Value Date   FERRITIN 31 07/24/2020    STUDIES: No results found.  ASSESSMENT: Anemia, unspecified.  PLAN:    1. Anemia, unspecified:  -Likely secondary to malabsorption. -Was given 1 dose of IV Feraheme on 08/07/2020 with reaction. -She is receiving monthly B12 injections. -She is taking an oral multivitamin with iron in it. -Labs from today 11/13/2020 show improvement of her hemoglobin, ferritin and iron saturations. -Recommend she continue her multivitamin.  -RTC in 6 months with labs and md assessment.  Greater than 50% was spent in counseling and coordination of care with this patient including but not limited to  discussion of the relevant topics above (See A&P) including, but not limited to diagnosis and management of acute and chronic medical conditions.   Patient expressed understanding and was in agreement with this plan. She also understands that She can call clinic at any time with any questions, concerns, or complaints.    Jacquelin Hawking, NP   11/13/2020 1:04 PM

## 2020-11-21 ENCOUNTER — Ambulatory Visit (INDEPENDENT_AMBULATORY_CARE_PROVIDER_SITE_OTHER): Payer: Medicare Other

## 2020-11-21 ENCOUNTER — Other Ambulatory Visit: Payer: Self-pay

## 2020-11-21 DIAGNOSIS — E538 Deficiency of other specified B group vitamins: Secondary | ICD-10-CM

## 2020-11-21 MED ORDER — CYANOCOBALAMIN 1000 MCG/ML IJ SOLN
1000.0000 ug | Freq: Once | INTRAMUSCULAR | Status: AC
  Administered 2020-11-21: 1000 ug via INTRAMUSCULAR

## 2020-11-21 NOTE — Progress Notes (Signed)
Patient presented for B 12 injection to left deltoid, patient voiced no concerns nor showed any signs of distress during injection. 

## 2020-12-11 ENCOUNTER — Ambulatory Visit (INDEPENDENT_AMBULATORY_CARE_PROVIDER_SITE_OTHER): Payer: Medicare Other | Admitting: Internal Medicine

## 2020-12-11 ENCOUNTER — Other Ambulatory Visit: Payer: Self-pay

## 2020-12-11 VITALS — BP 122/68 | HR 74 | Temp 98.1°F | Resp 16 | Ht 63.0 in | Wt 139.6 lb

## 2020-12-11 DIAGNOSIS — R413 Other amnesia: Secondary | ICD-10-CM

## 2020-12-11 DIAGNOSIS — D500 Iron deficiency anemia secondary to blood loss (chronic): Secondary | ICD-10-CM | POA: Diagnosis not present

## 2020-12-11 DIAGNOSIS — E7800 Pure hypercholesterolemia, unspecified: Secondary | ICD-10-CM

## 2020-12-11 DIAGNOSIS — E559 Vitamin D deficiency, unspecified: Secondary | ICD-10-CM | POA: Diagnosis not present

## 2020-12-11 DIAGNOSIS — Z8639 Personal history of other endocrine, nutritional and metabolic disease: Secondary | ICD-10-CM | POA: Diagnosis not present

## 2020-12-11 DIAGNOSIS — Z9109 Other allergy status, other than to drugs and biological substances: Secondary | ICD-10-CM

## 2020-12-11 MED ORDER — NYSTATIN 100000 UNIT/GM EX CREA: 1.0000 "application " | TOPICAL_CREAM | Freq: Two times a day (BID) | CUTANEOUS | 0 refills | Status: DC

## 2020-12-11 NOTE — Progress Notes (Signed)
Patient ID: Sabrina Cervantes, female   DOB: [birth year], 87 y.o.   MRN: [id number]   Subjective:    Patient ID: Sabrina Cervantes, female    DOB: 1929-03-11, 87 y.o.   MRN: [id number]  HPI This visit occurred during the SARS-CoV-2 public health emergency.  Safety protocols were in place, including screening questions prior to the visit, additional usage of staff PPE, and extensive cleaning of exam room while observing appropriate contact time as indicated for disinfecting solutions.  Patient here for a scheduled follow up.  She is accompanied by her daughter.  History obtained from both of them.  Overall feels she is relatively stable.  Reports some increased runny nose and drainage.  Occasional sore throat with the drainage.  No chest congestion, chest pain or sob.  No acid reflux.  No abdominal pain.  Bowels moving.  Rash under breast.  Seeing hematology.  Taking multivitamin with iron and receiving b12 injections.  hgb improved.     Past Medical History:  Diagnosis Date  . Anemia   . Anxiety   . Arthritis    "left hip" (08/15/2016)  . Basal cell carcinoma    forehead & nose; Cancer Center cut them off; deep cutting  . Depression   . GERD (gastroesophageal reflux disease)   . Hepatitis    "think I had it as a child"  . History of kidney stones   . HOH (hard of hearing)   . Hypercholesterolemia   . Osteoporosis   . Pyelonephritis, acute     s/p ureteral stent  . Vasomotor rhinitis    chronic   Past Surgical History:  Procedure Laterality Date  . ABDOMINAL HYSTERECTOMY  1968   secondary to prolapse, ovaries not removed  . BASAL CELL CARCINOMA EXCISION     forehead & nose; Cancer Center cut them off; deep cutting  . CATARACT EXTRACTION, BILATERAL Bilateral   . COLONOSCOPY    . DILATION AND CURETTAGE OF UTERUS     S/P miscarriage  . LITHOTRIPSY     Dr Yves Dill, nephrolithiasis  . PARATHYROIDECTOMY Left 08/15/2016    inferior minimally invasive Archie Endo 08/15/2016  . PARATHYROIDECTOMY Left  08/15/2016   Procedure: LEFT INFERIOR PARATHYROIDECTOMY;  Surgeon: Armandina Gemma, MD;  Location: [facility];  Service: General;  Laterality: Left;  . RETINAL DETACHMENT SURGERY    . URETERAL STENT PLACEMENT     Family History  Problem Relation Age of Onset  . Skin cancer Father   . Colon cancer Brother   . Hypertension Sister   . Diabetes Mellitus II Sister   . Breast cancer Sister 32  . Breast cancer Other        niece   Social History   Socioeconomic History  . Marital status: Widowed    Spouse name: Not on file  . Number of children: 4  . Years of education: Not on file  . Highest education level: Not on file  Occupational History  . Not on file  Tobacco Use  . Smoking status: Never Smoker  . Smokeless tobacco: Never Used  Substance and Sexual Activity  . Alcohol use: No    Alcohol/week: 0.0 standard drinks  . Drug use: No  . Sexual activity: Never  Other Topics Concern  . Not on file  Social History Narrative  . Not on file   Social Determinants of Health   Financial Resource Strain: Not on file  Food Insecurity: Not on file  Transportation Needs: Not on file  Physical  Activity: Not on file  Stress: Not on file  Social Connections: Not on file    Outpatient Encounter Medications as of 12/11/2020  Medication Sig  . nystatin cream (MYCOSTATIN) Apply 1 application topically 2 (two) times daily. Apply under breasts bid  . ALPRAZolam (XANAX) 0.25 MG tablet TAKE ONE TABLET BY MOUTH ONCE DAILY AS NEEDED  . Calcium Carbonate-Vitamin D 600-400 MG-UNIT tablet Take 1 tablet by mouth 2 (two) times daily.  . Cholecalciferol (VITAMIN D-3) 1000 UNITS CAPS Take 1,000 Units by mouth daily.   Marland Kitchen CRANBERRY PO Take 1 tablet by mouth daily.  . fish oil-omega-3 fatty acids 1000 MG capsule Take 1 g by mouth daily.  Marland Kitchen ipratropium (ATROVENT) 0.03 % nasal spray Place 1 spray into both nostrils every 12 (twelve) hours.  . Multiple Vitamins-Iron (MULTIVITAMINS WITH IRON) TABS tablet Take 1  tablet by mouth daily.  . mupirocin ointment (BACTROBAN) 2 % Apply to affected area bid  . OVER THE COUNTER MEDICATION Place 1 drop into both eyes daily as needed (for dry eyes).  . sertraline (ZOLOFT) 25 MG tablet Take 1 tablet (25 mg total) by mouth daily.  . simvastatin (ZOCOR) 40 MG tablet Take 1 tablet (40 mg total) by mouth every evening.   No facility-administered encounter medications on file as of 12/11/2020.    Review of Systems  Constitutional: Negative for appetite change and unexpected weight change.  HENT: Positive for postnasal drip. Negative for sinus pressure.        Runny nose.   Respiratory: Negative for cough, chest tightness and shortness of breath.   Cardiovascular: Negative for chest pain, palpitations and leg swelling.  Gastrointestinal: Negative for abdominal pain, diarrhea, nausea and vomiting.  Genitourinary: Negative for difficulty urinating and dysuria.  Musculoskeletal: Negative for joint swelling and myalgias.  Skin: Negative for color change and rash.  Neurological: Negative for dizziness, light-headedness and headaches.  Psychiatric/Behavioral: Negative for agitation and dysphoric mood.       Objective:    Physical Exam Vitals reviewed.  Constitutional:      General: She is not in acute distress.    Appearance: Normal appearance.  HENT:     Head: Normocephalic and atraumatic.     Ears:     Comments: Cerumen - left ear.  Right ear - clear.     Nose: No congestion.     Mouth/Throat:     Mouth: Oropharynx is clear and moist.     Pharynx: Oropharynx is clear. No oropharyngeal exudate or posterior oropharyngeal erythema.  Eyes:     General: No scleral icterus.       Right eye: No discharge.        Left eye: No discharge.     Conjunctiva/sclera: Conjunctivae normal.  Neck:     Thyroid: No thyromegaly.  Cardiovascular:     Rate and Rhythm: Normal rate and regular rhythm.  Pulmonary:     Effort: No respiratory distress.     Breath sounds:  Normal breath sounds. No wheezing.  Abdominal:     General: Bowel sounds are normal.     Palpations: Abdomen is soft.     Tenderness: There is no abdominal tenderness.  Musculoskeletal:        General: No swelling, tenderness or edema.     Cervical back: Neck supple.  Lymphadenopathy:     Cervical: No cervical adenopathy.  Skin:    Findings: No erythema or rash.  Neurological:     Mental Status: She is alert.  Psychiatric:        Mood and Affect: Mood normal.        Behavior: Behavior normal.     BP 122/68   Pulse 74   Temp 98.1 F (36.7 C) (Oral)   Resp 16   Ht 5\' 3"  (1.6 m)   Wt 139 lb 9.6 oz (63.3 kg)   SpO2 99%   BMI 24.73 kg/m  Wt Readings from Last 3 Encounters:  12/11/20 139 lb 9.6 oz (63.3 kg)  11/13/20 137 lb (62.1 kg)  09/01/20 131 lb 9.6 oz (59.7 kg)     Lab Results  Component Value Date   WBC 6.9 11/13/2020   HGB 11.7 (L) 11/13/2020   HCT 35.1 (L) 11/13/2020   PLT 229 11/13/2020   GLUCOSE 99 06/13/2020   CHOL 143 06/13/2020   TRIG 87.0 06/13/2020   HDL 61.00 06/13/2020   LDLDIRECT 78.0 04/30/2018   LDLCALC 65 06/13/2020   ALT 8 06/13/2020   AST 11 06/13/2020   NA 137 06/13/2020   K 3.8 06/13/2020   CL 101 06/13/2020   CREATININE 0.87 06/13/2020   BUN 19 06/13/2020   CO2 26 06/13/2020   TSH 2.23 06/13/2020    MM 3D SCREEN BREAST BILATERAL  Result Date: 11/01/2019 CLINICAL DATA:  Screening. EXAM: DIGITAL SCREENING BILATERAL MAMMOGRAM WITH TOMO AND CAD COMPARISON:  Previous exam(s). ACR Breast Density Category b: There are scattered areas of fibroglandular density. FINDINGS: There are no findings suspicious for malignancy. Images were processed with CAD. IMPRESSION: No mammographic evidence of malignancy. A result letter of this screening mammogram will be mailed directly to the patient. RECOMMENDATION: Screening mammogram in one year. (Code:SM-B-01Y) BI-RADS CATEGORY  1: Negative. Electronically Signed   By: Margarette Canada M.D.   On: 11/01/2019  13:35       Assessment & Plan:   Problem List Items Addressed This Visit    Environmental allergies    Drainage and nasal dripping.  Discussed taking antihistamine and using nasal spray.  Nasal sores. Daughter will contact me and will send in [place] rx when needed.        History of hyperparathyroidism    S/p parathyroidectomy.  Follow calcium.       Hypercholesterolemia - Primary    On simvastatin.  Follow lipid panel and liver function tests.        Relevant Orders   Hepatic function panel   Lipid panel   Basic metabolic panel   Iron deficiency anemia    Saw hematology.  Attempted iron infusion.  Had reaction.  They gave her information on dietary adjustments.  Continue multivitamin with iron.  Recent evaluation - hgb improved.  Recommended 6 month f/u.        Memory change    Continue B12 injections.  Stable.  Follow.       Vitamin D deficiency    Continue vitamin D supplements.           Einar Pheasant, MD

## 2020-12-12 ENCOUNTER — Encounter: Payer: Self-pay | Admitting: Internal Medicine

## 2020-12-12 LAB — BASIC METABOLIC PANEL
BUN: 18 mg/dL (ref 6–23)
CO2: 32 mEq/L (ref 19–32)
Calcium: 10.2 mg/dL (ref 8.4–10.5)
Chloride: 103 mEq/L (ref 96–112)
Creatinine, Ser: 0.96 mg/dL (ref 0.40–1.20)
GFR: 52.24 mL/min — ABNORMAL LOW (ref >60.00)
Glucose, Bld: 81 mg/dL (ref 70–99)
Potassium: 3.9 mEq/L (ref 3.5–5.1)
Sodium: 140 mEq/L (ref 135–145)

## 2020-12-12 LAB — LIPID PANEL
Cholesterol: 177 mg/dL (ref 0–200)
HDL: 57.2 mg/dL (ref >39.00)
LDL Cholesterol: 88 mg/dL (ref 0–99)
NonHDL: 119.94
Total CHOL/HDL Ratio: 3
Triglycerides: 162 mg/dL — ABNORMAL HIGH (ref 0.0–149.0)
VLDL: 32.4 mg/dL (ref 0.0–40.0)

## 2020-12-12 LAB — HEPATIC FUNCTION PANEL
ALT: 13 U/L (ref 0–35)
AST: 17 U/L (ref 0–37)
Albumin: 4.3 g/dL (ref 3.5–5.2)
Alkaline Phosphatase: 55 U/L (ref 39–117)
Bilirubin, Direct: 0.1 mg/dL (ref 0.0–0.3)
Total Bilirubin: 0.6 mg/dL (ref 0.2–1.2)
Total Protein: 6.1 g/dL (ref 6.0–8.3)

## 2020-12-12 NOTE — Assessment & Plan Note (Signed)
Continue B12 injections.  Stable.  Follow.

## 2020-12-12 NOTE — Assessment & Plan Note (Signed)
On simvastatin.  Follow lipid panel and liver function tests.   

## 2020-12-12 NOTE — Assessment & Plan Note (Signed)
S/p parathyroidectomy.  Follow calcium.

## 2020-12-12 NOTE — Assessment & Plan Note (Signed)
Saw hematology.  Attempted iron infusion.  Had reaction.  They gave her information on dietary adjustments.  Continue multivitamin with iron.  Recent evaluation - hgb improved.  Recommended 6 month f/u.

## 2020-12-12 NOTE — Assessment & Plan Note (Signed)
Continue vitamin D supplements.  

## 2020-12-12 NOTE — Assessment & Plan Note (Signed)
Drainage and nasal dripping.  Discussed taking antihistamine and using nasal spray.  Nasal sores. Daughter will contact me and will send in [place] rx when needed.

## 2020-12-20 ENCOUNTER — Telehealth: Payer: Self-pay | Admitting: Internal Medicine

## 2020-12-20 NOTE — Telephone Encounter (Signed)
Pt received her [place] covid booster on 09/01/20 at [facility]

## 2020-12-20 NOTE — Telephone Encounter (Signed)
Documented in chart.

## 2020-12-22 ENCOUNTER — Telehealth: Payer: Self-pay | Admitting: Internal Medicine

## 2020-12-22 NOTE — Telephone Encounter (Signed)
Patient called in about her lab results

## 2020-12-25 ENCOUNTER — Ambulatory Visit (INDEPENDENT_AMBULATORY_CARE_PROVIDER_SITE_OTHER): Payer: Medicare Other | Admitting: Internal Medicine

## 2020-12-25 ENCOUNTER — Other Ambulatory Visit: Payer: Self-pay

## 2020-12-25 DIAGNOSIS — E538 Deficiency of other specified B group vitamins: Secondary | ICD-10-CM | POA: Diagnosis not present

## 2020-12-25 MED ORDER — CYANOCOBALAMIN 1000 MCG/ML IJ SOLN
1000.0000 ug | INTRAMUSCULAR | Status: DC
  Administered 2020-12-25 – 2021-10-01 (×4): 1000 ug via INTRAMUSCULAR

## 2020-12-25 NOTE — Progress Notes (Signed)
Patient tolerated well B12 shot in the left deltoid.

## 2020-12-25 NOTE — Telephone Encounter (Signed)
Patients daughter is aware and will let pt know

## 2021-01-09 DIAGNOSIS — H353132 Nonexudative age-related macular degeneration, bilateral, intermediate dry stage: Secondary | ICD-10-CM | POA: Diagnosis not present

## 2021-01-09 DIAGNOSIS — H02052 Trichiasis without entropian right lower eyelid: Secondary | ICD-10-CM | POA: Diagnosis not present

## 2021-01-25 ENCOUNTER — Ambulatory Visit (INDEPENDENT_AMBULATORY_CARE_PROVIDER_SITE_OTHER): Payer: Medicare Other

## 2021-01-25 ENCOUNTER — Other Ambulatory Visit: Payer: Self-pay

## 2021-01-25 DIAGNOSIS — E538 Deficiency of other specified B group vitamins: Secondary | ICD-10-CM | POA: Diagnosis not present

## 2021-01-25 MED ORDER — CYANOCOBALAMIN 1000 MCG/ML IJ SOLN
1000.0000 ug | Freq: Once | INTRAMUSCULAR | Status: AC
  Administered 2021-01-25: 1000 ug via INTRAMUSCULAR

## 2021-01-25 NOTE — Progress Notes (Signed)
Patient presented for B 12 injection to left deltoid, patient voiced no concerns nor showed any signs of distress during injection. 

## 2021-02-26 ENCOUNTER — Other Ambulatory Visit: Payer: Self-pay

## 2021-02-26 ENCOUNTER — Ambulatory Visit (INDEPENDENT_AMBULATORY_CARE_PROVIDER_SITE_OTHER): Payer: Medicare Other

## 2021-02-26 VITALS — BP 124/70 | HR 76 | Temp 98.1°F | Resp 16 | Ht 63.0 in | Wt 139.0 lb

## 2021-02-26 DIAGNOSIS — Z0000 Encounter for general adult medical examination without abnormal findings: Secondary | ICD-10-CM

## 2021-02-26 DIAGNOSIS — E538 Deficiency of other specified B group vitamins: Secondary | ICD-10-CM

## 2021-02-26 MED ORDER — CYANOCOBALAMIN 1000 MCG/ML IJ SOLN
1000.0000 ug | Freq: Once | INTRAMUSCULAR | Status: AC
  Administered 2021-02-26: 1000 ug via INTRAMUSCULAR

## 2021-02-26 NOTE — Progress Notes (Signed)
Patient presented for B 12 injection to right deltoid, patient voiced no concerns nor showed any signs of distress during injection. 

## 2021-02-26 NOTE — Progress Notes (Signed)
Subjective:   Sabrina Cervantes is a 87 y.o. female who presents for Medicare Annual (Subsequent) preventive examination.  Review of Systems    No ROS.  Medicare Wellness Virtual Visit.   Cardiac Risk Factors include: advanced age (>25men, >24 women);hypertension     Objective:    Today's Vitals   02/26/21 1543  BP: 124/70  Pulse: 76  Resp: 16  Temp: 98.1 F (36.7 C)  TempSrc: Oral  SpO2: 99%  Weight: 139 lb (63 kg)  Height: 5\' 3"  (1.6 m)   Body mass index is 24.62 kg/m.  Advanced Directives 02/26/2021 08/07/2020 07/24/2020 06/21/2019 03/03/2018 01/28/2017 08/15/2016  Does Patient Have a Medical Advance Directive? Yes Yes Yes Yes Yes Yes Yes  Type of [place] administrator Power of [place];Living will Living will;Healthcare Power of [place];Living will [place]  Does patient want to make changes to medical advance directive? No - Patient declined No - Patient declined No - Patient declined No - Patient declined No - Patient declined No - Patient declined No - Patient declined  Copy of [place] in Chart? Yes - validated most recent copy scanned in chart (See row information) Yes - validated most recent copy scanned in chart (See row information) No - copy requested Yes - validated most recent copy scanned in chart (See row information) No - copy requested No - copy requested Yes    Current Medications (verified) Outpatient Encounter Medications as of 02/26/2021  Medication Sig  . ALPRAZolam (XANAX) 0.25 MG tablet TAKE ONE TABLET BY MOUTH ONCE DAILY AS NEEDED  . Calcium Carbonate-Vitamin D 600-400 MG-UNIT tablet Take 1 tablet by mouth 2 (two) times daily.  . Cholecalciferol (VITAMIN D-3) 1000 UNITS CAPS Take 1,000 Units by mouth daily.   Marland Kitchen CRANBERRY PO Take 1 tablet by mouth daily.  . fish oil-omega-3 fatty acids 1000 MG  capsule Take 1 g by mouth daily.  Marland Kitchen ipratropium (ATROVENT) 0.03 % nasal spray Place 1 spray into both nostrils every 12 (twelve) hours.  . Multiple Vitamins-Iron (MULTIVITAMINS WITH IRON) TABS tablet Take 1 tablet by mouth daily.  . mupirocin ointment (BACTROBAN) 2 % Apply to affected area bid  . nystatin cream (MYCOSTATIN) Apply 1 application topically 2 (two) times daily. Apply under breasts bid  . OVER THE COUNTER MEDICATION Place 1 drop into both eyes daily as needed (for dry eyes).  . sertraline (ZOLOFT) 25 MG tablet Take 1 tablet (25 mg total) by mouth daily.  . simvastatin (ZOCOR) 40 MG tablet Take 1 tablet (40 mg total) by mouth every evening.   Facility-Administered Encounter Medications as of 02/26/2021  Medication  . cyanocobalamin ((VITAMIN B-12)) injection 1,000 mcg  . [COMPLETED] cyanocobalamin ((VITAMIN B-12)) injection 1,000 mcg    Allergies (verified) Pyridium [phenazopyridine hcl]   History: Past Medical History:  Diagnosis Date  . Anemia   . Anxiety   . Arthritis    "left hip" (08/15/2016)  . Basal cell carcinoma    forehead & nose; Cancer Center cut them off; deep cutting  . Depression   . GERD (gastroesophageal reflux disease)   . Hepatitis    "think I had it as a child"  . History of kidney stones   . HOH (hard of hearing)   . Hypercholesterolemia   . Osteoporosis   . Pyelonephritis, acute     s/p ureteral stent  . Vasomotor rhinitis  chronic   Past Surgical History:  Procedure Laterality Date  . ABDOMINAL HYSTERECTOMY  1968   secondary to prolapse, ovaries not removed  . BASAL CELL CARCINOMA EXCISION     forehead & nose; Cancer Center cut them off; deep cutting  . CATARACT EXTRACTION, BILATERAL Bilateral   . COLONOSCOPY    . DILATION AND CURETTAGE OF UTERUS     S/P miscarriage  . LITHOTRIPSY     Dr Yves Dill, nephrolithiasis  . PARATHYROIDECTOMY Left 08/15/2016    inferior minimally invasive Archie Endo 08/15/2016  . PARATHYROIDECTOMY Left  08/15/2016   Procedure: LEFT INFERIOR PARATHYROIDECTOMY;  Surgeon: Armandina Gemma, MD;  Location: [facility];  Service: General;  Laterality: Left;  . RETINAL DETACHMENT SURGERY    . URETERAL STENT PLACEMENT     Family History  Problem Relation Age of Onset  . Skin cancer Father   . Prostate cancer Father   . Colon cancer Brother   . Prostate cancer Brother   . Hypertension Sister   . Skin cancer Sister   . Diabetes Mellitus II Sister   . Breast cancer Sister 60  . Breast cancer Other        niece   Social History   Socioeconomic History  . Marital status: Widowed    Spouse name: Not on file  . Number of children: 4  . Years of education: Not on file  . Highest education level: Not on file  Occupational History  . Not on file  Tobacco Use  . Smoking status: Never Smoker  . Smokeless tobacco: Never Used  Substance and Sexual Activity  . Alcohol use: No    Alcohol/week: 0.0 standard drinks  . Drug use: No  . Sexual activity: Never  Other Topics Concern  . Not on file  Social History Narrative  . Not on file   Social Determinants of Health   Financial Resource Strain: Low Risk   . Difficulty of Paying Living Expenses: Not hard at all  Food Insecurity: No Food Insecurity  . Worried About Charity fundraiser in the Last Year: Never true  . Ran Out of Food in the Last Year: Never true  Transportation Needs: No Transportation Needs  . Lack of Transportation (Medical): No  . Lack of Transportation (Non-Medical): No  Physical Activity: Not on file  Stress: No Stress Concern Present  . Feeling of Stress : Not at all  Social Connections: Unknown  . Frequency of Communication with Friends and Family: Not on file  . Frequency of Social Gatherings with Friends and Family: More than three times a week  . Attends Religious Services: Not on file  . Active Member of Clubs or Organizations: Not on file  . Attends Archivist Meetings: Not on file  . Marital Status: Not on  file    Tobacco Counseling Counseling given: Not Answered   Clinical Intake:  Pre-visit preparation completed: Yes        Diabetes: No  How often do you need to have someone help you when you read instructions, pamphlets, or other written materials from your doctor or pharmacy?: 3 - Sometimes  Interpreter Needed?: No      Activities of Daily Living In your present state of health, do you have any difficulty performing the following activities: 02/26/2021  Hearing? N  Vision? N  Difficulty concentrating or making decisions? Y  Walking or climbing stairs? Y  Dressing or bathing? N  Doing errands, shopping? Y  Preparing Food and eating ?  Y  Using the Toilet? N  In the past six months, have you accidently leaked urine? Y  Comment Managed with daily pads  Do you have problems with loss of bowel control? N  Managing your Medications? N  Managing your Finances? Y  Comment Daughter manages  Housekeeping or managing your Housekeeping? Y  Some recent data might be hidden    Patient Care Team: Einar Pheasant, MD as PCP - General (Internal Medicine)  Indicate any recent Medical Services you may have received from other than [facility] providers in the past year (date may be approximate).     Assessment:   This is a routine wellness examination for [facility].  Hearing/Vision screen  Hearing Screening   125Hz  250Hz  500Hz  1000Hz  2000Hz  3000Hz  4000Hz  6000Hz  8000Hz   Right ear:           Left ear:           Comments: Patient note having difficulty hearing some conversational tones. Declines audiologist appointment.   Vision Screening Comments: Followed by [place] [facility]  Glaucoma  Wears corrective lenses  Cataract extraction, bilateral  Visual acuity not assessed per patient preference since they have regular follow up with the ophthalmologist   Dietary issues and exercise activities discussed: Current Exercise Habits: Home exercise routine,  Intensity: Mild  Goals    . Maintain Healthy Lifestyle     Follow up with pcp as needed      Depression Screen PHQ 2/9 Scores 02/26/2021 06/13/2020 06/21/2019 03/03/2018 01/28/2017 08/24/2015 07/26/2014  PHQ - 2 Score 0 0 1 0 0 0 0    Fall Risk Fall Risk  02/26/2021 06/13/2020 10/18/2019 09/29/2019 06/21/2019  Falls in the past year? 1 1 1  0 0  Comment - - - [place] Telephone Survey: data to providers prior to load -  Number falls in past yr: 0 0 0 - -  Injury with Fall? 0 0 0 - -  Comment - - - - -  Follow up Falls evaluation completed Falls evaluation completed Falls evaluation completed - -   FALL RISK PREVENTION PERTAINING TO THE HOME: Handrails in use when climbing stairs? Yes Home free of loose throw rugs in walkways, pet beds, electrical cords, etc? Yes  Adequate lighting in your home to reduce risk of falls? Yes   ASSISTIVE DEVICES UTILIZED TO PREVENT FALLS: Use of a cane, walker or w/c? No   TIMED UP AND GO: Was the test performed? Yes  Length of time to ambulate 10 feet: 15 sec.   Cognitive Function: Patient is alert. MMSE/6CIT declined.   MMSE - Mini Mental State Exam 02/26/2021 01/28/2017  Not completed: Unable to complete -  Orientation to time - 5  Orientation to Place - 5  Registration - 3  Attention/ Calculation - 3  Recall - 3  Language- name 2 objects - 2  Language- repeat - 1  Language- follow 3 step command - 3  Language- read & follow direction - 1  Write a sentence - 1  Copy design - 1  Total score - 28     6CIT Screen 06/21/2019 03/03/2018  What Year? 0 points 0 points  What month? 0 points 0 points  What time? 0 points 0 points  Count back from 20 0 points 0 points  Months in reverse 0 points 0 points  Repeat phrase 2 points 0 points  Total Score 2 0    Immunizations Immunization History  Administered Date(s) Administered  . Fluad Quad(high  Dose 65+) 07/07/2019, 08/15/2020  . Influenza Split 08/08/2012  . Influenza, High Dose Seasonal PF  08/25/2017, 08/25/2018  . Influenza,inj,Quad PF,6+ Mos 07/19/2013, 07/26/2014, 08/24/2015  . Influenza-Unspecified 08/08/2012, 07/19/2013, 07/26/2014, 08/08/2016  . PFIZER(Purple Top)SARS-COV-2 Vaccination 01/02/2020, 01/24/2020, 09/01/2020  . Pneumococcal Conjugate-13 01/18/2014  . Pneumococcal Polysaccharide-23 02/24/2017     TDAP status: Due, Education has been provided regarding the importance of this vaccine. Advised may receive this vaccine at local pharmacy or Health Dept. Aware to provide a copy of the vaccination record if obtained from local pharmacy or Health Dept. Verbalized acceptance and understanding. Deferred.  Health Maintenance Health Maintenance  Topic Date Due  . TETANUS/TDAP  Never done  . MAMMOGRAM  10/31/2020  . INFLUENZA VACCINE  06/04/2021  . DEXA SCAN  Completed  . COVID-19 Vaccine  Completed  . PNA vac Low Risk Adult  Completed  . HPV VACCINES  Aged Out   Colorectal cancer screening: No longer required.   Mammogram status: Completed 10/31/20. Repeat every year   Lung Cancer Screening: (Low Dose CT Chest recommended if Age 60-80 years, 30 pack-year currently smoking OR have quit w/in 15years.) does not qualify.   Hepatitis C Screening: does not qualify.  Dental Screening: Recommended annual dental exams for proper oral hygiene.  Community Resource Referral / Chronic Care Management: CRR required this visit?  No   CCM required this visit?  No      Plan:   Keep all routine maintenance appointments.   Next scheduled nurse visit 03/29/21 @ 2:00 B12-inj  Follow up 04/16/21.   I have personally reviewed and noted the following in the patient's chart:   . Medical and social history . Use of alcohol, tobacco or illicit drugs  . Current medications and supplements . Functional ability and status . Nutritional status . Physical activity . Advanced directives . List of other physicians . Hospitalizations, surgeries, and ER visits in previous 12  months . Vitals . Screenings to include cognitive, depression, and falls . Referrals and appointments  In addition, I have reviewed and discussed with patient certain preventive protocols, quality metrics, and best practice recommendations. A written personalized care plan for preventive services as well as general preventive health recommendations were provided to patient.     Ashok Pall, LPN   04/20/8371

## 2021-02-26 NOTE — Patient Instructions (Addendum)
Sabrina Cervantes , Thank you for taking time to come for your Medicare Wellness Visit. I appreciate your ongoing commitment to your health goals. Please review the following plan we discussed and let me know if I can assist you in the future.   These are the goals we discussed: Goals    . Maintain Healthy Lifestyle     Follow up with pcp as needed       This is a list of the screening recommended for you and due dates:  Health Maintenance  Topic Date Due  . Tetanus Vaccine  Never done  . Mammogram  10/31/2020  . Flu Shot  06/04/2021  . DEXA scan (bone density measurement)  Completed  . COVID-19 Vaccine  Completed  . Pneumonia vaccines  Completed  . HPV Vaccine  Aged Out    Immunizations Immunization History  Administered Date(s) Administered  . Fluad Quad(high Dose 65+) 07/07/2019, 08/15/2020  . Influenza Split 08/08/2012  . Influenza, High Dose Seasonal PF 08/25/2017, 08/25/2018  . Influenza,inj,Quad PF,6+ Mos 07/19/2013, 07/26/2014, 08/24/2015  . Influenza-Unspecified 08/08/2012, 07/19/2013, 07/26/2014, 08/08/2016  . PFIZER(Purple Top)SARS-COV-2 Vaccination 01/02/2020, 01/24/2020, 09/01/2020  . Pneumococcal Conjugate-13 01/18/2014  . Pneumococcal Polysaccharide-23 02/24/2017   Keep all routine maintenance appointments.   Next scheduled nurse visit 03/29/21 @ 2:00 B12-inj  Follow up 04/16/21  Advanced directives: on file  Follow up in one year for your annual wellness visit    Preventive Care 65 Years and Older, Female Preventive care refers to lifestyle choices and visits with your health care provider that can promote health and wellness. What does preventive care include?  A yearly physical exam. This is also called an annual well check.  Dental exams once or twice a year.  Routine eye exams. Ask your health care provider how often you should have your eyes checked.  Personal lifestyle choices, including:  Daily care of your teeth and gums.  Regular physical  activity.  Eating a healthy diet.  Avoiding tobacco and drug use.  Limiting alcohol use.  Practicing safe sex.  Taking low-dose aspirin every day.  Taking vitamin and mineral supplements as recommended by your health care provider. What happens during an annual well check? The services and screenings done by your health care provider during your annual well check will depend on your age, overall health, lifestyle risk factors, and family history of disease. Counseling  Your health care provider may ask you questions about your:  Alcohol use.  Tobacco use.  Drug use.  Emotional well-being.  Home and relationship well-being.  Sexual activity.  Eating habits.  History of falls.  Memory and ability to understand (cognition).  Work and work Statistician.  Reproductive health. Screening  You may have the following tests or measurements:  Height, weight, and BMI.  Blood pressure.  Lipid and cholesterol levels. These may be checked every 5 years, or more frequently if you are over 73 years old.  Skin check.  Lung cancer screening. You may have this screening every year starting at age 34 if you have a 30-pack-year history of smoking and currently smoke or have quit within the past 15 years.  Fecal occult blood test (FOBT) of the stool. You may have this test every year starting at age 75.  Flexible sigmoidoscopy or colonoscopy. You may have a sigmoidoscopy every 5 years or a colonoscopy every 10 years starting at age 83.  Hepatitis C blood test.  Hepatitis B blood test.  Sexually transmitted disease (STD) testing.  Diabetes  screening. This is done by checking your blood sugar (glucose) after you have not eaten for a while (fasting). You may have this done every 1-3 years.  Bone density scan. This is done to screen for osteoporosis. You may have this done starting at age 36.  Mammogram. This may be done every 1-2 years. Talk to your health care provider about  how often you should have regular mammograms. Talk with your health care provider about your test results, treatment options, and if necessary, the need for more tests. Vaccines  Your health care provider may recommend certain vaccines, such as:  Influenza vaccine. This is recommended every year.  Tetanus, diphtheria, and acellular pertussis (Tdap, Td) vaccine. You may need a Td booster every 10 years.  Zoster vaccine. You may need this after age 4.  Pneumococcal 13-valent conjugate (PCV13) vaccine. One dose is recommended after age 31.  Pneumococcal polysaccharide (PPSV23) vaccine. One dose is recommended after age 63. Talk to your health care provider about which screenings and vaccines you need and how often you need them. This information is not intended to replace advice given to you by your health care provider. Make sure you discuss any questions you have with your health care provider. Document Released: 11/17/2015 Document Revised: 07/10/2016 Document Reviewed: 08/22/2015 Elsevier Interactive Patient Education  2017 [place] Prevention in the Home Falls can cause injuries. They can happen to people of all ages. There are many things you can do to make your home safe and to help prevent falls. What can I do on the outside of my home?  Regularly fix the edges of walkways and driveways and fix any cracks.  Remove anything that might make you trip as you walk through a door, such as a raised step or threshold.  Trim any bushes or trees on the path to your home.  Use bright outdoor lighting.  Clear any walking paths of anything that might make someone trip, such as rocks or tools.  Regularly check to see if handrails are loose or broken. Make sure that both sides of any steps have handrails.  Any raised decks and porches should have guardrails on the edges.  Have any leaves, snow, or ice cleared regularly.  Use sand or salt on walking paths during  winter.  Clean up any spills in your garage right away. This includes oil or grease spills. What can I do in the bathroom?  Use night lights.  Install grab bars by the toilet and in the tub and shower. Do not use towel bars as grab bars.  Use non-skid mats or decals in the tub or shower.  If you need to sit down in the shower, use a plastic, non-slip stool.  Keep the floor dry. Clean up any water that spills on the floor as soon as it happens.  Remove soap buildup in the tub or shower regularly.  Attach bath mats securely with double-sided non-slip rug tape.  Do not have throw rugs and other things on the floor that can make you trip. What can I do in the bedroom?  Use night lights.  Make sure that you have a light by your bed that is easy to reach.  Do not use any sheets or blankets that are too big for your bed. They should not hang down onto the floor.  Have a firm chair that has side arms. You can use this for support while you get dressed.  Do not have throw rugs  and other things on the floor that can make you trip. What can I do in the kitchen?  Clean up any spills right away.  Avoid walking on wet floors.  Keep items that you use a lot in easy-to-reach places.  If you need to reach something above you, use a strong step stool that has a grab bar.  Keep electrical cords out of the way.  Do not use floor polish or wax that makes floors slippery. If you must use wax, use non-skid floor wax.  Do not have throw rugs and other things on the floor that can make you trip. What can I do with my stairs?  Do not leave any items on the stairs.  Make sure that there are handrails on both sides of the stairs and use them. Fix handrails that are broken or loose. Make sure that handrails are as long as the stairways.  Check any carpeting to make sure that it is firmly attached to the stairs. Fix any carpet that is loose or worn.  Avoid having throw rugs at the top or  bottom of the stairs. If you do have throw rugs, attach them to the floor with carpet tape.  Make sure that you have a light switch at the top of the stairs and the bottom of the stairs. If you do not have them, ask someone to add them for you. What else can I do to help prevent falls?  Wear shoes that:  Do not have high heels.  Have rubber bottoms.  Are comfortable and fit you well.  Are closed at the toe. Do not wear sandals.  If you use a stepladder:  Make sure that it is fully opened. Do not climb a closed stepladder.  Make sure that both sides of the stepladder are locked into place.  Ask someone to hold it for you, if possible.  Clearly mark and make sure that you can see:  Any grab bars or handrails.  First and last steps.  Where the edge of each step is.  Use tools that help you move around (mobility aids) if they are needed. These include:  Canes.  Walkers.  Scooters.  Crutches.  Turn on the lights when you go into a dark area. Replace any light bulbs as soon as they burn out.  Set up your furniture so you have a clear path. Avoid moving your furniture around.  If any of your floors are uneven, fix them.  If there are any pets around you, be aware of where they are.  Review your medicines with your doctor. Some medicines can make you feel dizzy. This can increase your chance of falling. Ask your doctor what other things that you can do to help prevent falls. This information is not intended to replace advice given to you by your health care provider. Make sure you discuss any questions you have with your health care provider. Document Released: 08/17/2009 Document Revised: 03/28/2016 Document Reviewed: 11/25/2014 Elsevier Interactive Patient Education  2017 [employer].

## 2021-03-23 DIAGNOSIS — Z23 Encounter for immunization: Secondary | ICD-10-CM | POA: Diagnosis not present

## 2021-03-29 ENCOUNTER — Ambulatory Visit (INDEPENDENT_AMBULATORY_CARE_PROVIDER_SITE_OTHER): Payer: Medicare Other

## 2021-03-29 ENCOUNTER — Other Ambulatory Visit: Payer: Self-pay

## 2021-03-29 DIAGNOSIS — E538 Deficiency of other specified B group vitamins: Secondary | ICD-10-CM

## 2021-03-29 MED ORDER — CYANOCOBALAMIN 1000 MCG/ML IJ SOLN
1000.0000 ug | Freq: Once | INTRAMUSCULAR | Status: AC
  Administered 2021-03-29: 1000 ug via INTRAMUSCULAR

## 2021-03-29 NOTE — Progress Notes (Signed)
Patient presented for B 12 injection to left deltoid, patient voiced no concerns nor showed any signs of distress during injection. 

## 2021-04-11 DIAGNOSIS — M1712 Unilateral primary osteoarthritis, left knee: Secondary | ICD-10-CM | POA: Diagnosis not present

## 2021-04-11 DIAGNOSIS — M25562 Pain in left knee: Secondary | ICD-10-CM | POA: Diagnosis not present

## 2021-04-16 ENCOUNTER — Ambulatory Visit (INDEPENDENT_AMBULATORY_CARE_PROVIDER_SITE_OTHER): Payer: Medicare Other | Admitting: Internal Medicine

## 2021-04-16 ENCOUNTER — Other Ambulatory Visit: Payer: Self-pay

## 2021-04-16 VITALS — BP 122/72 | HR 68 | Temp 97.9°F | Resp 16 | Ht 63.0 in | Wt 138.0 lb

## 2021-04-16 DIAGNOSIS — E7800 Pure hypercholesterolemia, unspecified: Secondary | ICD-10-CM | POA: Diagnosis not present

## 2021-04-16 DIAGNOSIS — R413 Other amnesia: Secondary | ICD-10-CM

## 2021-04-16 DIAGNOSIS — Z8639 Personal history of other endocrine, nutritional and metabolic disease: Secondary | ICD-10-CM

## 2021-04-16 DIAGNOSIS — D500 Iron deficiency anemia secondary to blood loss (chronic): Secondary | ICD-10-CM

## 2021-04-16 DIAGNOSIS — M25562 Pain in left knee: Secondary | ICD-10-CM | POA: Diagnosis not present

## 2021-04-16 DIAGNOSIS — Z9109 Other allergy status, other than to drugs and biological substances: Secondary | ICD-10-CM

## 2021-04-16 DIAGNOSIS — G8929 Other chronic pain: Secondary | ICD-10-CM

## 2021-04-16 DIAGNOSIS — F439 Reaction to severe stress, unspecified: Secondary | ICD-10-CM | POA: Diagnosis not present

## 2021-04-16 DIAGNOSIS — K219 Gastro-esophageal reflux disease without esophagitis: Secondary | ICD-10-CM | POA: Diagnosis not present

## 2021-04-16 NOTE — Progress Notes (Signed)
Patient ID: DIANNE WHELCHEL, female   DOB: [birth year], 87 y.o.   MRN: [id number]   Subjective:    Patient ID: [name], female    DOB: [birth year], 87 y.o.   MRN: [id number]  HPI This visit occurred during the SARS-CoV-2 public health emergency.  Safety protocols were in place, including screening questions prior to the visit, additional usage of staff PPE, and extensive cleaning of exam room while observing appropriate contact time as indicated for disinfecting solutions.   Patient here for a scheduled follow up.  She is accompanied by her daughter.  History obtained from both of them.  Here to follow up regarding her cholesterol, knee pain and memory change.  S/p cortisone injection.  Seeing ortho.  Is able to get around her home.  Son lives with her.  Discussed using walker/cane.  No chest pain or sob reported.  Some allergy symptoms - chronic.  No acute issues.  Eating.  No abdominal pain.  Bowels moving.  Daughter feels - stable.   Past Medical History:  Diagnosis Date   Anemia    Anxiety    Arthritis    "left hip" (08/15/2016)   Basal cell carcinoma    forehead & nose; [facility]; deep cutting   Depression    GERD (gastroesophageal reflux disease)    Hepatitis    "think I had it as a child"   History of kidney stones    HOH (hard of hearing)    Hypercholesterolemia    Osteoporosis    Pyelonephritis, acute     s/p ureteral stent   Vasomotor rhinitis    chronic   Past Surgical History:  Procedure Laterality Date   ABDOMINAL HYSTERECTOMY  1968   secondary to prolapse, ovaries not removed   BASAL CELL CARCINOMA EXCISION     forehead & nose; [place] cut them off; deep cutting   CATARACT EXTRACTION, BILATERAL Bilateral    COLONOSCOPY     DILATION AND CURETTAGE OF UTERUS     S/P miscarriage   LITHOTRIPSY     Dr Yves Dill, nephrolithiasis   PARATHYROIDECTOMY Left 08/15/2016    inferior minimally invasive /notes 08/15/2016   PARATHYROIDECTOMY Left 08/15/2016    Procedure: LEFT INFERIOR PARATHYROIDECTOMY;  Surgeon: Armandina Gemma, MD;  Location: [facility];  Service: General;  Laterality: Left;   RETINAL DETACHMENT SURGERY     URETERAL STENT PLACEMENT     Family History  Problem Relation Age of Onset   Skin cancer Father    Prostate cancer Father    Colon cancer Brother    Prostate cancer Brother    Hypertension Sister    Skin cancer Sister    Diabetes Mellitus II Sister    Breast cancer Sister 55   Breast cancer Other        niece   Social History   Socioeconomic History   Marital status: Widowed    Spouse name: Not on file   Number of children: 4   Years of education: Not on file   Highest education level: Not on file  Occupational History   Not on file  Tobacco Use   Smoking status: Never   Smokeless tobacco: Never  Substance and Sexual Activity   Alcohol use: No    Alcohol/week: 0.0 standard drinks   Drug use: No   Sexual activity: Never  Other Topics Concern   Not on file  Social History Narrative   Not on file   Social Determinants of  Health   Financial Resource Strain: Low Risk    Difficulty of Paying Living Expenses: Not hard at all  Food Insecurity: No Food Insecurity   Worried About [place] in the Last Year: Never true   Ran Out of Food in the Last Year: Never true  Transportation Needs: No Transportation Needs   Lack of Transportation (Medical): No   Lack of Transportation (Non-Medical): No  Physical Activity: Not on file  Stress: No Stress Concern Present   Feeling of Stress : Not at all  Social Connections: Unknown   Frequency of Communication with Friends and Family: Not on file   Frequency of Social Gatherings with Friends and Family: More than three times a week   Attends Religious Services: Not on file   Active Member of Clubs or Organizations: Not on file   Attends Archivist Meetings: Not on file   Marital Status: Not on file    Outpatient Encounter Medications as of 04/16/2021   Medication Sig   Calcium Carbonate-Vitamin D 600-400 MG-UNIT tablet Take 1 tablet by mouth 2 (two) times daily.   Cholecalciferol (VITAMIN D-3) 1000 UNITS CAPS Take 1,000 Units by mouth daily.    CRANBERRY PO Take 1 tablet by mouth daily.   fish oil-omega-3 fatty acids 1000 MG capsule Take 1 g by mouth daily.   ipratropium (ATROVENT) 0.03 % nasal spray Place 1 spray into both nostrils every 12 (twelve) hours.   Multiple Vitamins-Iron (MULTIVITAMINS WITH IRON) TABS tablet Take 1 tablet by mouth daily.   mupirocin ointment (BACTROBAN) 2 % Apply to affected area bid   OVER THE COUNTER MEDICATION Place 1 drop into both eyes daily as needed (for dry eyes).   simvastatin (ZOCOR) 40 MG tablet Take 1 tablet (40 mg total) by mouth every evening.   [DISCONTINUED] ALPRAZolam (XANAX) 0.25 MG tablet TAKE ONE TABLET BY MOUTH ONCE DAILY AS NEEDED (Patient not taking: Reported on 04/16/2021)   [DISCONTINUED] nystatin cream (MYCOSTATIN) Apply 1 application topically 2 (two) times daily. Apply under breasts bid (Patient not taking: Reported on 04/16/2021)   [DISCONTINUED] sertraline (ZOLOFT) 25 MG tablet Take 1 tablet (25 mg total) by mouth daily. (Patient not taking: Reported on 04/16/2021)   Facility-Administered Encounter Medications as of 04/16/2021  Medication   cyanocobalamin ((VITAMIN B-12)) injection 1,000 mcg     Review of Systems  Constitutional:  Negative for appetite change and unexpected weight change.  HENT:  Negative for congestion and sinus pressure.        Allergy symptoms.   Respiratory:  Negative for cough, chest tightness and shortness of breath.   Cardiovascular:  Negative for chest pain and palpitations.  Gastrointestinal:  Negative for abdominal pain, diarrhea, nausea and vomiting.  Genitourinary:  Negative for difficulty urinating and dysuria.  Musculoskeletal:  Negative for myalgias.       Knee pain as outlined.    Skin:  Negative for color change and rash.  Neurological:  Negative  for dizziness, light-headedness and headaches.  Psychiatric/Behavioral:  Negative for agitation and dysphoric mood.       Objective:    Physical Exam Vitals reviewed.  Constitutional:      General: She is not in acute distress.    Appearance: Normal appearance.  HENT:     Head: Normocephalic and atraumatic.     Right Ear: External ear normal.     Left Ear: External ear normal.  Eyes:     General: No scleral icterus.  Right eye: No discharge.        Left eye: No discharge.     Conjunctiva/sclera: Conjunctivae normal.  Neck:     Thyroid: No thyromegaly.  Cardiovascular:     Rate and Rhythm: Normal rate and regular rhythm.  Pulmonary:     Effort: No respiratory distress.     Breath sounds: Normal breath sounds. No wheezing.  Abdominal:     General: Bowel sounds are normal.     Palpations: Abdomen is soft.     Tenderness: There is no abdominal tenderness.  Musculoskeletal:        General: No swelling or tenderness.     Cervical back: Neck supple. No tenderness.  Lymphadenopathy:     Cervical: No cervical adenopathy.  Skin:    Findings: No erythema or rash.  Neurological:     Mental Status: She is alert.  Psychiatric:        Mood and Affect: Mood normal.        Behavior: Behavior normal.    BP 122/72   Pulse 68   Temp 97.9 F (36.6 C)   Resp 16   Ht 5\' 3"  (1.6 m)   Wt 138 lb (62.6 kg)   SpO2 99%   BMI 24.45 kg/m  Wt Readings from Last 3 Encounters:  04/16/21 138 lb (62.6 kg)  02/26/21 139 lb (63 kg)  12/11/20 139 lb 9.6 oz (63.3 kg)     Lab Results  Component Value Date   WBC 6.9 11/13/2020   HGB 11.7 (L) 11/13/2020   HCT 35.1 (L) 11/13/2020   PLT 229 11/13/2020   GLUCOSE 85 04/16/2021   CHOL 195 04/16/2021   TRIG 143.0 04/16/2021   HDL 50.40 04/16/2021   LDLDIRECT 78.0 04/30/2018   LDLCALC 116 (H) 04/16/2021   ALT 16 04/16/2021   AST 13 04/16/2021   NA 139 04/16/2021   K 4.5 04/16/2021   CL 103 04/16/2021   CREATININE 1.05 04/16/2021    BUN 28 (H) 04/16/2021   CO2 29 04/16/2021   TSH 2.83 04/16/2021    MM 3D SCREEN BREAST BILATERAL  Result Date: 11/01/2019 CLINICAL DATA:  Screening. EXAM: DIGITAL SCREENING BILATERAL MAMMOGRAM WITH TOMO AND CAD COMPARISON:  Previous exam(s). ACR Breast Density Category b: There are scattered areas of fibroglandular density. FINDINGS: There are no findings suspicious for malignancy. Images were processed with CAD. IMPRESSION: No mammographic evidence of malignancy. A result letter of this screening mammogram will be mailed directly to the patient. RECOMMENDATION: Screening mammogram in one year. (Code:SM-B-01Y) BI-RADS CATEGORY  1: Negative. Electronically Signed   By: Margarette Canada M.D.   On: 11/01/2019 13:35       Assessment & Plan:   Problem List Items Addressed This Visit     Environmental allergies    Persistent/chronic symptoms.  Discussed taking antihistamine and using nasal spray.  Follow.        GERD (gastroesophageal reflux disease)    Upper symptoms controlled.  On protonix.        History of hyperparathyroidism    S/p parathyroidectomy.  Follow calcium.        Hypercholesterolemia - Primary    On simvastatin.  Low cholesterol diet and exercise.  Follow lipid panel and liver function tests.         Relevant Orders   Lipid panel (Completed)   Hepatic function panel (Completed)   Basic metabolic panel (Completed)   TSH (Completed)   Iron deficiency anemia    Saw hematology.  Attempted iron infusion.  Had reaction.  They gave her information on dietary adjustments.  Continue multivitamin with iron.  Recent evaluation - hgb improved.  Recommended 6 month f/u.         Left knee pain    Saw ortho.  S/p injection.  Following with ortho.        Memory change    Continue B12 replacements.  Stable.  Follow.        Stress    On zoloft.  Appears to be doing better.  Follow.          Einar Pheasant, MD

## 2021-04-17 LAB — LIPID PANEL
Cholesterol: 195 mg/dL (ref 0–200)
HDL: 50.4 mg/dL (ref >39.00)
LDL Cholesterol: 116 mg/dL — ABNORMAL HIGH (ref 0–99)
NonHDL: 144.64
Total CHOL/HDL Ratio: 4
Triglycerides: 143 mg/dL (ref 0.0–149.0)
VLDL: 28.6 mg/dL (ref 0.0–40.0)

## 2021-04-17 LAB — TSH: TSH: 2.83 u[IU]/mL (ref 0.35–4.50)

## 2021-04-17 LAB — HEPATIC FUNCTION PANEL
ALT: 16 U/L (ref 0–35)
AST: 13 U/L (ref 0–37)
Albumin: 4.2 g/dL (ref 3.5–5.2)
Alkaline Phosphatase: 55 U/L (ref 39–117)
Bilirubin, Direct: 0.1 mg/dL (ref 0.0–0.3)
Total Bilirubin: 0.8 mg/dL (ref 0.2–1.2)
Total Protein: 6 g/dL (ref 6.0–8.3)

## 2021-04-17 LAB — BASIC METABOLIC PANEL
BUN: 28 mg/dL — ABNORMAL HIGH (ref 6–23)
CO2: 29 mEq/L (ref 19–32)
Calcium: 9.6 mg/dL (ref 8.4–10.5)
Chloride: 103 mEq/L (ref 96–112)
Creatinine, Ser: 1.05 mg/dL (ref 0.40–1.20)
GFR: 46.8 mL/min — ABNORMAL LOW (ref >60.00)
Glucose, Bld: 85 mg/dL (ref 70–99)
Potassium: 4.5 mEq/L (ref 3.5–5.1)
Sodium: 139 mEq/L (ref 135–145)

## 2021-04-18 ENCOUNTER — Telehealth: Payer: Self-pay

## 2021-04-18 DIAGNOSIS — E7800 Pure hypercholesterolemia, unspecified: Secondary | ICD-10-CM

## 2021-04-18 NOTE — Addendum Note (Signed)
Addended by: Ezequiel Ganser on: 04/18/2021 03:50 PM   Modules accepted: Orders

## 2021-04-18 NOTE — Telephone Encounter (Signed)
BMET has been ordered for future labs.

## 2021-04-22 ENCOUNTER — Encounter: Payer: Self-pay | Admitting: Internal Medicine

## 2021-04-22 NOTE — Assessment & Plan Note (Signed)
Saw hematology.  Attempted iron infusion.  Had reaction.  They gave her information on dietary adjustments.  Continue multivitamin with iron.  Recent evaluation - hgb improved.  Recommended 6 month f/u.

## 2021-04-22 NOTE — Assessment & Plan Note (Signed)
S/p parathyroidectomy.  Follow calcium.

## 2021-04-22 NOTE — Assessment & Plan Note (Signed)
Continue B12 replacements.  Stable.  Follow.

## 2021-04-22 NOTE — Assessment & Plan Note (Signed)
Saw ortho.  S/p injection.  Following with ortho.

## 2021-04-22 NOTE — Assessment & Plan Note (Signed)
On simvastatin.  Low cholesterol diet and exercise.  Follow lipid panel and liver function tests.   

## 2021-04-22 NOTE — Assessment & Plan Note (Signed)
On zoloft.  Appears to be doing better.  Follow.

## 2021-04-22 NOTE — Assessment & Plan Note (Signed)
Persistent/chronic symptoms.  Discussed taking antihistamine and using nasal spray.  Follow.

## 2021-04-22 NOTE — Assessment & Plan Note (Signed)
Upper symptoms controlled.  On protonix.  

## 2021-04-30 ENCOUNTER — Other Ambulatory Visit: Payer: Self-pay

## 2021-04-30 ENCOUNTER — Ambulatory Visit (INDEPENDENT_AMBULATORY_CARE_PROVIDER_SITE_OTHER): Payer: Medicare Other | Admitting: Internal Medicine

## 2021-04-30 DIAGNOSIS — E538 Deficiency of other specified B group vitamins: Secondary | ICD-10-CM | POA: Diagnosis not present

## 2021-04-30 NOTE — Progress Notes (Addendum)
Patient presented for B 12 injection to right deltoid, patient voiced no concerns nor showed any signs of distress during injection.  Reviewed  Einar Pheasant

## 2021-05-17 ENCOUNTER — Inpatient Hospital Stay (HOSPITAL_BASED_OUTPATIENT_CLINIC_OR_DEPARTMENT_OTHER): Payer: Medicare Other | Admitting: Nurse Practitioner

## 2021-05-17 ENCOUNTER — Inpatient Hospital Stay: Payer: Medicare Other | Attending: Nurse Practitioner

## 2021-05-17 ENCOUNTER — Inpatient Hospital Stay: Payer: Medicare Other

## 2021-05-17 ENCOUNTER — Encounter: Payer: Self-pay | Admitting: Nurse Practitioner

## 2021-05-17 VITALS — BP 129/62 | HR 82 | Temp 99.4°F | Resp 16 | Wt 137.2 lb

## 2021-05-17 DIAGNOSIS — D649 Anemia, unspecified: Secondary | ICD-10-CM | POA: Diagnosis not present

## 2021-05-17 DIAGNOSIS — D509 Iron deficiency anemia, unspecified: Secondary | ICD-10-CM | POA: Diagnosis not present

## 2021-05-17 LAB — CBC WITH DIFFERENTIAL/PLATELET
Abs Immature Granulocytes: 0.04 10*3/uL (ref 0.00–0.07)
Basophils Absolute: 0.1 10*3/uL (ref 0.0–0.1)
Basophils Relative: 1 %
Eosinophils Absolute: 0.1 10*3/uL (ref 0.0–0.5)
Eosinophils Relative: 1 %
HCT: 34.2 % — ABNORMAL LOW (ref 36.0–46.0)
Hemoglobin: 11.4 g/dL — ABNORMAL LOW (ref 12.0–15.0)
Immature Granulocytes: 1 %
Lymphocytes Relative: 26 %
Lymphs Abs: 1.6 10*3/uL (ref 0.7–4.0)
MCH: 30.5 pg (ref 26.0–34.0)
MCHC: 33.3 g/dL (ref 30.0–36.0)
MCV: 91.4 fL (ref 80.0–100.0)
Monocytes Absolute: 0.5 10*3/uL (ref 0.1–1.0)
Monocytes Relative: 8 %
Neutro Abs: 3.9 10*3/uL (ref 1.7–7.7)
Neutrophils Relative %: 63 %
Platelets: 270 10*3/uL (ref 150–400)
RBC: 3.74 MIL/uL — ABNORMAL LOW (ref 3.87–5.11)
RDW: 13.9 % (ref 11.5–15.5)
WBC: 6.2 10*3/uL (ref 4.0–10.5)
nRBC: 0 % (ref 0.0–0.2)

## 2021-05-17 LAB — IRON AND TIBC
Iron: 57 ug/dL (ref 28–170)
Saturation Ratios: 20 % (ref 10.4–31.8)
TIBC: 280 ug/dL (ref 250–450)
UIBC: 223 ug/dL

## 2021-05-17 LAB — FERRITIN: Ferritin: 64 ng/mL (ref 11–307)

## 2021-05-17 NOTE — Progress Notes (Signed)
[place]  Telephone:([phone] Fax:(336) [phone]  ID: Sabrina Cervantes OB: June 11, 1929  MR#: [id number]  LKT#:[id number]  Patient Care Team: Einar Pheasant, MD as PCP - General (Internal Medicine)  CHIEF COMPLAINT: Anemia, unspecified.  INTERVAL HISTORY: Patient returns to clinic for further evaluation and consideration of IV iron.  She has had left knee pain and recently received cortisone.  Some fatigue but generally feels well.  Has been eating better.  No neurologic complaints.  No recent fevers or illness.  No chest pain, shortness of breath, cough, hemoptysis.  No vaginal bleeding.  No melena or hematochezia.  No nausea, vomiting, constipation, diarrhea.  No urinary complaints.  No further specific complaints today.  Patient's daughter who accompanies her today contributes to history given patient's personal history of memory loss.  REVIEW OF SYSTEMS:   Review of Systems  Constitutional:  Positive for malaise/fatigue. Negative for fever and weight loss.  Respiratory: Negative.  Negative for cough, hemoptysis and shortness of breath.   Cardiovascular: Negative.  Negative for chest pain and leg swelling.  Gastrointestinal: Negative.  Negative for abdominal pain, blood in stool and melena.  Genitourinary: Negative.  Negative for hematuria.  Musculoskeletal:  Positive for joint pain. Negative for back pain.  Skin: Negative.  Negative for rash.  Neurological: Negative.  Negative for dizziness, focal weakness, weakness and headaches.  Psychiatric/Behavioral:  Positive for memory loss. Negative for depression. The patient is not nervous/anxious.   As per HPI. Otherwise, a complete review of systems is negative.  PAST MEDICAL HISTORY: Past Medical History:  Diagnosis Date   Anemia    Anxiety    Arthritis    "left hip" (08/15/2016)   Basal cell carcinoma    forehead & nose; [facility]; deep cutting   Depression    GERD (gastroesophageal reflux  disease)    Hepatitis    "think I had it as a child"   History of kidney stones    HOH (hard of hearing)    Hypercholesterolemia    Osteoporosis    Pyelonephritis, acute     s/p ureteral stent   Vasomotor rhinitis    chronic    PAST SURGICAL HISTORY: Past Surgical History:  Procedure Laterality Date   ABDOMINAL HYSTERECTOMY  1968   secondary to prolapse, ovaries not removed   BASAL CELL CARCINOMA EXCISION     forehead & nose; [place] cut them off; deep cutting   CATARACT EXTRACTION, BILATERAL Bilateral    COLONOSCOPY     DILATION AND CURETTAGE OF UTERUS     S/P miscarriage   LITHOTRIPSY     Dr Yves Dill, nephrolithiasis   PARATHYROIDECTOMY Left 08/15/2016    inferior minimally invasive /notes 08/15/2016   PARATHYROIDECTOMY Left 08/15/2016   Procedure: LEFT INFERIOR PARATHYROIDECTOMY;  Surgeon: Armandina Gemma, MD;  Location: [facility];  Service: General;  Laterality: Left;   RETINAL DETACHMENT SURGERY     URETERAL STENT PLACEMENT      FAMILY HISTORY: Family History  Problem Relation Age of Onset   Skin cancer Father    Prostate cancer Father    Colon cancer Brother    Prostate cancer Brother    Hypertension Sister    Skin cancer Sister    Diabetes Mellitus II Sister    Breast cancer Sister 40   Breast cancer Other        niece    ADVANCED DIRECTIVES (Y/N):  N  HEALTH MAINTENANCE: Social History   Tobacco Use  Smoking status: Never   Smokeless tobacco: Never  Substance Use Topics   Alcohol use: No    Alcohol/week: 0.0 standard drinks   Drug use: No     Colonoscopy:  PAP:  Bone density:  Lipid panel:  Allergies  Allergen Reactions   Pyridium [Phenazopyridine Hcl] Itching and Rash    Current Outpatient Medications  Medication Sig Dispense Refill   Calcium Carbonate-Vitamin D 600-400 MG-UNIT tablet Take 1 tablet by mouth 2 (two) times daily.     Cholecalciferol (VITAMIN D-3) 1000 UNITS CAPS Take 1,000 Units by mouth daily.      CRANBERRY PO Take 1  tablet by mouth daily.     fish oil-omega-3 fatty acids 1000 MG capsule Take 1 g by mouth daily.     ipratropium (ATROVENT) 0.03 % nasal spray Place 1 spray into both nostrils every 12 (twelve) hours. 30 mL 1   Multiple Vitamins-Iron (MULTIVITAMINS WITH IRON) TABS tablet Take 1 tablet by mouth daily.     mupirocin ointment (BACTROBAN) 2 % Apply to affected area bid 22 g 0   OVER THE COUNTER MEDICATION Place 1 drop into both eyes daily as needed (for dry eyes).     simvastatin (ZOCOR) 40 MG tablet Take 1 tablet (40 mg total) by mouth every evening. 90 tablet 3   Current Facility-Administered Medications  Medication Dose Route Frequency Provider Last Rate Last Admin   cyanocobalamin ((VITAMIN B-12)) injection 1,000 mcg  1,000 mcg Intramuscular Q30 days Einar Pheasant, MD   1,000 mcg at 04/30/21 1338    OBJECTIVE: Vitals:   05/17/21 1408  BP: 129/62  Pulse: 82  Resp: 16  Temp: 99.4 F (37.4 C)     Body mass index is 24.3 kg/m.    ECOG FS:0 - Asymptomatic  General: elderly female. Accompanied.  Eyes: Pink conjunctiva, anicteric sclera. Lungs: Clear to auscultation bilaterally.  No audible wheezing or coughing Heart: Regular rate and rhythm.  Abdomen: Soft, nontender, nondistended.  Musculoskeletal: No edema, cyanosis, or clubbing. Ambulatory w/o aids. Neuro: memory loss.  Skin: No rashes or petechiae noted. Psych: Normal affect.   LAB RESULTS:  Lab Results  Component Value Date   NA 139 04/16/2021   K 4.5 04/16/2021   CL 103 04/16/2021   CO2 29 04/16/2021   GLUCOSE 85 04/16/2021   BUN 28 (H) 04/16/2021   CREATININE 1.05 04/16/2021   CALCIUM 9.6 04/16/2021   PROT 6.0 04/16/2021   ALBUMIN 4.2 04/16/2021   AST 13 04/16/2021   ALT 16 04/16/2021   ALKPHOS 55 04/16/2021   BILITOT 0.8 04/16/2021   GFRNONAA >60 08/16/2016   GFRAA >60 08/16/2016    Lab Results  Component Value Date   WBC 6.2 05/17/2021   NEUTROABS 3.9 05/17/2021   HGB 11.4 (L) 05/17/2021   HCT 34.2  (L) 05/17/2021   MCV 91.4 05/17/2021   PLT 270 05/17/2021   Lab Results  Component Value Date   IRON 67 11/13/2020   TIBC 316 11/13/2020   IRONPCTSAT 21 11/13/2020   Lab Results  Component Value Date   FERRITIN 37 11/13/2020    STUDIES: No results found.  ASSESSMENT: Anemia, unspecified.  PLAN:    1. Anemia- etiology likely related to poor oral intake previously. Now resolved.  She previously saved 1 dose of IV Feraheme August 07, 2020 with reaction.  Has not been rechallenged.  She receives monthly B12 injections with her PCP.  Takes oral multivitamin with iron.  Labs today were reviewed which shows improvement  in her hemoglobin.  Ferritin and iron studies are currently pending.  No IV iron today.  Return to clinic in 6 months for labs (CBC, ferritin, iron studies), MD assessment, and possible iron.  Given her previous reaction to [facility] if she was to require additional IV iron may consider premedications or change in formulation.  Patient expressed understanding and was in agreement with this plan. She also understands that She can call clinic at any time with any questions, concerns, or complaints.    Verlon Au, NP   05/17/2021 2:42 PM

## 2021-05-17 NOTE — Progress Notes (Signed)
Patient denies new problems/concerns today.   °

## 2021-05-30 ENCOUNTER — Ambulatory Visit: Payer: Medicare Other

## 2021-05-31 ENCOUNTER — Ambulatory Visit (INDEPENDENT_AMBULATORY_CARE_PROVIDER_SITE_OTHER): Payer: Medicare Other

## 2021-05-31 ENCOUNTER — Other Ambulatory Visit: Payer: Self-pay

## 2021-05-31 DIAGNOSIS — E7800 Pure hypercholesterolemia, unspecified: Secondary | ICD-10-CM | POA: Diagnosis not present

## 2021-05-31 DIAGNOSIS — E538 Deficiency of other specified B group vitamins: Secondary | ICD-10-CM | POA: Diagnosis not present

## 2021-05-31 LAB — BASIC METABOLIC PANEL
BUN: 19 mg/dL (ref 6–23)
CO2: 26 mEq/L (ref 19–32)
Calcium: 9.7 mg/dL (ref 8.4–10.5)
Chloride: 103 mEq/L (ref 96–112)
Creatinine, Ser: 0.89 mg/dL (ref 0.40–1.20)
GFR: 57.02 mL/min — ABNORMAL LOW (ref >60.00)
Glucose, Bld: 97 mg/dL (ref 70–99)
Potassium: 3.8 mEq/L (ref 3.5–5.1)
Sodium: 138 mEq/L (ref 135–145)

## 2021-05-31 MED ORDER — CYANOCOBALAMIN 1000 MCG/ML IJ SOLN
1000.0000 ug | Freq: Once | INTRAMUSCULAR | Status: AC
  Administered 2021-05-31: 1000 ug via INTRAMUSCULAR

## 2021-05-31 NOTE — Progress Notes (Signed)
Patient presented for B 12 injection to left deltoid, patient voiced no concerns nor showed any signs of distress during injection. 

## 2021-06-15 ENCOUNTER — Emergency Department
Admission: EM | Admit: 2021-06-15 | Discharge: 2021-06-16 | Disposition: A | Discharge status: Home / Self Care | Payer: Medicare Other | Attending: Emergency Medicine | Admitting: Emergency Medicine

## 2021-06-15 ENCOUNTER — Other Ambulatory Visit: Payer: Self-pay

## 2021-06-15 ENCOUNTER — Encounter: Payer: Self-pay | Admitting: Emergency Medicine

## 2021-06-15 DIAGNOSIS — R531 Weakness: Secondary | ICD-10-CM | POA: Diagnosis not present

## 2021-06-15 DIAGNOSIS — Z85828 Personal history of other malignant neoplasm of skin: Secondary | ICD-10-CM | POA: Diagnosis not present

## 2021-06-15 DIAGNOSIS — U071 COVID-19: Secondary | ICD-10-CM | POA: Diagnosis not present

## 2021-06-15 DIAGNOSIS — N390 Urinary tract infection, site not specified: Secondary | ICD-10-CM | POA: Diagnosis not present

## 2021-06-15 LAB — BASIC METABOLIC PANEL
Anion gap: 7 (ref 5–15)
BUN: 16 mg/dL (ref 8–23)
CO2: 27 mmol/L (ref 22–32)
Calcium: 9.1 mg/dL (ref 8.9–10.3)
Chloride: 102 mmol/L (ref 98–111)
Creatinine, Ser: 0.82 mg/dL (ref 0.44–1.00)
GFR, Estimated: >60 mL/min (ref >60)
Glucose, Bld: 123 mg/dL — ABNORMAL HIGH (ref 70–99)
Potassium: 3.5 mmol/L (ref 3.5–5.1)
Sodium: 136 mmol/L (ref 135–145)

## 2021-06-15 LAB — CBC
HCT: 34.1 % — ABNORMAL LOW (ref 36.0–46.0)
Hemoglobin: 11.4 g/dL — ABNORMAL LOW (ref 12.0–15.0)
MCH: 31.3 pg (ref 26.0–34.0)
MCHC: 33.4 g/dL (ref 30.0–36.0)
MCV: 93.7 fL (ref 80.0–100.0)
Platelets: 208 10*3/uL (ref 150–400)
RBC: 3.64 MIL/uL — ABNORMAL LOW (ref 3.87–5.11)
RDW: 13.8 % (ref 11.5–15.5)
WBC: 7.2 10*3/uL (ref 4.0–10.5)
nRBC: 0 % (ref 0.0–0.2)

## 2021-06-15 NOTE — ED Provider Notes (Signed)
[facility] Emergency Department Provider Note  ____________________________________________   Event Date/Time   First MD Initiated Contact with Patient 06/15/21 2344     (approximate)  I have reviewed the triage vital signs and the nursing notes.   HISTORY  Chief Complaint Near Syncope    HPI JAELEN GOVERT is a 87 y.o. female with history of hyperlipidemia, GERD, depression who presents to the emergency department with an episode of feeling weak today after she was at the hair salon around 5 PM.  States that she came home and felt nauseated and had 1 episode of nonbloody, nonbilious vomiting.  No diarrhea.  Denies any chest pain or shortness of breath.  Feels better now and has no symptoms.  No fevers, dysuria, abdominal pain.  No numbness, focal weakness.  Family reports she has a history of "memory issues".  Patient intermittently complains of congestion and sore throat but family member at bedside states that this has been an ongoing complaint for months.     Past Medical History:  Diagnosis Date   Anemia    Anxiety    Arthritis    "left hip" (08/15/2016)   Basal cell carcinoma    forehead & nose; [facility] cut them off; deep cutting   Depression    GERD (gastroesophageal reflux disease)    Hepatitis    "think I had it as a child"   History of kidney stones    HOH (hard of hearing)    Hypercholesterolemia    Osteoporosis    Pyelonephritis, acute     s/p ureteral stent   Vasomotor rhinitis    chronic    Patient Active Problem List   Diagnosis Date Noted   Urinary frequency 06/25/2020   Sleep difficulties 06/25/2020   Memory change 06/13/2020   Hypokalemia 01/31/2020   Iron deficiency anemia 10/23/2019   Left knee pain 06/15/2019   Left hip pain 05/04/2018   History of hyperparathyroidism 06/25/2016   Vitamin D deficiency 02/17/2016   Health care maintenance 04/17/2015   Dizziness 10/02/2014   Sinusitis 10/02/2014   Environmental  allergies 07/31/2014   Stress 07/31/2014   Constipation 01/23/2014   Leg skin lesion, left 07/21/2013   Osteoporosis 09/29/2012   GERD (gastroesophageal reflux disease) 09/29/2012   Hypercholesterolemia 09/29/2012    Past Surgical History:  Procedure Laterality Date   ABDOMINAL HYSTERECTOMY  1968   secondary to prolapse, ovaries not removed   BASAL CELL CARCINOMA EXCISION     forehead & nose; [place] cut them off; deep cutting   CATARACT EXTRACTION, BILATERAL Bilateral    COLONOSCOPY     DILATION AND CURETTAGE OF UTERUS     S/P miscarriage   LITHOTRIPSY     Dr Yves Dill, nephrolithiasis   PARATHYROIDECTOMY Left 08/15/2016    inferior minimally invasive /notes 08/15/2016   PARATHYROIDECTOMY Left 08/15/2016   Procedure: LEFT INFERIOR PARATHYROIDECTOMY;  Surgeon: Armandina Gemma, MD;  Location: [place];  Service: General;  Laterality: Left;   RETINAL DETACHMENT SURGERY     URETERAL STENT PLACEMENT      Prior to Admission medications   Medication Sig Start Date End Date Taking? Authorizing Provider  cephALEXin (KEFLEX) 500 MG capsule Take 1 capsule (500 mg total) by mouth 2 (two) times daily. 06/16/21  Yes Nazair Fortenberry, Delice Bison, DO  nirmatrelvir/ritonavir EUA (PAXLOVID) TABS Take 3 tablets by mouth 2 (two) times daily for 5 days. Patient GFR is 60. Take nirmatrelvir (150 mg) two tablets twice daily for 5  days and ritonavir (100 mg) one tablet twice daily for 5 days. 06/16/21 06/21/21 Yes Yves Fodor, Cyril Mourning N, DO  ondansetron (ZOFRAN ODT) 4 MG disintegrating tablet Take 1 tablet (4 mg total) by mouth every 6 (six) hours as needed for nausea or vomiting. 06/16/21  Yes Hasel Janish, Delice Bison, DO  Calcium Carbonate-Vitamin D 600-400 MG-UNIT tablet Take 1 tablet by mouth 2 (two) times daily.    [provider]  Cholecalciferol (VITAMIN D-3) 1000 UNITS CAPS Take 1,000 Units by mouth daily.     [provider]  CRANBERRY PO Take 1 tablet by mouth daily.    [provider]  fish oil-omega-3  fatty acids 1000 MG capsule Take 1 g by mouth daily.    [provider]  ipratropium (ATROVENT) 0.03 % nasal spray Place 1 spray into both nostrils every 12 (twelve) hours. 01/27/20   Einar Pheasant, MD  Multiple Vitamins-Iron (MULTIVITAMINS WITH IRON) TABS tablet Take 1 tablet by mouth daily.    [provider]  mupirocin ointment (BACTROBAN) 2 % Apply to affected area bid 05/04/18   Einar Pheasant, MD  OVER THE COUNTER MEDICATION Place 1 drop into both eyes daily as needed (for dry eyes).    [provider]    Allergies Pyridium [phenazopyridine hcl]  Family History  Problem Relation Age of Onset   Skin cancer Father    Prostate cancer Father    Colon cancer Brother    Prostate cancer Brother    Hypertension Sister    Skin cancer Sister    Diabetes Mellitus II Sister    Breast cancer Sister 13   Breast cancer Other        niece    Social History Social History   Tobacco Use   Smoking status: Never   Smokeless tobacco: Never  Substance Use Topics   Alcohol use: No    Alcohol/week: 0.0 standard drinks   Drug use: No    Review of Systems Constitutional: No fever. Eyes: No visual changes. ENT: No sore throat. Cardiovascular: Denies chest pain. Respiratory: Denies shortness of breath. Gastrointestinal: + nausea, vomiting.  No diarrhea. Genitourinary: Negative for dysuria. Musculoskeletal: Negative for back pain. Skin: Negative for rash. Neurological: Negative for focal weakness or numbness.  ____________________________________________   PHYSICAL EXAM:  VITAL SIGNS: ED Triage Vitals  Enc Vitals Group     BP 06/15/21 1839 129/63     Pulse Rate 06/15/21 1839 75     Resp 06/15/21 1839 16     Temp 06/15/21 1839 100.1 F (37.8 C)     Temp Source 06/15/21 1839 Oral     SpO2 06/15/21 1839 97 %     Weight --      Height 06/15/21 1836 '5\' 3"'$  (1.6 m)     Head Circumference --      Peak Flow --      Pain Score 06/15/21 1836 0     Pain Loc  --      Pain Edu? --      Excl. in [place]? --    CONSTITUTIONAL: Alert and oriented and responds appropriately to questions. Well-appearing; well-nourished, elderly HEAD: Normocephalic EYES: Conjunctivae clear, pupils appear equal, EOM appear intact ENT: normal nose; moist mucous membranes NECK: Supple, normal ROM CARD: RRR; S1 and S2 appreciated; no murmurs, no clicks, no rubs, no gallops RESP: Normal chest excursion without splinting or tachypnea; breath sounds clear and equal bilaterally; no wheezes, no rhonchi, no rales, no hypoxia or respiratory distress, speaking full  sentences ABD/GI: Normal bowel sounds; non-distended; soft, non-tender, no rebound, no guarding, no peritoneal signs, no hepatosplenomegaly BACK: The back appears normal EXT: Normal ROM in all joints; no deformity noted, no edema; no cyanosis SKIN: Normal color for age and race; warm; no rash on exposed skin NEURO: Moves all extremities equally, normal speech, no facial asymmetry PSYCH: The patient's mood and manner are appropriate.  ____________________________________________   LABS (all labs ordered are listed, but only abnormal results are displayed)  Labs Reviewed  RESP PANEL BY RT-PCR (FLU A&B, COVID) ARPGX2 - Abnormal; Notable for the following components:      Result Value   SARS Coronavirus 2 by RT PCR POSITIVE (*)    All other components within normal limits  BASIC METABOLIC PANEL - Abnormal; Notable for the following components:   Glucose, Bld 123 (*)    All other components within normal limits  CBC - Abnormal; Notable for the following components:   RBC 3.64 (*)    Hemoglobin 11.4 (*)    HCT 34.1 (*)    All other components within normal limits  URINALYSIS, COMPLETE (UACMP) WITH MICROSCOPIC - Abnormal; Notable for the following components:   Color, Urine YELLOW (*)    APPearance CLOUDY (*)    Hgb urine dipstick SMALL (*)    Nitrite POSITIVE (*)    Leukocytes,Ua LARGE (*)    WBC, UA >50 (*)     Bacteria, UA MANY (*)    All other components within normal limits  URINE CULTURE  TROPONIN I (HIGH SENSITIVITY)   ____________________________________________  EKG   EKG Interpretation  Date/Time:  Friday June 15 2021 18:46:11 EDT Ventricular Rate:  77 PR Interval:  166 QRS Duration: 84 QT Interval:  396 QTC Calculation: 448 R Axis:   -17 Text Interpretation: Normal sinus rhythm Inferior infarct , age undetermined Abnormal ECG Confirmed by Pryor Curia [id number]) on 06/15/2021 11:52:37 PM        ____________________________________________  RADIOLOGY Jessie Foot Raetta Agostinelli, personally viewed and evaluated these images (plain radiographs) as part of my medical decision making, as well as reviewing the written report by the radiologist.  ED MD interpretation:    Official radiology report(s): No results found.  ____________________________________________   PROCEDURES  Procedure(s) performed (including Critical Care):  Procedures   ____________________________________________   INITIAL IMPRESSION / ASSESSMENT AND PLAN / ED COURSE  As part of my medical decision making, I reviewed the following data within the [place] History obtained from family, Nursing notes reviewed and incorporated, Labs reviewed , Old chart reviewed, and Notes from prior ED visits       Patient here with episode of generalized weakness, nausea and vomiting that has resolved.  States she has no symptoms currently and is well-appearing.  Initial vitals did show a temperature of 100.1.  She has no infectious symptoms currently.  She denies chest pain, shortness of breath, cough.  Abdominal exam benign.  No complaints of abdominal pain.  Will obtain urinalysis to evaluate for UTI.  We will also check troponin.  Labs reassuring.  Normal white blood cell count, hemoglobin, electrolytes, renal function.  ED PROGRESS  Patient's urine does appear infected.  We will add on urine culture.   Previous urine culture in 2021 grew strep agalactiae.  Will give Rocephin here.  We will also obtain COVID and flu swabs.  Patient's repeat oral temperature 99.7.  Family states that "she does not go anywhere" however was at the hair salon today and they  say that she goes there every 2 weeks.  Have recommended COVID testing and they agree.  She denies any cough or shortness of breath.  Able to ambulate here without difficulty.  2:50 AM  Pt's COVID-19 test is positive.  She has no shortness of breath, increased work of breathing, hypoxia.  Tolerating p.o. here.  Have discussed given patient's age, COVID-66 positivity and UTI that we could admit her to the hospital for further monitoring, IV antibiotics, remdesivir however family would like to go home.  I also feel this is very reasonable.  They have close outpatient follow-up.  Will discharge with prescription for Keflex as well as paxlovid  I recommended that she hold her simvastatin while on antivirals and for 3 days afterwards.  They verbalized understanding.  Discussed return precautions.  Will discharge home.  At this time, I do not feel there is any life-threatening condition present. I have reviewed, interpreted and discussed all results (EKG, imaging, lab, urine as appropriate) and exam findings with patient/family. I have reviewed nursing notes and appropriate previous records.  I feel the patient is safe to be discharged home without further emergent workup and can continue workup as an outpatient as needed. Discussed usual and customary return precautions. Patient/family verbalize understanding and are comfortable with this plan.  Outpatient follow-up has been provided as needed. All questions have been answered.  ____________________________________________   FINAL CLINICAL IMPRESSION(S) / ED DIAGNOSES  Final diagnoses:  Acute UTI  Generalized weakness  COVID-19     ED Discharge Orders          Ordered    cephALEXin (KEFLEX) 500 MG  capsule  2 times daily        06/16/21 0254    nirmatrelvir/ritonavir EUA (PAXLOVID) TABS  2 times daily        06/16/21 0254    ondansetron (ZOFRAN ODT) 4 MG disintegrating tablet  Every 6 hours PRN        06/16/21 0254            *Please note:  TAHRA HOVE was evaluated in Emergency Department on 06/16/2021 for the symptoms described in the history of present illness. She was evaluated in the context of the global COVID-19 pandemic, which necessitated consideration that the patient might be at risk for infection with the SARS-CoV-2 virus that causes COVID-19. Institutional protocols and algorithms that pertain to the evaluation of patients at risk for COVID-19 are in a state of rapid change based on information released by regulatory bodies including the CDC and federal and state organizations. These policies and algorithms were followed during the patient's care in the ED.  Some ED evaluations and interventions may be delayed as a result of limited staffing during and the pandemic.*   Note:  This document was prepared using Dragon voice recognition software and may include unintentional dictation errors.    Yuvia Plant, Delice Bison, DO 06/16/21 [phone]

## 2021-06-15 NOTE — ED Triage Notes (Signed)
Pt to ED via POV with Son who states that pt went to the hair salon and when she got home she was not feeling well and had a near syncopal episode. Pt is having nausea and dry heaving. Pt states she is not in any pain. Pt is in NAD.

## 2021-06-16 DIAGNOSIS — U071 COVID-19: Secondary | ICD-10-CM | POA: Diagnosis not present

## 2021-06-16 LAB — URINALYSIS, COMPLETE (UACMP) WITH MICROSCOPIC
Bilirubin Urine: NEGATIVE
Glucose, UA: NEGATIVE mg/dL
Ketones, ur: NEGATIVE mg/dL
Nitrite: POSITIVE — AB
Protein, ur: NEGATIVE mg/dL
Specific Gravity, Urine: 1.01 (ref 1.005–1.030)
WBC, UA: >50 WBC/hpf — ABNORMAL HIGH (ref 0–5)
pH: 6 (ref 5.0–8.0)

## 2021-06-16 LAB — RESP PANEL BY RT-PCR (FLU A&B, COVID) ARPGX2
Influenza A by PCR: NEGATIVE
Influenza B by PCR: NEGATIVE
SARS Coronavirus 2 by RT PCR: POSITIVE — AB

## 2021-06-16 LAB — TROPONIN I (HIGH SENSITIVITY): Troponin I (High Sensitivity): 4 ng/L (ref <18)

## 2021-06-16 MED ORDER — NIRMATRELVIR/RITONAVIR (PAXLOVID)TABLET: 3.0000 | ORAL_TABLET | Freq: Two times a day (BID) | ORAL | 0 refills | Status: AC

## 2021-06-16 MED ORDER — ACETAMINOPHEN 500 MG PO TABS
1000.0000 mg | ORAL_TABLET | Freq: Once | ORAL | Status: AC
  Administered 2021-06-16: 1000 mg via ORAL
  Filled 2021-06-16: qty 2

## 2021-06-16 MED ORDER — ONDANSETRON 4 MG PO TBDP
4.0000 mg | ORAL_TABLET | Freq: Four times a day (QID) | ORAL | 0 refills | Status: DC | PRN
Start: 2021-06-16 — End: 2021-07-30

## 2021-06-16 MED ORDER — SODIUM CHLORIDE 0.9 % IV SOLN
1.0000 g | Freq: Once | INTRAVENOUS | Status: AC
  Administered 2021-06-16: 1 g via INTRAVENOUS
  Filled 2021-06-16: qty 10

## 2021-06-16 MED ORDER — CEPHALEXIN 500 MG PO CAPS: 500.0000 mg | ORAL_CAPSULE | Freq: Two times a day (BID) | ORAL | 0 refills | Status: DC

## 2021-06-16 NOTE — Discharge Instructions (Signed)
You may take over-the-counter Tylenol 1000 mg every 6 hours as needed for fever, pain.  Your COVID test today was positive.  You will need to quarantine for at least 5 days after the onset of symptoms.  You can come out of quarantine and wear a mask for the next 5 days afterwards if you are feeling better and have not had a fever in 24 hours.  We have prescribed Paxlovid which is a medication that can help prevent hospitalization, serious complications and death with COVID-19.  We recommend that you stop taking your simvastatin (zocor) while taking this medication and hold your simvastatin for 3 days after finishing this medication.  You also have a urinary tract infection.  We are discharging you home on antibiotics.  Please take these until complete.  Please follow-up with your doctor in 2 weeks to have your urine rechecked.

## 2021-06-18 LAB — URINE CULTURE: Culture: >=100000 — AB

## 2021-06-20 ENCOUNTER — Telehealth: Payer: Self-pay | Admitting: Internal Medicine

## 2021-06-20 ENCOUNTER — Other Ambulatory Visit: Payer: Self-pay | Admitting: *Deleted

## 2021-06-20 DIAGNOSIS — N390 Urinary tract infection, site not specified: Secondary | ICD-10-CM

## 2021-06-20 NOTE — Telephone Encounter (Signed)
Patient is getting over a UTI and is taking medication for it. Patient's daughter called and wanted to know if she could have her urine checked on 07/02/21 when she comes in for her B12 inj.

## 2021-06-20 NOTE — Telephone Encounter (Signed)
Ok to recheck urine to confirm clear.  Let me know if any problems.

## 2021-06-20 NOTE — Telephone Encounter (Signed)
See ED note, if okay, I will place order & notify patient

## 2021-06-20 NOTE — Telephone Encounter (Signed)
Left voicemail to notify sister & orders placed

## 2021-07-02 ENCOUNTER — Other Ambulatory Visit: Payer: Self-pay

## 2021-07-02 ENCOUNTER — Ambulatory Visit (INDEPENDENT_AMBULATORY_CARE_PROVIDER_SITE_OTHER): Payer: Medicare Other

## 2021-07-02 DIAGNOSIS — N390 Urinary tract infection, site not specified: Secondary | ICD-10-CM

## 2021-07-02 DIAGNOSIS — E538 Deficiency of other specified B group vitamins: Secondary | ICD-10-CM

## 2021-07-02 MED ORDER — CYANOCOBALAMIN 1000 MCG/ML IJ SOLN: 1000.0000 ug | Freq: Once | INTRAMUSCULAR | Status: DC

## 2021-07-02 NOTE — Progress Notes (Signed)
Patient presented for B 12 injection to left deltoid, patient voiced no concerns nor showed any signs of distress during injection. 

## 2021-07-03 ENCOUNTER — Other Ambulatory Visit: Payer: Medicare Other

## 2021-07-03 DIAGNOSIS — N390 Urinary tract infection, site not specified: Secondary | ICD-10-CM | POA: Diagnosis not present

## 2021-07-03 LAB — URINALYSIS, ROUTINE W REFLEX MICROSCOPIC
Bilirubin Urine: NEGATIVE
Ketones, ur: NEGATIVE
Nitrite: POSITIVE — AB
Specific Gravity, Urine: 1.01 (ref 1.000–1.030)
Total Protein, Urine: NEGATIVE
Urine Glucose: NEGATIVE
Urobilinogen, UA: 0.2 (ref 0.0–1.0)
pH: 6.5 (ref 5.0–8.0)

## 2021-07-03 NOTE — Addendum Note (Signed)
Addended by: Leeanne Rio on: 07/03/2021 09:20 AM   Modules accepted: Orders

## 2021-07-05 LAB — URINE CULTURE
MICRO NUMBER:: 12309754
SPECIMEN QUALITY:: ADEQUATE

## 2021-07-06 ENCOUNTER — Telehealth: Payer: Self-pay

## 2021-07-06 MED ORDER — CEFDINIR 300 MG PO CAPS: 300.0000 mg | ORAL_CAPSULE | Freq: Two times a day (BID) | ORAL | 0 refills | Status: DC

## 2021-07-06 NOTE — Telephone Encounter (Signed)
Omnicef 300 mg bid for 5 days sent into pt pharmacy

## 2021-07-16 DIAGNOSIS — H02055 Trichiasis without entropian left lower eyelid: Secondary | ICD-10-CM | POA: Diagnosis not present

## 2021-07-16 DIAGNOSIS — H02052 Trichiasis without entropian right lower eyelid: Secondary | ICD-10-CM | POA: Diagnosis not present

## 2021-07-16 DIAGNOSIS — H353132 Nonexudative age-related macular degeneration, bilateral, intermediate dry stage: Secondary | ICD-10-CM | POA: Diagnosis not present

## 2021-07-30 ENCOUNTER — Ambulatory Visit (INDEPENDENT_AMBULATORY_CARE_PROVIDER_SITE_OTHER): Payer: Medicare Other | Admitting: Internal Medicine

## 2021-07-30 ENCOUNTER — Other Ambulatory Visit: Payer: Self-pay

## 2021-07-30 VITALS — BP 122/70 | HR 73 | Temp 97.6°F | Resp 16 | Ht 63.0 in | Wt 133.0 lb

## 2021-07-30 DIAGNOSIS — R413 Other amnesia: Secondary | ICD-10-CM

## 2021-07-30 DIAGNOSIS — E7800 Pure hypercholesterolemia, unspecified: Secondary | ICD-10-CM

## 2021-07-30 DIAGNOSIS — D500 Iron deficiency anemia secondary to blood loss (chronic): Secondary | ICD-10-CM

## 2021-07-30 DIAGNOSIS — E559 Vitamin D deficiency, unspecified: Secondary | ICD-10-CM

## 2021-07-30 DIAGNOSIS — G8929 Other chronic pain: Secondary | ICD-10-CM | POA: Diagnosis not present

## 2021-07-30 DIAGNOSIS — Z23 Encounter for immunization: Secondary | ICD-10-CM

## 2021-07-30 DIAGNOSIS — Z8639 Personal history of other endocrine, nutritional and metabolic disease: Secondary | ICD-10-CM | POA: Diagnosis not present

## 2021-07-30 DIAGNOSIS — M25562 Pain in left knee: Secondary | ICD-10-CM | POA: Diagnosis not present

## 2021-07-30 DIAGNOSIS — Z8616 Personal history of COVID-19: Secondary | ICD-10-CM | POA: Diagnosis not present

## 2021-07-30 DIAGNOSIS — F439 Reaction to severe stress, unspecified: Secondary | ICD-10-CM | POA: Diagnosis not present

## 2021-07-30 DIAGNOSIS — M810 Age-related osteoporosis without current pathological fracture: Secondary | ICD-10-CM | POA: Diagnosis not present

## 2021-07-30 DIAGNOSIS — K219 Gastro-esophageal reflux disease without esophagitis: Secondary | ICD-10-CM

## 2021-07-30 MED ORDER — SIMVASTATIN 40 MG PO TABS: 40.0000 mg | ORAL_TABLET | Freq: Every evening | ORAL | 3 refills | Status: DC

## 2021-07-30 NOTE — Progress Notes (Signed)
Patient ID: Sabrina Cervantes, female   DOB: [birth year], 87 y.o.   MRN: [id number]   Subjective:    Patient ID: Sabrina Cervantes, female    DOB: Apr 19, 1929, 87 y.o.   MRN: [id number]  This visit occurred during the SARS-CoV-2 public health emergency.  Safety protocols were in place, including screening questions prior to the visit, additional usage of staff PPE, and extensive cleaning of exam room while observing appropriate contact time as indicated for disinfecting solutions.   Patient here for scheduled follow up.   Chief Complaint  Patient presents with   Knee Pain   Hyperlipidemia   .   HPI Here to follow up regarding increased cholesterol.  Recent covid.  She is accompanied by her daughter.  History obtained from both of them.  Evaluated in ER 06/15/21 - weak/felt like going to pass out, etc.  Diagnosed with covid and UTI.  Treated with oral antiviral and keflex.  Feeling better now.  No residual problems reported from covid.  Had f/u urine checked - UTI.  Treated with omnicef.  Seeing ortho for her knee.  Did not see improvement with injection.  Discussed f/u.  No chest pain.  Breathing overall stable.  Eating.  No vomiting.  Bowels moving.     Past Medical History:  Diagnosis Date   Anemia    Anxiety    Arthritis    "left hip" (08/15/2016)   Basal cell carcinoma    forehead & nose; [facility]; deep cutting   Depression    GERD (gastroesophageal reflux disease)    Hepatitis    "think I had it as a child"   History of kidney stones    HOH (hard of hearing)    Hypercholesterolemia    Osteoporosis    Pyelonephritis, acute     s/p ureteral stent   Vasomotor rhinitis    chronic   Past Surgical History:  Procedure Laterality Date   ABDOMINAL HYSTERECTOMY  1968   secondary to prolapse, ovaries not removed   BASAL CELL CARCINOMA EXCISION     forehead & nose; [place] cut them off; deep cutting   CATARACT EXTRACTION, BILATERAL Bilateral    COLONOSCOPY     DILATION AND  CURETTAGE OF UTERUS     S/P miscarriage   LITHOTRIPSY     Dr Yves Dill, nephrolithiasis   PARATHYROIDECTOMY Left 08/15/2016    inferior minimally invasive /notes 08/15/2016   PARATHYROIDECTOMY Left 08/15/2016   Procedure: LEFT INFERIOR PARATHYROIDECTOMY;  Surgeon: Armandina Gemma, MD;  Location: [place];  Service: General;  Laterality: Left;   RETINAL DETACHMENT SURGERY     URETERAL STENT PLACEMENT     Family History  Problem Relation Age of Onset   Skin cancer Father    Prostate cancer Father    Colon cancer Brother    Prostate cancer Brother    Hypertension Sister    Skin cancer Sister    Diabetes Mellitus II Sister    Breast cancer Sister 72   Breast cancer Other        niece   Social History   Socioeconomic History   Marital status: Widowed    Spouse name: Not on file   Number of children: 4   Years of education: Not on file   Highest education level: Not on file  Occupational History   Not on file  Tobacco Use   Smoking status: Never   Smokeless tobacco: Never  Substance and Sexual Activity  Alcohol use: No    Alcohol/week: 0.0 standard drinks   Drug use: No   Sexual activity: Never  Other Topics Concern   Not on file  Social History Narrative   Not on file   Social Determinants of Health   Financial Resource Strain: Low Risk    Difficulty of Paying Living Expenses: Not hard at all  Food Insecurity: No Food Insecurity   Worried About Charity fundraiser in the Last Year: Never true   [place] in the Last Year: Never true  Transportation Needs: No Transportation Needs   Lack of Transportation (Medical): No   Lack of Transportation (Non-Medical): No  Physical Activity: Not on file  Stress: No Stress Concern Present   Feeling of Stress : Not at all  Social Connections: Unknown   Frequency of Communication with Friends and Family: Not on file   Frequency of Social Gatherings with Friends and Family: More than three times a week   Attends Religious  Services: Not on file   Active Member of Clubs or Organizations: Not on file   Attends Archivist Meetings: Not on file   Marital Status: Not on file     Review of Systems  Constitutional:  Negative for appetite change and unexpected weight change.  HENT:  Negative for congestion and sinus pressure.   Respiratory:  Negative for cough, chest tightness and shortness of breath.   Cardiovascular:  Negative for chest pain, palpitations and leg swelling.  Gastrointestinal:  Negative for abdominal pain, diarrhea, nausea and vomiting.  Genitourinary:  Negative for difficulty urinating and dysuria.  Musculoskeletal:  Negative for joint swelling and myalgias.  Skin:  Negative for color change and rash.  Neurological:  Negative for dizziness, light-headedness and headaches.  Psychiatric/Behavioral:  Negative for agitation and dysphoric mood.       Objective:     BP 122/70   Pulse 73   Temp 97.6 F (36.4 C)   Resp 16   Ht 5\' 3"  (1.6 m)   Wt 133 lb (60.3 kg)   SpO2 98%   BMI 23.56 kg/m  Wt Readings from Last 3 Encounters:  07/30/21 133 lb (60.3 kg)  05/17/21 137 lb 3.2 oz (62.2 kg)  04/16/21 138 lb (62.6 kg)    Physical Exam Vitals reviewed.  Constitutional:      General: She is not in acute distress.    Appearance: Normal appearance.  HENT:     Head: Normocephalic and atraumatic.     Right Ear: External ear normal.     Left Ear: External ear normal.  Eyes:     General: No scleral icterus.       Right eye: No discharge.        Left eye: No discharge.     Conjunctiva/sclera: Conjunctivae normal.  Neck:     Thyroid: No thyromegaly.  Cardiovascular:     Rate and Rhythm: Normal rate and regular rhythm.  Pulmonary:     Effort: No respiratory distress.     Breath sounds: Normal breath sounds. No wheezing.  Abdominal:     General: Bowel sounds are normal.     Palpations: Abdomen is soft.     Tenderness: There is no abdominal tenderness.  Musculoskeletal:         General: No swelling or tenderness.     Cervical back: Neck supple. No tenderness.  Lymphadenopathy:     Cervical: No cervical adenopathy.  Skin:    Findings: No erythema  or rash.  Neurological:     Mental Status: She is alert.  Psychiatric:        Mood and Affect: Mood normal.        Behavior: Behavior normal.     Outpatient Encounter Medications as of 07/30/2021  Medication Sig   Calcium Carbonate-Vitamin D 600-400 MG-UNIT tablet Take 1 tablet by mouth 2 (two) times daily.   Cholecalciferol (VITAMIN D-3) 1000 UNITS CAPS Take 1,000 Units by mouth daily.    CRANBERRY PO Take 1 tablet by mouth daily.   fish oil-omega-3 fatty acids 1000 MG capsule Take 1 g by mouth daily.   ipratropium (ATROVENT) 0.03 % nasal spray Place 1 spray into both nostrils every 12 (twelve) hours.   Multiple Vitamins-Iron (MULTIVITAMINS WITH IRON) TABS tablet Take 1 tablet by mouth daily.   mupirocin ointment (BACTROBAN) 2 % Apply to affected area bid   OVER THE COUNTER MEDICATION Place 1 drop into both eyes daily as needed (for dry eyes).   simvastatin (ZOCOR) 40 MG tablet Take 1 tablet (40 mg total) by mouth every evening.   [DISCONTINUED] cefdinir (OMNICEF) 300 MG capsule Take 1 capsule (300 mg total) by mouth 2 (two) times daily.   [DISCONTINUED] cephALEXin (KEFLEX) 500 MG capsule Take 1 capsule (500 mg total) by mouth 2 (two) times daily.   [DISCONTINUED] ondansetron (ZOFRAN ODT) 4 MG disintegrating tablet Take 1 tablet (4 mg total) by mouth every 6 (six) hours as needed for nausea or vomiting.   Facility-Administered Encounter Medications as of 07/30/2021  Medication   cyanocobalamin ((VITAMIN B-12)) injection 1,000 mcg   cyanocobalamin ((VITAMIN B-12)) injection 1,000 mcg     Lab Results  Component Value Date   WBC 7.2 06/15/2021   HGB 11.4 (L) 06/15/2021   HCT 34.1 (L) 06/15/2021   PLT 208 06/15/2021   GLUCOSE 97 07/30/2021   CHOL 142 07/30/2021   TRIG 142.0 07/30/2021   HDL 58.60 07/30/2021    LDLDIRECT 78.0 04/30/2018   LDLCALC 55 07/30/2021   ALT 13 07/30/2021   AST 15 07/30/2021   NA 136 07/30/2021   K 4.2 07/30/2021   CL 101 07/30/2021   CREATININE 0.91 07/30/2021   BUN 21 07/30/2021   CO2 27 07/30/2021   TSH 2.83 04/16/2021       Assessment & Plan:   Problem List Items Addressed This Visit     GERD (gastroesophageal reflux disease)    Upper symptoms controlled.  On protonix.       History of COVID-19    Recently diagnosed and treated with oral antiviral.  Doing well.  No residual problems.        History of hyperparathyroidism    S/p parathyroidectomy.  Follow calcium.       Hypercholesterolemia - Primary    On simvastatin.  Low cholesterol diet and exercise.  Follow lipid panel and liver function tests.        Relevant Medications   simvastatin (ZOCOR) 40 MG tablet   Other Relevant Orders   Lipid panel (Completed)   Hepatic function panel (Completed)   Basic metabolic panel (Completed)   Iron deficiency anemia    Saw hematology.  Previous attempted iron infusion - had reaction.  She continues with multivitamin with iron.  Had f/u 05/17/21.  Felt stable.  Follow cbc and iron studies.       Left knee pain    Saw ortho.  S/p injection.  Following with ortho.       Memory change  Osteoporosis    Has declined prescription treatment.  Continue calcium, vitamin D and weight bearing exercise.  Follow.       Stress    Continue zoloft.  Appears to be doing better.       Vitamin D deficiency    Continue vitamin D supplements.       Other Visit Diagnoses     Need for immunization against influenza       Relevant Orders   Flu Vaccine QUAD High Dose(Fluad) (Completed)        Einar Pheasant, MD

## 2021-07-31 LAB — BASIC METABOLIC PANEL
BUN: 21 mg/dL (ref 6–23)
CO2: 27 mEq/L (ref 19–32)
Calcium: 9.8 mg/dL (ref 8.4–10.5)
Chloride: 101 mEq/L (ref 96–112)
Creatinine, Ser: 0.91 mg/dL (ref 0.40–1.20)
GFR: 55.45 mL/min — ABNORMAL LOW (ref >60.00)
Glucose, Bld: 97 mg/dL (ref 70–99)
Potassium: 4.2 mEq/L (ref 3.5–5.1)
Sodium: 136 mEq/L (ref 135–145)

## 2021-07-31 LAB — LIPID PANEL
Cholesterol: 142 mg/dL (ref 0–200)
HDL: 58.6 mg/dL (ref >39.00)
LDL Cholesterol: 55 mg/dL (ref 0–99)
NonHDL: 83.39
Total CHOL/HDL Ratio: 2
Triglycerides: 142 mg/dL (ref 0.0–149.0)
VLDL: 28.4 mg/dL (ref 0.0–40.0)

## 2021-07-31 LAB — HEPATIC FUNCTION PANEL
ALT: 13 U/L (ref 0–35)
AST: 15 U/L (ref 0–37)
Albumin: 4.3 g/dL (ref 3.5–5.2)
Alkaline Phosphatase: 56 U/L (ref 39–117)
Bilirubin, Direct: 0.1 mg/dL (ref 0.0–0.3)
Total Bilirubin: 0.5 mg/dL (ref 0.2–1.2)
Total Protein: 6.2 g/dL (ref 6.0–8.3)

## 2021-08-02 ENCOUNTER — Ambulatory Visit: Payer: Medicare Other

## 2021-08-05 ENCOUNTER — Encounter: Payer: Self-pay | Admitting: Internal Medicine

## 2021-08-05 DIAGNOSIS — Z8616 Personal history of COVID-19: Secondary | ICD-10-CM | POA: Insufficient documentation

## 2021-08-05 NOTE — Assessment & Plan Note (Signed)
Has declined prescription treatment.  Continue calcium, vitamin D and weight bearing exercise.  Follow.

## 2021-08-05 NOTE — Assessment & Plan Note (Signed)
Continue zoloft.  Appears to be doing better.

## 2021-08-05 NOTE — Assessment & Plan Note (Signed)
S/p parathyroidectomy.  Follow calcium.

## 2021-08-05 NOTE — Assessment & Plan Note (Signed)
Continue vitamin D supplements.  

## 2021-08-05 NOTE — Assessment & Plan Note (Signed)
On simvastatin.  Low cholesterol diet and exercise.  Follow lipid panel and liver function tests.   

## 2021-08-05 NOTE — Assessment & Plan Note (Signed)
Saw ortho.  S/p injection.  Following with ortho.

## 2021-08-05 NOTE — Assessment & Plan Note (Signed)
Saw hematology.  Previous attempted iron infusion - had reaction.  She continues with multivitamin with iron.  Had f/u 05/17/21.  Felt stable.  Follow cbc and iron studies.

## 2021-08-05 NOTE — Assessment & Plan Note (Signed)
Recently diagnosed and treated with oral antiviral.  Doing well.  No residual problems.

## 2021-08-05 NOTE — Assessment & Plan Note (Signed)
Upper symptoms controlled.  On protonix.  

## 2021-08-28 ENCOUNTER — Ambulatory Visit (INDEPENDENT_AMBULATORY_CARE_PROVIDER_SITE_OTHER): Payer: Medicare Other

## 2021-08-28 ENCOUNTER — Other Ambulatory Visit: Payer: Self-pay

## 2021-08-28 DIAGNOSIS — E538 Deficiency of other specified B group vitamins: Secondary | ICD-10-CM

## 2021-08-28 MED ORDER — CYANOCOBALAMIN 1000 MCG/ML IJ SOLN
1000.0000 ug | Freq: Once | INTRAMUSCULAR | Status: AC
  Administered 2021-08-28: 1000 ug via INTRAMUSCULAR

## 2021-08-28 NOTE — Progress Notes (Signed)
Patient presented for B 12 injection to left deltoid, patient voiced no concerns nor showed any signs of distress during injection. 

## 2021-08-30 ENCOUNTER — Ambulatory Visit: Payer: Medicare Other

## 2021-10-01 ENCOUNTER — Ambulatory Visit (INDEPENDENT_AMBULATORY_CARE_PROVIDER_SITE_OTHER): Payer: Medicare Other

## 2021-10-01 ENCOUNTER — Other Ambulatory Visit: Payer: Self-pay

## 2021-10-01 ENCOUNTER — Emergency Department
Admission: EM | Admit: 2021-10-01 | Discharge: 2021-10-01 | Disposition: A | Discharge status: Home / Self Care | Payer: Medicare Other | Attending: Emergency Medicine | Admitting: Emergency Medicine

## 2021-10-01 ENCOUNTER — Telehealth: Payer: Self-pay | Admitting: Internal Medicine

## 2021-10-01 ENCOUNTER — Emergency Department: Payer: Medicare Other

## 2021-10-01 ENCOUNTER — Ambulatory Visit: Payer: Medicare Other

## 2021-10-01 DIAGNOSIS — M79605 Pain in left leg: Secondary | ICD-10-CM

## 2021-10-01 DIAGNOSIS — Z85828 Personal history of other malignant neoplasm of skin: Secondary | ICD-10-CM | POA: Diagnosis not present

## 2021-10-01 DIAGNOSIS — Z8616 Personal history of COVID-19: Secondary | ICD-10-CM | POA: Diagnosis not present

## 2021-10-01 DIAGNOSIS — R2243 Localized swelling, mass and lump, lower limb, bilateral: Secondary | ICD-10-CM | POA: Diagnosis not present

## 2021-10-01 DIAGNOSIS — E538 Deficiency of other specified B group vitamins: Secondary | ICD-10-CM | POA: Diagnosis not present

## 2021-10-01 DIAGNOSIS — M7989 Other specified soft tissue disorders: Secondary | ICD-10-CM | POA: Diagnosis not present

## 2021-10-01 MED ORDER — ACETAMINOPHEN 325 MG PO TABS
650.0000 mg | ORAL_TABLET | Freq: Once | ORAL | Status: AC
  Administered 2021-10-01: 17:00:00 650 mg via ORAL
  Filled 2021-10-01: qty 2

## 2021-10-01 NOTE — Discharge Instructions (Signed)
Use Tylenol for pain and fevers.  Up to 1000 mg per dose, up to 4 times per day.  Do not take more than 4000 mg of Tylenol/acetaminophen within 24 hours..  

## 2021-10-01 NOTE — ED Provider Notes (Signed)
[facility] Emergency Department Provider Note ____________________________________________   Event Date/Time   First MD Initiated Contact with Patient 10/01/21 1750     (approximate)  I have reviewed the triage vital signs and the nursing notes.  HISTORY  Chief Complaint Leg Pain   HPI Sabrina Cervantes is a 87 y.o. femalewho presents to the ED for evaluation of leg pain.   Chart review indicates hx arthritis.   Patient presents to the ED, companied by her son, for evaluation of subacute left leg swelling and pain.  Patient's son moved in with her and has been up and care for her for the past 6 weeks.  Her most at this time.  He reports that she has been intermittently complaining of swelling and pain to bilateral legs, left greater than right.  No falls or injuries, no fevers, chest pain or shortness of breath.  They spoke with the nurse line at their PCP and directed to the ED for evaluation of a DVT due to asymmetry of lower extremity swelling.  Past Medical History:  Diagnosis Date   Anemia    Anxiety    Arthritis    "left hip" (08/15/2016)   Basal cell carcinoma    forehead & nose; [facility] cut them off; deep cutting   Depression    GERD (gastroesophageal reflux disease)    Hepatitis    "think I had it as a child"   History of kidney stones    HOH (hard of hearing)    Hypercholesterolemia    Osteoporosis    Pyelonephritis, acute     s/p ureteral stent   Vasomotor rhinitis    chronic    Patient Active Problem List   Diagnosis Date Noted   History of COVID-19 08/05/2021   Urinary frequency 06/25/2020   Sleep difficulties 06/25/2020   Memory change 06/13/2020   Hypokalemia 01/31/2020   Iron deficiency anemia 10/23/2019   Left knee pain 06/15/2019   Left hip pain 05/04/2018   History of hyperparathyroidism 06/25/2016   Vitamin D deficiency 02/17/2016   Health care maintenance 04/17/2015   Dizziness 10/02/2014   Sinusitis 10/02/2014    Environmental allergies 07/31/2014   Stress 07/31/2014   Constipation 01/23/2014   Leg skin lesion, left 07/21/2013   Osteoporosis 09/29/2012   GERD (gastroesophageal reflux disease) 09/29/2012   Hypercholesterolemia 09/29/2012    Past Surgical History:  Procedure Laterality Date   ABDOMINAL HYSTERECTOMY  1968   secondary to prolapse, ovaries not removed   BASAL CELL CARCINOMA EXCISION     forehead & nose; [place] cut them off; deep cutting   CATARACT EXTRACTION, BILATERAL Bilateral    COLONOSCOPY     DILATION AND CURETTAGE OF UTERUS     S/P miscarriage   LITHOTRIPSY     Dr Yves Dill, nephrolithiasis   PARATHYROIDECTOMY Left 08/15/2016    inferior minimally invasive /notes 08/15/2016   PARATHYROIDECTOMY Left 08/15/2016   Procedure: LEFT INFERIOR PARATHYROIDECTOMY;  Surgeon: Armandina Gemma, MD;  Location: [place];  Service: General;  Laterality: Left;   RETINAL DETACHMENT SURGERY     URETERAL STENT PLACEMENT      Prior to Admission medications   Medication Sig Start Date End Date Taking? Authorizing Provider  Calcium Carbonate-Vitamin D 600-400 MG-UNIT tablet Take 1 tablet by mouth 2 (two) times daily.    [provider]  Cholecalciferol (VITAMIN D-3) 1000 UNITS CAPS Take 1,000 Units by mouth daily.     [provider]  CRANBERRY PO  Take 1 tablet by mouth daily.    [provider]  fish oil-omega-3 fatty acids 1000 MG capsule Take 1 g by mouth daily.    [provider]  ipratropium (ATROVENT) 0.03 % nasal spray Place 1 spray into both nostrils every 12 (twelve) hours. 01/27/20   Einar Pheasant, MD  Multiple Vitamins-Iron (MULTIVITAMINS WITH IRON) TABS tablet Take 1 tablet by mouth daily.    [provider]  mupirocin ointment (BACTROBAN) 2 % Apply to affected area bid 05/04/18   Einar Pheasant, MD  OVER THE COUNTER MEDICATION Place 1 drop into both eyes daily as needed (for dry eyes).    [provider]  simvastatin (ZOCOR) 40  MG tablet Take 1 tablet (40 mg total) by mouth every evening. 07/30/21   Einar Pheasant, MD    Allergies Pyridium [phenazopyridine hcl]  Family History  Problem Relation Age of Onset   Skin cancer Father    Prostate cancer Father    Colon cancer Brother    Prostate cancer Brother    Hypertension Sister    Skin cancer Sister    Diabetes Mellitus II Sister    Breast cancer Sister 61   Breast cancer Other        niece    Social History Social History   Tobacco Use   Smoking status: Never   Smokeless tobacco: Never  Substance Use Topics   Alcohol use: No    Alcohol/week: 0.0 standard drinks   Drug use: No    Review of Systems  Constitutional: No fever/chills Eyes: No visual changes. ENT: No sore throat. Cardiovascular: Denies chest pain. Respiratory: Denies shortness of breath. Gastrointestinal: No abdominal pain.  No nausea, no vomiting.  No diarrhea.  No constipation. Genitourinary: Negative for dysuria. Musculoskeletal: Negative for back pain. Positive atraumatic leg swelling and pain Skin: Negative for rash. Neurological: Negative for headaches, focal weakness or numbness. ____________________________________________   PHYSICAL EXAM:  VITAL SIGNS: Vitals:   10/01/21 1642 10/01/21 1816  BP: (!) 152/88 140/62  Pulse: 74 68  Resp: 18 14  Temp: 98.2 F (36.8 C)   SpO2: 100% 98%    Constitutional: Alert and oriented. Well appearing and in no acute distress. Eyes: Conjunctivae are normal. PERRL. EOMI. Head: Atraumatic. Nose: No congestion/rhinnorhea. Mouth/Throat: Mucous membranes are moist.  Oropharynx non-erythematous. Neck: No stridor. No cervical spine tenderness to palpation. Cardiovascular: Normal rate, regular rhythm. Grossly normal heart sounds.  Good peripheral circulation. Respiratory: Normal respiratory effort.  No retractions. Lungs CTAB. Gastrointestinal: Soft , nondistended, nontender to palpation. No CVA tenderness. Musculoskeletal:  No  joint effusions. No signs of acute trauma. Full active and passive ROM of bilateral lower extremities at the hip, knees and ankles bilaterally. Minimal asymmetry with soft tissue swelling and trace edema to bilateral legs, left greater than right.  No overlying skin changes, erythema or signs of trauma.  No rash. Neurologic:  Normal speech and language. No gross focal neurologic deficits are appreciated. No gait instability noted. Skin:  Skin is warm, dry and intact. No rash noted. Psychiatric: Mood and affect are normal. Speech and behavior are normal. ____________________________________________   LABS (all labs ordered are listed, but only abnormal results are displayed)  Labs Reviewed - No data to display ____________________________________________  12 Lead EKG   ____________________________________________  RADIOLOGY  ED MD interpretation:    Official radiology report(s): US Venous Img Lower Unilateral Left  Result Date: 10/01/2021 CLINICAL DATA:  Left lower extremity pain and swelling. EXAM: Left LOWER EXTREMITY  VENOUS DOPPLER ULTRASOUND TECHNIQUE: Gray-scale sonography with compression, as well as color and duplex ultrasound, were performed to evaluate the deep venous system(s) from the level of the common femoral vein through the popliteal and proximal calf veins. COMPARISON:  None. FINDINGS: VENOUS Normal compressibility of the common femoral, superficial femoral, and popliteal veins, as well as the visualized calf veins. Visualized portions of profunda femoral vein and great saphenous vein unremarkable. No filling defects to suggest DVT on grayscale or color Doppler imaging. Doppler waveforms show normal direction of venous flow, normal respiratory plasticity and response to augmentation. Limited views of the contralateral common femoral vein are unremarkable. OTHER None. Limitations: none IMPRESSION: Negative. Electronically Signed   By: Anner Crete M.D.   On: 10/01/2021  17:46    ____________________________________________   PROCEDURES and INTERVENTIONS  Procedure(s) performed (including Critical Care):  Procedures  Medications  acetaminophen (TYLENOL) tablet 650 mg (650 mg Oral Given 10/01/21 1654)    ____________________________________________   MDM / ED COURSE   87 year old female presents to the ED with subacute leg swelling and pain, without evidence of significant acute pathology or DVT, and amenable to outpatient management.  Normal vitals.  She looks clinically well.  No evidence of systemic illness.  No signs of DVT on venous ultrasound.  She looks well without evidence of vascular or neurologic deficits.  We discussed management of swelling at home and following up with her PCP.  We discussed return precautions.     ____________________________________________   FINAL CLINICAL IMPRESSION(S) / ED DIAGNOSES  Final diagnoses:  Left leg pain     ED Discharge Orders     None        Anacristina Steffek   Note:  This document was prepared using Dragon voice recognition software and may include unintentional dictation errors.    Vladimir Crofts, MD 10/01/21 [phone]

## 2021-10-01 NOTE — Telephone Encounter (Signed)
Reviewed. Pt being evaluated   Dr Nicki Reaper

## 2021-10-01 NOTE — ED Triage Notes (Signed)
Pt was sent from the urgent care to r/o DVT in the left leg, states she has had swelling over the past couple of weeks and is having pain in it. Denies injury, states she has a hx of swelling in BL LE.Sabrina Cervantes

## 2021-10-01 NOTE — Progress Notes (Signed)
Patient presenting for monthly b12 injection. Given in the left deltoid. Patient tolerated well.    Patient presenting with leg swelling in both lower legs. Right leg swollen larger than the left. Left leg pain rated 6/10. Patient states this has been ongoing for the last few months. Informed nursing lead of the need for triage. RN lead then went in to triage Patient. This will be documented in phone note.

## 2021-10-01 NOTE — ED Provider Notes (Signed)
Emergency Medicine Provider Triage Evaluation Note  Sabrina Cervantes , a 87 y.o. female  was evaluated in triage.  Pt complains of left leg pain and swelling.  She presents with 5 to 6 weeks of intermittent left leg pain and swelling patient denies any recent injury, trauma, fall.  She has a history of bilateral lower extremity swelling, but presents with unilateral swelling at this time.  No fevers, chills, sweats, chest pain, shortness breath reported.  Review of Systems  Positive: LLE edema, pain Negative: CP, SOB  Physical Exam  BP (!) 152/88 (BP Location: Left Arm)   Pulse 74   Temp 98.2 F (36.8 C) (Oral)   Resp 18   Ht 5\' 3"  (1.6 m)   SpO2 100%   BMI 23.56 kg/m  Gen:   Awake, no distress  NAD Resp:  Normal effort CTA MSK:   Moves extremities without difficulty LLE edema noted Other:  CVS: RRR  Medical Decision Making  Medically screening exam initiated at 4:48 PM.  Appropriate orders placed.  Sabrina Cervantes was informed that the remainder of the evaluation will be completed by another provider, this initial triage assessment does not replace that evaluation, and the importance of remaining in the ED until their evaluation is complete.  Geriatric patient with ED evaluation of LLE edema and pain for the last several weeks.    Melvenia Needles, PA-C 10/01/21 1650    Lavonia Drafts, MD 10/01/21 Lurena Nida

## 2021-10-01 NOTE — Telephone Encounter (Signed)
Patient in office for B 12 injection , CMA advised Nurse patient complaint of leg pain and bilateral edema, patient had Bilateral non-pitting edema right greater than left.  Legs cool to touch, no pitting with touch or pressure. Patient complained with Left leg pain with movement and with touch to calf of left leg.  Vitals  T 98.2 Pulse 76,  BP  150/78, Oxygen 98% on room air.  Advised PCP of leg pain and severity agreed with recommendation to be seen at [facility] to Rule out possible DVT , patient agreed and son to drive patient to UC for evaluation.

## 2021-10-30 ENCOUNTER — Other Ambulatory Visit: Payer: Self-pay

## 2021-10-30 ENCOUNTER — Ambulatory Visit (INDEPENDENT_AMBULATORY_CARE_PROVIDER_SITE_OTHER): Payer: Medicare Other | Admitting: Internal Medicine

## 2021-10-30 DIAGNOSIS — M810 Age-related osteoporosis without current pathological fracture: Secondary | ICD-10-CM

## 2021-10-30 DIAGNOSIS — D500 Iron deficiency anemia secondary to blood loss (chronic): Secondary | ICD-10-CM

## 2021-10-30 DIAGNOSIS — E7800 Pure hypercholesterolemia, unspecified: Secondary | ICD-10-CM | POA: Diagnosis not present

## 2021-10-30 DIAGNOSIS — Z9109 Other allergy status, other than to drugs and biological substances: Secondary | ICD-10-CM

## 2021-10-30 DIAGNOSIS — Z8639 Personal history of other endocrine, nutritional and metabolic disease: Secondary | ICD-10-CM

## 2021-10-30 DIAGNOSIS — E538 Deficiency of other specified B group vitamins: Secondary | ICD-10-CM | POA: Diagnosis not present

## 2021-10-30 DIAGNOSIS — K219 Gastro-esophageal reflux disease without esophagitis: Secondary | ICD-10-CM | POA: Diagnosis not present

## 2021-10-30 MED ORDER — CYANOCOBALAMIN 1000 MCG/ML IJ SOLN
1000.0000 ug | Freq: Once | INTRAMUSCULAR | Status: AC
  Administered 2021-10-30: 11:00:00 1000 ug via INTRAMUSCULAR

## 2021-10-30 NOTE — Progress Notes (Signed)
Patient ID: Sabrina Cervantes, female   DOB: 02-09-1929, 87 y.o.   MRN: [id number]   Subjective:    Patient ID: Sabrina Cervantes, female    DOB: [birth year], 87 y.o.   MRN: [id number]  This visit occurred during the SARS-CoV-2 public health emergency.  Safety protocols were in place, including screening questions prior to the visit, additional usage of staff PPE, and extensive cleaning of exam room while observing appropriate contact time as indicated for disinfecting solutions.   Patient here for a scheduled follow up.   Marland Kitchen   HPI Here to follow up regarding her cholesterol, increased stress and osteoporosis.  She is accompanied by her daughter.  History obtained from both of them.  She appears to be doing relatively well.  Overall appears to be doing better.  Eating.  Both sons living with her.  No chest pain or sob reported.  No abdominal pain or bowel change reported.  Discussed due bone denstiy.     Past Medical History:  Diagnosis Date   Anemia    Anxiety    Arthritis    "left hip" (08/15/2016)   Basal cell carcinoma    forehead & nose; [facility]; deep cutting   Depression    GERD (gastroesophageal reflux disease)    Hepatitis    "think I had it as a child"   History of kidney stones    HOH (hard of hearing)    Hypercholesterolemia    Osteoporosis    Pyelonephritis, acute     s/p ureteral stent   Vasomotor rhinitis    chronic   Past Surgical History:  Procedure Laterality Date   ABDOMINAL HYSTERECTOMY  1968   secondary to prolapse, ovaries not removed   BASAL CELL CARCINOMA EXCISION     forehead & nose; [facility] cut them off; deep cutting   CATARACT EXTRACTION, BILATERAL Bilateral    COLONOSCOPY     DILATION AND CURETTAGE OF UTERUS     S/P miscarriage   LITHOTRIPSY     Dr Yves Dill, nephrolithiasis   PARATHYROIDECTOMY Left 08/15/2016    inferior minimally invasive /notes 08/15/2016   PARATHYROIDECTOMY Left 08/15/2016   Procedure: LEFT INFERIOR PARATHYROIDECTOMY;   Surgeon: Armandina Gemma, MD;  Location: [facility];  Service: General;  Laterality: Left;   RETINAL DETACHMENT SURGERY     URETERAL STENT PLACEMENT     Family History  Problem Relation Age of Onset   Skin cancer Father    Prostate cancer Father    Colon cancer Brother    Prostate cancer Brother    Hypertension Sister    Skin cancer Sister    Diabetes Mellitus II Sister    Breast cancer Sister 30   Breast cancer Other        niece   Social History   Socioeconomic History   Marital status: Widowed    Spouse name: Not on file   Number of children: 4   Years of education: Not on file   Highest education level: Not on file  Occupational History   Not on file  Tobacco Use   Smoking status: Never   Smokeless tobacco: Never  Substance and Sexual Activity   Alcohol use: No    Alcohol/week: 0.0 standard drinks   Drug use: No   Sexual activity: Never  Other Topics Concern   Not on file  Social History Narrative   Not on file   Social Determinants of Health   Financial Resource Strain: Low  Risk    Difficulty of Paying Living Expenses: Not hard at all  Food Insecurity: No Food Insecurity   Worried About [place] in the Last Year: Never true   Ran Out of Food in the Last Year: Never true  Transportation Needs: No Transportation Needs   Lack of Transportation (Medical): No   Lack of Transportation (Non-Medical): No  Physical Activity: Not on file  Stress: No Stress Concern Present   Feeling of Stress : Not at all  Social Connections: Unknown   Frequency of Communication with Friends and Family: Not on file   Frequency of Social Gatherings with Friends and Family: More than three times a week   Attends Religious Services: Not on file   Active Member of Clubs or Organizations: Not on file   Attends Archivist Meetings: Not on file   Marital Status: Not on file     Review of Systems  Constitutional:  Negative for appetite change and unexpected weight change.   HENT:  Positive for congestion and postnasal drip. Negative for sinus pressure.        Chronic/stable.   Respiratory:  Negative for cough, chest tightness and shortness of breath.   Cardiovascular:  Negative for chest pain, palpitations and leg swelling.  Gastrointestinal:  Negative for abdominal pain, diarrhea, nausea and vomiting.  Genitourinary:  Negative for difficulty urinating and dysuria.  Musculoskeletal:  Negative for joint swelling and myalgias.  Skin:  Negative for color change and rash.  Neurological:  Negative for dizziness, light-headedness and headaches.  Psychiatric/Behavioral:  Negative for agitation and dysphoric mood.       Objective:     BP 122/70    Pulse 73    Temp 98.1 F (36.7 C)    Resp 16    Ht 5\' 3"  (1.6 m)    Wt 134 lb 6.4 oz (61 kg)    SpO2 98%    BMI 23.81 kg/m  Wt Readings from Last 3 Encounters:  10/30/21 134 lb 6.4 oz (61 kg)  07/30/21 133 lb (60.3 kg)  05/17/21 137 lb 3.2 oz (62.2 kg)    Physical Exam Vitals reviewed.  Constitutional:      General: She is not in acute distress.    Appearance: Normal appearance.  HENT:     Head: Normocephalic and atraumatic.     Right Ear: External ear normal.     Left Ear: External ear normal.  Eyes:     General: No scleral icterus.       Right eye: No discharge.        Left eye: No discharge.     Conjunctiva/sclera: Conjunctivae normal.  Neck:     Thyroid: No thyromegaly.  Cardiovascular:     Rate and Rhythm: Normal rate and regular rhythm.  Pulmonary:     Effort: No respiratory distress.     Breath sounds: Normal breath sounds. No wheezing.  Abdominal:     General: Bowel sounds are normal.     Palpations: Abdomen is soft.     Tenderness: There is no abdominal tenderness.  Musculoskeletal:        General: No swelling or tenderness.     Cervical back: Neck supple. No tenderness.  Lymphadenopathy:     Cervical: No cervical adenopathy.  Skin:    Findings: No erythema or rash.  Neurological:      Mental Status: She is alert.  Psychiatric:        Mood and Affect: Mood normal.  Behavior: Behavior normal.     Outpatient Encounter Medications as of 10/30/2021  Medication Sig   Calcium Carbonate-Vitamin D 600-400 MG-UNIT tablet Take 1 tablet by mouth 2 (two) times daily.   Cholecalciferol (VITAMIN D-3) 1000 UNITS CAPS Take 1,000 Units by mouth daily.    CRANBERRY PO Take 1 tablet by mouth daily.   fish oil-omega-3 fatty acids 1000 MG capsule Take 1 g by mouth daily.   ipratropium (ATROVENT) 0.03 % nasal spray Place 1 spray into both nostrils every 12 (twelve) hours.   Multiple Vitamins-Iron (MULTIVITAMINS WITH IRON) TABS tablet Take 1 tablet by mouth daily.   mupirocin ointment (BACTROBAN) 2 % Apply to affected area bid   OVER THE COUNTER MEDICATION Place 1 drop into both eyes daily as needed (for dry eyes).   simvastatin (ZOCOR) 40 MG tablet Take 1 tablet (40 mg total) by mouth every evening.   Facility-Administered Encounter Medications as of 10/30/2021  Medication   cyanocobalamin ((VITAMIN B-12)) injection 1,000 mcg   cyanocobalamin ((VITAMIN B-12)) injection 1,000 mcg   [COMPLETED] cyanocobalamin ((VITAMIN B-12)) injection 1,000 mcg     Lab Results  Component Value Date   WBC 7.2 06/15/2021   HGB 11.4 (L) 06/15/2021   HCT 34.1 (L) 06/15/2021   PLT 208 06/15/2021   GLUCOSE 97 07/30/2021   CHOL 142 07/30/2021   TRIG 142.0 07/30/2021   HDL 58.60 07/30/2021   LDLDIRECT 78.0 04/30/2018   LDLCALC 55 07/30/2021   ALT 13 07/30/2021   AST 15 07/30/2021   NA 136 07/30/2021   K 4.2 07/30/2021   CL 101 07/30/2021   CREATININE 0.91 07/30/2021   BUN 21 07/30/2021   CO2 27 07/30/2021   TSH 2.83 04/16/2021    US Venous Img Lower Unilateral Left  Result Date: 10/01/2021 CLINICAL DATA:  Left lower extremity pain and swelling. EXAM: Left LOWER EXTREMITY VENOUS DOPPLER ULTRASOUND TECHNIQUE: Gray-scale sonography with compression, as well as color and duplex ultrasound,  were performed to evaluate the deep venous system(s) from the level of the common femoral vein through the popliteal and proximal calf veins. COMPARISON:  None. FINDINGS: VENOUS Normal compressibility of the common femoral, superficial femoral, and popliteal veins, as well as the visualized calf veins. Visualized portions of profunda femoral vein and great saphenous vein unremarkable. No filling defects to suggest DVT on grayscale or color Doppler imaging. Doppler waveforms show normal direction of venous flow, normal respiratory plasticity and response to augmentation. Limited views of the contralateral common femoral vein are unremarkable. OTHER None. Limitations: none IMPRESSION: Negative. Electronically Signed   By: Anner Crete M.D.   On: 10/01/2021 17:46       Assessment & Plan:   Problem List Items Addressed This Visit     Environmental allergies    Persistent/chronic symptoms.  Have discussed taking antihistamine and using nasal spray.  Follow.       GERD (gastroesophageal reflux disease)    Upper symptoms controlled.  On protonix.       History of hyperparathyroidism    S/p parathyroidectomy.  Follow calcium.       Hypercholesterolemia    On simvastatin.  Low cholesterol diet and exercise.  Follow lipid panel and liver function tests.        Iron deficiency anemia    Saw hematology.  Previous attempted iron infusion - had reaction.  She continues with multivitamin with iron.  Had f/u 05/17/21.  Felt stable.  Follow cbc and iron studies.  Osteoporosis    Has declined prescription treatment.  Continue calcium, vitamin D and weight bearing exercise.  Discussed due bone density.  Schedule.        Relevant Orders   DG Bone Density   Other Visit Diagnoses     B12 deficiency       Relevant Medications   cyanocobalamin ((VITAMIN B-12)) injection 1,000 mcg (Completed)        Einar Pheasant, MD

## 2021-11-05 ENCOUNTER — Encounter: Payer: Self-pay | Admitting: Internal Medicine

## 2021-11-05 NOTE — Assessment & Plan Note (Signed)
Persistent/chronic symptoms.  Have discussed taking antihistamine and using nasal spray.  Follow.

## 2021-11-05 NOTE — Assessment & Plan Note (Signed)
Saw hematology.  Previous attempted iron infusion - had reaction.  She continues with multivitamin with iron.  Had f/u 05/17/21.  Felt stable.  Follow cbc and iron studies.

## 2021-11-05 NOTE — Assessment & Plan Note (Signed)
Has declined prescription treatment.  Continue calcium, vitamin D and weight bearing exercise.  Discussed due bone density.  Schedule.

## 2021-11-05 NOTE — Assessment & Plan Note (Signed)
On simvastatin.  Low cholesterol diet and exercise.  Follow lipid panel and liver function tests.   

## 2021-11-05 NOTE — Assessment & Plan Note (Signed)
Upper symptoms controlled.  On protonix.  

## 2021-11-05 NOTE — Assessment & Plan Note (Signed)
S/p parathyroidectomy.  Follow calcium.

## 2021-11-20 ENCOUNTER — Other Ambulatory Visit: Payer: Self-pay | Admitting: *Deleted

## 2021-11-20 DIAGNOSIS — D509 Iron deficiency anemia, unspecified: Secondary | ICD-10-CM

## 2021-11-22 NOTE — Progress Notes (Signed)
[facility]  [phone] [phone]  ID: Sabrina Cervantes OB: 18-Dec-1928  MR#: [id number]  ZTI#:[id number]  Patient Care Team: Einar Pheasant, MD as PCP - General (Internal Medicine)  CHIEF COMPLAINT: Anemia, unspecified.  INTERVAL HISTORY: Patient returns to clinic today for repeat laboratory work and further evaluation.  She currently feels well and is asymptomatic.  She does not complain of any weakness or fatigue.  She has no neurologic complaints.  She denies any recent fevers or illnesses.  She has a good appetite and denies weight loss.  She has no chest pain, shortness of breath, cough, or hemoptysis.  She denies any nausea, vomiting, constipation, or diarrhea.  She has no melena or hematochezia.  She has no urinary complaints.  Patient offers no specific complaints today.  REVIEW OF SYSTEMS:   Review of Systems  Constitutional: Negative.  Negative for fever and weight loss.  Respiratory: Negative.  Negative for cough, hemoptysis and shortness of breath.   Cardiovascular: Negative.  Negative for chest pain and leg swelling.  Gastrointestinal: Negative.  Negative for abdominal pain, blood in stool and melena.  Genitourinary: Negative.  Negative for hematuria.  Musculoskeletal: Negative.  Negative for back pain.  Skin: Negative.  Negative for rash.  Neurological: Negative.  Negative for dizziness, focal weakness, weakness and headaches.  Psychiatric/Behavioral: Negative.  The patient is not nervous/anxious.    As per HPI. Otherwise, a complete review of systems is negative.  PAST MEDICAL HISTORY: Past Medical History:  Diagnosis Date   Anemia    Anxiety    Arthritis    "left hip" (08/15/2016)   Basal cell carcinoma    forehead & nose; [facility] cut them off; deep cutting   Depression    GERD (gastroesophageal reflux disease)    Hepatitis    "think I had it as a child"   History of kidney stones    HOH (hard of hearing)     Hypercholesterolemia    Osteoporosis    Pyelonephritis, acute     s/p ureteral stent   Vasomotor rhinitis    chronic    PAST SURGICAL HISTORY: Past Surgical History:  Procedure Laterality Date   ABDOMINAL HYSTERECTOMY  1968   secondary to prolapse, ovaries not removed   BASAL CELL CARCINOMA EXCISION     forehead & nose; [facility] cut them off; deep cutting   CATARACT EXTRACTION, BILATERAL Bilateral    COLONOSCOPY     DILATION AND CURETTAGE OF UTERUS     S/P miscarriage   LITHOTRIPSY     Dr Yves Dill, nephrolithiasis   PARATHYROIDECTOMY Left 08/15/2016    inferior minimally invasive /notes 08/15/2016   PARATHYROIDECTOMY Left 08/15/2016   Procedure: LEFT INFERIOR PARATHYROIDECTOMY;  Surgeon: Armandina Gemma, MD;  Location: [facility];  Service: General;  Laterality: Left;   RETINAL DETACHMENT SURGERY     URETERAL STENT PLACEMENT      FAMILY HISTORY: Family History  Problem Relation Age of Onset   Skin cancer Father    Prostate cancer Father    Colon cancer Brother    Prostate cancer Brother    Hypertension Sister    Skin cancer Sister    Diabetes Mellitus II Sister    Breast cancer Sister 58   Breast cancer Other        niece    ADVANCED DIRECTIVES (Y/N):  N  HEALTH MAINTENANCE: Social History   Tobacco Use   Smoking status: Never   Smokeless tobacco: Never  Substance Use  Topics   Alcohol use: No    Alcohol/week: 0.0 standard drinks   Drug use: No     Colonoscopy:  PAP:  Bone density:  Lipid panel:  Allergies  Allergen Reactions   Pyridium [Phenazopyridine Hcl] Itching and Rash    Current Outpatient Medications  Medication Sig Dispense Refill   Calcium Carbonate-Vitamin D 600-400 MG-UNIT tablet Take 1 tablet by mouth 2 (two) times daily.     Cholecalciferol (VITAMIN D-3) 1000 UNITS CAPS Take 1,000 Units by mouth daily.      CRANBERRY PO Take 1 tablet by mouth daily.     fish oil-omega-3 fatty acids 1000 MG capsule Take 1 g by mouth daily.     ipratropium  (ATROVENT) 0.03 % nasal spray Place 1 spray into both nostrils every 12 (twelve) hours. 30 mL 1   Multiple Vitamins-Iron (MULTIVITAMINS WITH IRON) TABS tablet Take 1 tablet by mouth daily.     mupirocin ointment (BACTROBAN) 2 % Apply to affected area bid 22 g 0   OVER THE COUNTER MEDICATION Place 1 drop into both eyes daily as needed (for dry eyes).     simvastatin (ZOCOR) 40 MG tablet Take 1 tablet (40 mg total) by mouth every evening. 90 tablet 3   Current Facility-Administered Medications  Medication Dose Route Frequency Provider Last Rate Last Admin   cyanocobalamin ((VITAMIN B-12)) injection 1,000 mcg  1,000 mcg Intramuscular Q30 days Einar Pheasant, MD   1,000 mcg at 10/01/21 1417   cyanocobalamin ((VITAMIN B-12)) injection 1,000 mcg  1,000 mcg Intramuscular Once Burnard Hawthorne, FNP        OBJECTIVE: Vitals:   11/27/21 1359  BP: 138/74  Pulse: 75  Temp: 98.8 F (37.1 C)     Body mass index is 23.77 kg/m.    ECOG FS:0 - Asymptomatic  General: Well-developed, well-nourished, no acute distress. Eyes: Pink conjunctiva, anicteric sclera. HEENT: Normocephalic, moist mucous membranes. Lungs: No audible wheezing or coughing. Heart: Regular rate and rhythm. Abdomen: Soft, nontender, no obvious distention. Musculoskeletal: No edema, cyanosis, or clubbing. Neuro: Alert, answering all questions appropriately. Cranial nerves grossly intact. Skin: No rashes or petechiae noted. Psych: Normal affect.   LAB RESULTS:  Lab Results  Component Value Date   NA 136 07/30/2021   K 4.2 07/30/2021   CL 101 07/30/2021   CO2 27 07/30/2021   GLUCOSE 97 07/30/2021   BUN 21 07/30/2021   CREATININE 0.91 07/30/2021   CALCIUM 9.8 07/30/2021   PROT 6.2 07/30/2021   ALBUMIN 4.3 07/30/2021   AST 15 07/30/2021   ALT 13 07/30/2021   ALKPHOS 56 07/30/2021   BILITOT 0.5 07/30/2021   GFRNONAA >60 06/15/2021   GFRAA >60 08/16/2016    Lab Results  Component Value Date   WBC 6.9 11/27/2021    NEUTROABS 4.0 11/27/2021   HGB 11.6 (L) 11/27/2021   HCT 35.5 (L) 11/27/2021   MCV 91.7 11/27/2021   PLT 259 11/27/2021   Lab Results  Component Value Date   IRON 60 11/27/2021   TIBC 281 11/27/2021   IRONPCTSAT 21 11/27/2021   Lab Results  Component Value Date   FERRITIN 36 11/27/2021    STUDIES: No results found.  ASSESSMENT: Anemia, unspecified.  PLAN:    1. Anemia, unspecified: Patient's hemoglobin has trended up slightly to 11.6.  All of her other laboratory work including iron stores are either negative or within normal limits.  Previously, patient had allergic reaction to Feraheme, but no further iron infusions are needed at  this time.  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   After discussion with the patient and her daughter, it was agreed upon that no further follow-up is necessary.  Please refer patient back if there are any questions or concerns.  I spent a total of 20 minutes reviewing chart data, face-to-face evaluation with the patient, counseling and coordination of care as detailed above.   Patient expressed understanding and was in agreement with this plan. She also understands that She can call clinic at any time with any questions, concerns, or complaints.    Lloyd Huger, MD   11/28/2021 6:59 PM

## 2021-11-27 ENCOUNTER — Inpatient Hospital Stay: Payer: Medicare Other

## 2021-11-27 ENCOUNTER — Inpatient Hospital Stay: Payer: Medicare Other | Attending: Oncology

## 2021-11-27 ENCOUNTER — Inpatient Hospital Stay (HOSPITAL_BASED_OUTPATIENT_CLINIC_OR_DEPARTMENT_OTHER): Payer: Medicare Other | Admitting: Oncology

## 2021-11-27 ENCOUNTER — Other Ambulatory Visit: Payer: Self-pay | Admitting: Emergency Medicine

## 2021-11-27 ENCOUNTER — Other Ambulatory Visit: Payer: Self-pay

## 2021-11-27 ENCOUNTER — Encounter: Payer: Self-pay | Admitting: Oncology

## 2021-11-27 VITALS — BP 138/74 | HR 75 | Temp 98.8°F | Wt 134.2 lb

## 2021-11-27 DIAGNOSIS — Z90710 Acquired absence of both cervix and uterus: Secondary | ICD-10-CM | POA: Diagnosis not present

## 2021-11-27 DIAGNOSIS — D649 Anemia, unspecified: Secondary | ICD-10-CM | POA: Diagnosis not present

## 2021-11-27 DIAGNOSIS — D509 Iron deficiency anemia, unspecified: Secondary | ICD-10-CM | POA: Diagnosis not present

## 2021-11-27 DIAGNOSIS — E538 Deficiency of other specified B group vitamins: Secondary | ICD-10-CM

## 2021-11-27 DIAGNOSIS — Z803 Family history of malignant neoplasm of breast: Secondary | ICD-10-CM | POA: Insufficient documentation

## 2021-11-27 DIAGNOSIS — Z808 Family history of malignant neoplasm of other organs or systems: Secondary | ICD-10-CM | POA: Diagnosis not present

## 2021-11-27 DIAGNOSIS — Z8042 Family history of malignant neoplasm of prostate: Secondary | ICD-10-CM | POA: Diagnosis not present

## 2021-11-27 DIAGNOSIS — Z800 Family history of malignant neoplasm of digestive organs: Secondary | ICD-10-CM | POA: Insufficient documentation

## 2021-11-27 LAB — CBC WITH DIFFERENTIAL/PLATELET
Abs Immature Granulocytes: 0.05 10*3/uL (ref 0.00–0.07)
Basophils Absolute: 0.1 10*3/uL (ref 0.0–0.1)
Basophils Relative: 1 %
Eosinophils Absolute: 0.1 10*3/uL (ref 0.0–0.5)
Eosinophils Relative: 2 %
HCT: 35.5 % — ABNORMAL LOW (ref 36.0–46.0)
Hemoglobin: 11.6 g/dL — ABNORMAL LOW (ref 12.0–15.0)
Immature Granulocytes: 1 %
Lymphocytes Relative: 30 %
Lymphs Abs: 2.1 10*3/uL (ref 0.7–4.0)
MCH: 30 pg (ref 26.0–34.0)
MCHC: 32.7 g/dL (ref 30.0–36.0)
MCV: 91.7 fL (ref 80.0–100.0)
Monocytes Absolute: 0.6 10*3/uL (ref 0.1–1.0)
Monocytes Relative: 8 %
Neutro Abs: 4 10*3/uL (ref 1.7–7.7)
Neutrophils Relative %: 58 %
Platelets: 259 10*3/uL (ref 150–400)
RBC: 3.87 MIL/uL (ref 3.87–5.11)
RDW: 14.1 % (ref 11.5–15.5)
WBC: 6.9 10*3/uL (ref 4.0–10.5)
nRBC: 0 % (ref 0.0–0.2)

## 2021-11-27 LAB — VITAMIN B12: Vitamin B-12: 451 pg/mL (ref 180–914)

## 2021-11-27 LAB — IRON AND TIBC
Iron: 60 ug/dL (ref 28–170)
Saturation Ratios: 21 % (ref 10.4–31.8)
TIBC: 281 ug/dL (ref 250–450)
UIBC: 221 ug/dL

## 2021-11-27 LAB — FERRITIN: Ferritin: 36 ng/mL (ref 11–307)

## 2021-11-27 NOTE — Progress Notes (Signed)
Pt is having a lot of pain in her LT knee and thigh.

## 2021-11-28 ENCOUNTER — Encounter: Payer: Self-pay | Admitting: Oncology

## 2021-12-03 ENCOUNTER — Other Ambulatory Visit: Payer: Self-pay

## 2021-12-03 ENCOUNTER — Ambulatory Visit (INDEPENDENT_AMBULATORY_CARE_PROVIDER_SITE_OTHER): Payer: Medicare Other | Admitting: *Deleted

## 2021-12-03 DIAGNOSIS — E538 Deficiency of other specified B group vitamins: Secondary | ICD-10-CM | POA: Diagnosis not present

## 2021-12-03 MED ORDER — CYANOCOBALAMIN 1000 MCG/ML IJ SOLN
1000.0000 ug | Freq: Once | INTRAMUSCULAR | Status: AC
  Administered 2021-12-03: 1000 ug via INTRAMUSCULAR

## 2021-12-03 NOTE — Progress Notes (Signed)
Patient presented for B 12 injection to left deltoid, patient voiced no concerns nor showed any signs of distress during injection. 

## 2022-01-03 ENCOUNTER — Ambulatory Visit (INDEPENDENT_AMBULATORY_CARE_PROVIDER_SITE_OTHER): Payer: Medicare Other | Admitting: *Deleted

## 2022-01-03 ENCOUNTER — Other Ambulatory Visit: Payer: Self-pay

## 2022-01-03 DIAGNOSIS — E538 Deficiency of other specified B group vitamins: Secondary | ICD-10-CM

## 2022-01-03 MED ORDER — CYANOCOBALAMIN 1000 MCG/ML IJ SOLN
1000.0000 ug | Freq: Once | INTRAMUSCULAR | Status: AC
  Administered 2022-01-03: 1000 ug via INTRAMUSCULAR

## 2022-01-03 NOTE — Progress Notes (Signed)
Pt arrived for B12 injection, given in R deltoid. Pt tolerated injection well, showed no signs of distress nor voiced any concerns.  

## 2022-01-14 DIAGNOSIS — H353132 Nonexudative age-related macular degeneration, bilateral, intermediate dry stage: Secondary | ICD-10-CM | POA: Diagnosis not present

## 2022-01-14 DIAGNOSIS — H02055 Trichiasis without entropian left lower eyelid: Secondary | ICD-10-CM | POA: Diagnosis not present

## 2022-02-04 ENCOUNTER — Ambulatory Visit (INDEPENDENT_AMBULATORY_CARE_PROVIDER_SITE_OTHER): Payer: Medicare Other

## 2022-02-04 DIAGNOSIS — E538 Deficiency of other specified B group vitamins: Secondary | ICD-10-CM | POA: Diagnosis not present

## 2022-02-04 MED ORDER — CYANOCOBALAMIN 1000 MCG/ML IJ SOLN
1000.0000 ug | Freq: Once | INTRAMUSCULAR | Status: AC
  Administered 2022-02-04: 1000 ug via INTRAMUSCULAR

## 2022-02-04 NOTE — Progress Notes (Signed)
Pt arrived for B12 injection, given in L deltoid. Pt tolerated injection well, showed no signs of distress nor voiced any concerns.  ?

## 2022-02-18 DIAGNOSIS — M1712 Unilateral primary osteoarthritis, left knee: Secondary | ICD-10-CM | POA: Diagnosis not present

## 2022-02-27 ENCOUNTER — Ambulatory Visit: Payer: Medicare Other

## 2022-02-28 ENCOUNTER — Encounter: Payer: Self-pay | Admitting: Internal Medicine

## 2022-02-28 ENCOUNTER — Ambulatory Visit (INDEPENDENT_AMBULATORY_CARE_PROVIDER_SITE_OTHER): Payer: Medicare Other | Admitting: Internal Medicine

## 2022-02-28 ENCOUNTER — Ambulatory Visit (INDEPENDENT_AMBULATORY_CARE_PROVIDER_SITE_OTHER): Payer: Medicare Other

## 2022-02-28 VITALS — BP 120/68 | HR 64 | Temp 97.5°F | Resp 14 | Ht 63.0 in | Wt 136.0 lb

## 2022-02-28 DIAGNOSIS — N390 Urinary tract infection, site not specified: Secondary | ICD-10-CM | POA: Diagnosis not present

## 2022-02-28 DIAGNOSIS — M7989 Other specified soft tissue disorders: Secondary | ICD-10-CM | POA: Diagnosis not present

## 2022-02-28 DIAGNOSIS — M810 Age-related osteoporosis without current pathological fracture: Secondary | ICD-10-CM | POA: Diagnosis not present

## 2022-02-28 DIAGNOSIS — Z9109 Other allergy status, other than to drugs and biological substances: Secondary | ICD-10-CM | POA: Diagnosis not present

## 2022-02-28 DIAGNOSIS — R350 Frequency of micturition: Secondary | ICD-10-CM

## 2022-02-28 DIAGNOSIS — Z8639 Personal history of other endocrine, nutritional and metabolic disease: Secondary | ICD-10-CM | POA: Diagnosis not present

## 2022-02-28 DIAGNOSIS — Z0000 Encounter for general adult medical examination without abnormal findings: Secondary | ICD-10-CM | POA: Diagnosis not present

## 2022-02-28 DIAGNOSIS — K219 Gastro-esophageal reflux disease without esophagitis: Secondary | ICD-10-CM

## 2022-02-28 DIAGNOSIS — E559 Vitamin D deficiency, unspecified: Secondary | ICD-10-CM

## 2022-02-28 DIAGNOSIS — E7800 Pure hypercholesterolemia, unspecified: Secondary | ICD-10-CM | POA: Diagnosis not present

## 2022-02-28 DIAGNOSIS — F439 Reaction to severe stress, unspecified: Secondary | ICD-10-CM

## 2022-02-28 DIAGNOSIS — D649 Anemia, unspecified: Secondary | ICD-10-CM

## 2022-02-28 DIAGNOSIS — D500 Iron deficiency anemia secondary to blood loss (chronic): Secondary | ICD-10-CM

## 2022-02-28 NOTE — Progress Notes (Signed)
Subjective:   Sabrina Cervantes is a 88 y.o. female who presents for Medicare Annual (Subsequent) preventive examination.  Review of Systems    No ROS.  Medicare Wellness.   Cardiac Risk Factors include: advanced age (>47men, >27 women)     Objective:    Today's Vitals   02/28/22 1425  BP: 120/68  Pulse: 64  Resp: 14  Temp: (!) 97.5 F (36.4 C)  SpO2: 98%  Weight: 136 lb (61.7 kg)  Height: 5\' 3"  (1.6 m)   Body mass index is 24.09 kg/m.     02/28/2022    2:37 PM 11/27/2021    1:59 PM 10/01/2021    4:45 PM 06/15/2021    6:38 PM 05/17/2021    2:07 PM 02/26/2021    2:56 PM 08/07/2020    1:31 PM  Advanced Directives  Does Patient Have a Medical Advance Directive? Yes No No No Yes Yes Yes  Type of Estate agent of [place];Living will    Living will;Healthcare Power of [employer] Power of [employer] Power of Attorney  Does patient want to make changes to medical advance directive? No - Patient declined     No - Patient declined No - Patient declined  Copy of Healthcare Power of Attorney in Chart? Yes - validated most recent copy scanned in chart (See row information)     Yes - validated most recent copy scanned in chart (See row information) Yes - validated most recent copy scanned in chart (See row information)  Would patient like information on creating a medical advance directive?  No - Patient declined No - Patient declined        Current Medications (verified) Outpatient Encounter Medications as of 02/28/2022  Medication Sig   Calcium Carbonate-Vitamin D 600-400 MG-UNIT tablet Take 1 tablet by mouth 2 (two) times daily.   Cholecalciferol (VITAMIN D-3) 1000 UNITS CAPS Take 1,000 Units by mouth daily.    CRANBERRY PO Take 1 tablet by mouth daily.   fish oil-omega-3 fatty acids 1000 MG capsule Take 1 g by mouth daily.   ipratropium (ATROVENT) 0.03 % nasal spray Place 1 spray into both nostrils every 12 (twelve) hours.   Multiple Vitamins-Iron  (MULTIVITAMINS WITH IRON) TABS tablet Take 1 tablet by mouth daily.   mupirocin ointment (BACTROBAN) 2 % Apply to affected area bid   OVER THE COUNTER MEDICATION Place 1 drop into both eyes daily as needed (for dry eyes).   simvastatin (ZOCOR) 40 MG tablet Take 1 tablet (40 mg total) by mouth every evening.   Facility-Administered Encounter Medications as of 02/28/2022  Medication   cyanocobalamin ((VITAMIN B-12)) injection 1,000 mcg   cyanocobalamin ((VITAMIN B-12)) injection 1,000 mcg    Allergies (verified) Pyridium [phenazopyridine hcl]   History: Past Medical History:  Diagnosis Date   Anemia    Anxiety    Arthritis    "left hip" (08/15/2016)   Basal cell carcinoma    forehead & nose; Cancer Center cut them off; deep cutting   Depression    GERD (gastroesophageal reflux disease)    Hepatitis    "think I had it as a child"   History of kidney stones    HOH (hard of hearing)    Hypercholesterolemia    Osteoporosis    Pyelonephritis, acute     s/p ureteral stent   Vasomotor rhinitis    chronic   Past Surgical History:  Procedure Laterality Date   ABDOMINAL HYSTERECTOMY  1968   secondary  to prolapse, ovaries not removed   BASAL CELL CARCINOMA EXCISION     forehead & nose; Cancer Center cut them off; deep cutting   CATARACT EXTRACTION, BILATERAL Bilateral    COLONOSCOPY     DILATION AND CURETTAGE OF UTERUS     S/P miscarriage   LITHOTRIPSY     Dr Evelene Croon, nephrolithiasis   PARATHYROIDECTOMY Left 08/15/2016    inferior minimally invasive /notes 08/15/2016   PARATHYROIDECTOMY Left 08/15/2016   Procedure: LEFT INFERIOR PARATHYROIDECTOMY;  Surgeon: Darnell Level, MD;  Location: [facility] OR;  Service: General;  Laterality: Left;   RETINAL DETACHMENT SURGERY     URETERAL STENT PLACEMENT     Family History  Problem Relation Age of Onset   Skin cancer Father    Prostate cancer Father    Colon cancer Brother    Prostate cancer Brother    Hypertension Sister    Skin cancer  Sister    Diabetes Mellitus II Sister    Breast cancer Sister 30   Breast cancer Other        niece   Social History   Socioeconomic History   Marital status: Widowed    Spouse name: Not on file   Number of children: 4   Years of education: Not on file   Highest education level: Not on file  Occupational History   Not on file  Tobacco Use   Smoking status: Never   Smokeless tobacco: Never  Substance and Sexual Activity   Alcohol use: No    Alcohol/week: 0.0 standard drinks   Drug use: No   Sexual activity: Never  Other Topics Concern   Not on file  Social History Narrative   Not on file   Social Determinants of Health   Financial Resource Strain: Low Risk    Difficulty of Paying Living Expenses: Not hard at all  Food Insecurity: No Food Insecurity   Worried About Programme researcher, broadcasting/film/video in the Last Year: Never true   Ran Out of Food in the Last Year: Never true  Transportation Needs: No Transportation Needs   Lack of Transportation (Medical): No   Lack of Transportation (Non-Medical): No  Physical Activity: Not on file  Stress: No Stress Concern Present   Feeling of Stress : Not at all  Social Connections: Unknown   Frequency of Communication with Friends and Family: Not on file   Frequency of Social Gatherings with Friends and Family: More than three times a week   Attends Religious Services: Not on Scientist, clinical (histocompatibility and immunogenetics) or Organizations: Not on file   Attends Banker Meetings: Not on file   Marital Status: Not on file   Tobacco Counseling Counseling given: Not Answered  Clinical Intake: Pre-visit preparation completed: Yes        Diabetes: No  How often do you need to have someone help you when you read instructions, pamphlets, or other written materials from your doctor or pharmacy?: 4 - Often  Interpreter Needed?: No      Activities of Daily Living    02/28/2022    2:40 PM  In your present state of health, do you have any  difficulty performing the following activities:  Hearing? 1  Comment No hearing aids worn. Declines audiology follow up.  Vision? 0  Difficulty concentrating or making decisions? 1  Comment Dx Memory loss. Age appropriate.  Walking or climbing stairs? 1  Comment Unsteady gait. Paces self when walking. No cane/walker in use when  ambulating.  Dressing or bathing? 1  Comment Family assist as needed  Doing errands, shopping? 1  Comment Family Ship broker and eating ? Y  Comment Family assist meal prep. Self feeds.  Using the Toilet? N  In the past six months, have you accidently leaked urine? N  Do you have problems with loss of bowel control? N  Managing your Medications? Y  Comment Family/son assist  Managing your Finances? Y  Comment Family/son assist  Housekeeping or managing your Housekeeping? Y  Comment Family/son assist    Patient Care Team: Dale [place], MD as PCP - General (Internal Medicine)  Indicate any recent Medical Services you may have received from other than [facility] providers in the past year (date may be approximate).     Assessment:   This is a routine wellness examination for [facility].  Hearing/Vision screen Hearing Screening - Comments:: Patient notes having difficulty hearing some conversational tones. Declines audiologist appointment. No hearing aids worn. Vision Screening - Comments:: Followed by [facility]  Macular degeneration Glaucoma; visits every 6 months Wears corrective lenses Cataract extraction, bilateral   Dietary issues and exercise activities discussed: Current Exercise Habits: Home exercise routine, Type of exercise: walking, Intensity: Mild Healthy diet    Goals Addressed             This Visit's Progress    Maintain Healthy Lifestyle       Follow up with pcp as needed.       Depression Screen    02/28/2022    1:42 PM 02/26/2021    2:45 PM 06/13/2020    1:45 PM 06/21/2019   10:19 AM 03/03/2018    2:57 PM  01/28/2017    3:13 PM 08/24/2015    1:53 PM  PHQ 2/9 Scores  PHQ - 2 Score 0 0 0 1 0 0 0    Fall Risk    02/28/2022    1:42 PM 02/26/2021    2:57 PM 06/13/2020    1:39 PM 10/18/2019    1:54 PM 09/29/2019    5:34 PM  Fall Risk   Falls in the past year? 1 1 1 1  0  Comment     Emmi Telephone Survey: data to providers prior to load  Number falls in past yr: 0 0 0 0   Injury with Fall? 0 0 0 0   Risk for fall due to : No Fall Risks      Follow up Falls evaluation completed Falls evaluation completed Falls evaluation completed Falls evaluation completed     FALL RISK PREVENTION PERTAINING TO THE HOME: Home free of loose throw rugs in walkways, pet beds, electrical cords, etc? Yes  Adequate lighting in your home to reduce risk of falls? Yes   ASSISTIVE DEVICES UTILIZED TO PREVENT FALLS: Use of a cane, walker or w/c? No  Grab bars in the bathroom? Yes  Shower chair or bench in shower? Yes  Elevated toilet seat or a handicapped toilet? Yes   TIMED UP AND GO: Was the test performed? Yes .  Length of time to ambulate 10 feet: 20 sec.   Gait slow and steady without use of assistive device.  Cognitive Function: Patient is alert.     07/30/2021    3:10 PM 02/26/2021    3:58 PM 01/28/2017    3:28 PM  MMSE - Mini Mental State Exam  Not completed:  Unable to complete   Orientation to time 2  5  Orientation to  Place 4  5  Registration 3  3  Attention/ Calculation 0  3  Recall 0  3  Language- name 2 objects 2  2  Language- repeat 1  1  Language- follow 3 step command 3  3  Language- read & follow direction 1  1  Write a sentence 1  1  Copy design 0  1  Total score 17  28        06/21/2019   10:25 AM 03/03/2018    2:59 PM  6CIT Screen  What Year? 0 points 0 points  What month? 0 points 0 points  What time? 0 points 0 points  Count back from 20 0 points 0 points  Months in reverse 0 points 0 points  Repeat phrase 2 points 0 points  Total Score 2 points 0 points     Immunizations Immunization History  Administered Date(s) Administered   Fluad Quad(high Dose 65+) 07/07/2019, 08/15/2020, 07/30/2021   Influenza Split 08/08/2012   Influenza, High Dose Seasonal PF 08/25/2017, 08/25/2018   Influenza,inj,Quad PF,6+ Mos 07/19/2013, 07/26/2014, 08/24/2015   Influenza-Unspecified 08/08/2012, 07/19/2013, 07/26/2014, 08/08/2016   PFIZER(Purple Top)SARS-COV-2 Vaccination 01/02/2020, 01/24/2020, 09/01/2020, 03/23/2021   Pneumococcal Conjugate-13 01/18/2014   Pneumococcal Polysaccharide-23 02/24/2017   Screening Tests Health Maintenance  Topic Date Due   COVID-19 Vaccine (5 - Booster for Pfizer series) 03/16/2022 (Originally 05/18/2021)   MAMMOGRAM  04/16/2022 (Originally 10/31/2020)   Zoster Vaccines- Shingrix (1 of 2) 05/30/2022 (Originally 11/06/1980)   TETANUS/TDAP  03/01/2023 (Originally [birth year]06/1949)   INFLUENZA VACCINE  06/04/2022   Pneumonia Vaccine 35+ Years old  Completed   DEXA SCAN  Completed   HPV VACCINES  Aged Out   Health Maintenance There are no preventive care reminders to display for this patient.  Lung Cancer Screening: (Low Dose CT Chest recommended if Age 50-80 years, 30 pack-year currently smoking OR have quit w/in 15years.) does not qualify.   Hepatitis C Screening: does not qualify.  Vision Screening: Recommended annual ophthalmology exams for early detection of glaucoma and other disorders of the eye.  Dental Screening: Recommended annual dental exams for proper oral hygiene  Community Resource Referral / Chronic Care Management: CRR required this visit?  No   CCM required this visit?  No      Plan:   Keep all routine maintenance appointments.   I have personally reviewed and noted the following in the patient's chart:   Medical and social history Use of alcohol, tobacco or illicit drugs  Current medications and supplements including opioid prescriptions.  Functional ability and status Nutritional status Physical  activity Advanced directives List of other physicians Hospitalizations, surgeries, and ER visits in previous 12 months Vitals Screenings to include cognitive, depression, and falls Referrals and appointments  In addition, I have reviewed and discussed with patient certain preventive protocols, quality metrics, and best practice recommendations. A written personalized care plan for preventive services as well as general preventive health recommendations were provided to patient.     Ashok Pall, LPN   1/61/0960

## 2022-02-28 NOTE — Patient Instructions (Addendum)
?  Sabrina Cervantes , ?Thank you for taking time to come for your Medicare Wellness Visit. I appreciate your ongoing commitment to your health goals. Please review the following plan we discussed and let me know if I can assist you in the future.  ? ?These are the goals we discussed: ? Goals   ? ?  Maintain Healthy Lifestyle   ?  Follow up with pcp as needed. ?  ? ?  ?  ?This is a list of the screening recommended for you and due dates:  ?Health Maintenance  ?Topic Date Due  ? COVID-19 Vaccine (5 - Booster for Pfizer series) 03/16/2022*  ? Mammogram  04/16/2022*  ? Zoster (Shingles) Vaccine (1 of 2) 05/30/2022*  ? Tetanus Vaccine  03/01/2023*  ? Flu Shot  06/04/2022  ? Pneumonia Vaccine  Completed  ? DEXA scan (bone density measurement)  Completed  ? HPV Vaccine  Aged Out  ?*Topic was postponed. The date shown is not the original due date.  ?  ?

## 2022-02-28 NOTE — Progress Notes (Signed)
Patient ID: Sabrina Cervantes, female   DOB: 1929/04/19, 88 y.o.   MRN: [id number] ? ? ?Subjective:  ? ? Patient ID: Sabrina Cervantes, female    DOB: 01-21-1929, 88 y.o.   MRN: [id number] ? ?This visit occurred during the SARS-CoV-2 public health emergency.  Safety protocols were in place, including screening questions prior to the visit, additional usage of staff PPE, and extensive cleaning of exam room while observing appropriate contact time as indicated for disinfecting solutions.  ? ?Patient here for a scheduled follow up.  ? ?Chief Complaint  ?Patient presents with  ? Follow-up  ?  Follow up for HLD  ? .  ? ?HPI ?She is accompanied by her daughter.  History obtained from both of them.  Has been having left knee pain.  Saw ortho.  S/p injection.  Also evaluated by hematology.  Hgb stable.  No further w/up felt warranted.  Still some intermittent allergy issues.  Discussed using nasal spray.  No chest pain or sob reported.  No abdominal pain.  Bowels moving.  Sleeping in recliner.  Swelling - lower extremities.  Some increased urinary frequency - started over the last week.   ? ? ?Past Medical History:  ?Diagnosis Date  ? Anemia   ? Anxiety   ? Arthritis   ? "left hip" (08/15/2016)  ? Basal cell carcinoma   ? forehead & nose; Cancer Center cut them off; deep cutting  ? Depression   ? GERD (gastroesophageal reflux disease)   ? Hepatitis   ? "think I had it as a child"  ? History of kidney stones   ? HOH (hard of hearing)   ? Hypercholesterolemia   ? Osteoporosis   ? Pyelonephritis, acute   ?  s/p ureteral stent  ? Vasomotor rhinitis   ? chronic  ? ?Past Surgical History:  ?Procedure Laterality Date  ? ABDOMINAL HYSTERECTOMY  1968  ? secondary to prolapse, ovaries not removed  ? BASAL CELL CARCINOMA EXCISION    ? forehead & nose; Cancer Center cut them off; deep cutting  ? CATARACT EXTRACTION, BILATERAL Bilateral   ? COLONOSCOPY    ? DILATION AND CURETTAGE OF UTERUS    ? S/P miscarriage  ? LITHOTRIPSY    ? Dr Yves Dill, nephrolithiasis   ? PARATHYROIDECTOMY Left 08/15/2016  ?  inferior minimally invasive Archie Endo 08/15/2016  ? PARATHYROIDECTOMY Left 08/15/2016  ? Procedure: LEFT INFERIOR PARATHYROIDECTOMY;  Surgeon: Armandina Gemma, MD;  Location: [place];  Service: General;  Laterality: Left;  ? RETINAL DETACHMENT SURGERY    ? URETERAL STENT PLACEMENT    ? ?Family History  ?Problem Relation Age of Onset  ? Skin cancer Father   ? Prostate cancer Father   ? Colon cancer Brother   ? Prostate cancer Brother   ? Hypertension Sister   ? Skin cancer Sister   ? Diabetes Mellitus II Sister   ? Breast cancer Sister 59  ? Breast cancer Other   ?     niece  ? ?Social History  ? ?Socioeconomic History  ? Marital status: Widowed  ?  Spouse name: Not on file  ? Number of children: 4  ? Years of education: Not on file  ? Highest education level: Not on file  ?Occupational History  ? Not on file  ?Tobacco Use  ? Smoking status: Never  ? Smokeless tobacco: Never  ?Substance and Sexual Activity  ? Alcohol use: No  ?  Alcohol/week: 0.0 standard drinks  ? Drug use: No  ?  Sexual activity: Never  ?Other Topics Concern  ? Not on file  ?Social History Narrative  ? Not on file  ? ?Social Determinants of Health  ? ?Financial Resource Strain: Low Risk   ? Difficulty of Paying Living Expenses: Not hard at all  ?Food Insecurity: No Food Insecurity  ? Worried About Charity fundraiser in the Last Year: Never true  ? Ran Out of Food in the Last Year: Never true  ?Transportation Needs: No Transportation Needs  ? Lack of Transportation (Medical): No  ? Lack of Transportation (Non-Medical): No  ?Physical Activity: Not on file  ?Stress: No Stress Concern Present  ? Feeling of Stress : Not at all  ?Social Connections: Unknown  ? Frequency of Communication with Friends and Family: Not on file  ? Frequency of Social Gatherings with Friends and Family: More than three times a week  ? Attends Religious Services: Not on file  ? Active Member of Clubs or Organizations: Not on file  ? Attends English as a second language teacher Meetings: Not on file  ? Marital Status: Not on file  ? ? ? ?Review of Systems  ?Constitutional:  Negative for appetite change and unexpected weight change.  ?HENT:  Negative for congestion and sinus pressure.   ?Respiratory:  Negative for cough, chest tightness and shortness of breath.   ?Cardiovascular:  Positive for leg swelling. Negative for chest pain and palpitations.  ?Gastrointestinal:  Negative for abdominal pain, diarrhea, nausea and vomiting.  ?Genitourinary:  Positive for frequency. Negative for difficulty urinating.  ?Musculoskeletal:  Negative for myalgias.  ?     Knee pain as outlined.    ?Skin:  Negative for color change and rash.  ?Neurological:  Negative for dizziness, light-headedness and headaches.  ?Psychiatric/Behavioral:  Negative for agitation and dysphoric mood.   ? ?   ?Objective:  ?  ? ?BP 120/68 (BP Location: Left Arm, Patient Position: Sitting, Cuff Size: Small)   Pulse 64   Temp (!) 97.5 ?F (36.4 ?C) (Temporal)   Resp 14   Ht '5\' 3"'$  (1.6 m)   Wt 136 lb (61.7 kg)   SpO2 98%   BMI 24.09 kg/m?  ?Wt Readings from Last 3 Encounters:  ?02/28/22 136 lb (61.7 kg)  ?02/28/22 136 lb (61.7 kg)  ?11/27/21 134 lb 3.2 oz (60.9 kg)  ? ? ?Physical Exam ?Vitals reviewed.  ?Constitutional:   ?   General: She is not in acute distress. ?   Appearance: Normal appearance.  ?HENT:  ?   Head: Normocephalic and atraumatic.  ?   Right Ear: External ear normal.  ?   Left Ear: External ear normal.  ?Eyes:  ?   General: No scleral icterus.    ?   Right eye: No discharge.     ?   Left eye: No discharge.  ?   Conjunctiva/sclera: Conjunctivae normal.  ?Neck:  ?   Thyroid: No thyromegaly.  ?Cardiovascular:  ?   Rate and Rhythm: Normal rate and regular rhythm.  ?Pulmonary:  ?   Effort: No respiratory distress.  ?   Breath sounds: Normal breath sounds. No wheezing.  ?Abdominal:  ?   General: Bowel sounds are normal.  ?   Palpations: Abdomen is soft.  ?   Tenderness: There is no abdominal tenderness.   ?Musculoskeletal:     ?   General: No tenderness.  ?   Cervical back: Neck supple. No tenderness.  ?   Comments: Increased lower extremity swelling.   ?Lymphadenopathy:  ?  Cervical: No cervical adenopathy.  ?Skin: ?   Findings: No erythema or rash.  ?Neurological:  ?   Mental Status: She is alert.  ?Psychiatric:     ?   Mood and Affect: Mood normal.     ?   Behavior: Behavior normal.  ? ? ? ?Outpatient Encounter Medications as of 02/28/2022  ?Medication Sig  ? Calcium Carbonate-Vitamin D 600-400 MG-UNIT tablet Take 1 tablet by mouth 2 (two) times daily.  ? Cholecalciferol (VITAMIN D-3) 1000 UNITS CAPS Take 1,000 Units by mouth daily.   ? CRANBERRY PO Take 1 tablet by mouth daily.  ? fish oil-omega-3 fatty acids 1000 MG capsule Take 1 g by mouth daily.  ? ipratropium (ATROVENT) 0.03 % nasal spray Place 1 spray into both nostrils every 12 (twelve) hours.  ? Multiple Vitamins-Iron (MULTIVITAMINS WITH IRON) TABS tablet Take 1 tablet by mouth daily.  ? mupirocin ointment (BACTROBAN) 2 % Apply to affected area bid  ? OVER THE COUNTER MEDICATION Place 1 drop into both eyes daily as needed (for dry eyes).  ? simvastatin (ZOCOR) 40 MG tablet Take 1 tablet (40 mg total) by mouth every evening.  ? ?Facility-Administered Encounter Medications as of 02/28/2022  ?Medication  ? cyanocobalamin ((VITAMIN B-12)) injection 1,000 mcg  ? cyanocobalamin ((VITAMIN B-12)) injection 1,000 mcg  ?  ? ?Lab Results  ?Component Value Date  ? WBC 9.3 02/28/2022  ? HGB 10.9 (L) 02/28/2022  ? HCT 32.3 (L) 02/28/2022  ? PLT 332.0 02/28/2022  ? GLUCOSE 92 02/28/2022  ? CHOL 164 02/28/2022  ? TRIG 105.0 02/28/2022  ? HDL 57.90 02/28/2022  ? LDLDIRECT 78.0 04/30/2018  ? [place] 85 02/28/2022  ? ALT 6 02/28/2022  ? AST 11 02/28/2022  ? NA 139 02/28/2022  ? K 3.9 02/28/2022  ? CL 104 02/28/2022  ? CREATININE 0.84 02/28/2022  ? BUN 22 02/28/2022  ? CO2 28 02/28/2022  ? TSH 3.61 02/28/2022  ? ? ?US Venous Img Lower Unilateral Left ? ?Result Date:  10/01/2021 ?CLINICAL DATA:  Left lower extremity pain and swelling. EXAM: Left LOWER EXTREMITY VENOUS DOPPLER ULTRASOUND TECHNIQUE: Gray-scale sonography with compression, as well as color and duplex ultrasound, we

## 2022-03-01 LAB — CBC WITH DIFFERENTIAL/PLATELET
Basophils Absolute: 0 10*3/uL (ref 0.0–0.1)
Basophils Relative: 0.4 % (ref 0.0–3.0)
Eosinophils Absolute: 0.1 10*3/uL (ref 0.0–0.7)
Eosinophils Relative: 1.3 % (ref 0.0–5.0)
HCT: 32.3 % — ABNORMAL LOW (ref 36.0–46.0)
Hemoglobin: 10.9 g/dL — ABNORMAL LOW (ref 12.0–15.0)
Lymphocytes Relative: 20.9 % (ref 12.0–46.0)
Lymphs Abs: 2 10*3/uL (ref 0.7–4.0)
MCHC: 33.7 g/dL (ref 30.0–36.0)
MCV: 89.1 fl (ref 78.0–100.0)
Monocytes Absolute: 0.8 10*3/uL (ref 0.1–1.0)
Monocytes Relative: 8.8 % (ref 3.0–12.0)
Neutro Abs: 6.4 10*3/uL (ref 1.4–7.7)
Neutrophils Relative %: 68.6 % (ref 43.0–77.0)
Platelets: 332 10*3/uL (ref 150.0–400.0)
RBC: 3.63 Mil/uL — ABNORMAL LOW (ref 3.87–5.11)
RDW: 14.6 % (ref 11.5–15.5)
WBC: 9.3 10*3/uL (ref 4.0–10.5)

## 2022-03-01 LAB — BASIC METABOLIC PANEL
BUN: 22 mg/dL (ref 6–23)
CO2: 28 mEq/L (ref 19–32)
Calcium: 9.1 mg/dL (ref 8.4–10.5)
Chloride: 104 mEq/L (ref 96–112)
Creatinine, Ser: 0.84 mg/dL (ref 0.40–1.20)
GFR: 60.8 mL/min (ref >60.00)
Glucose, Bld: 92 mg/dL (ref 70–99)
Potassium: 3.9 mEq/L (ref 3.5–5.1)
Sodium: 139 mEq/L (ref 135–145)

## 2022-03-01 LAB — LIPID PANEL
Cholesterol: 164 mg/dL (ref 0–200)
HDL: 57.9 mg/dL (ref >39.00)
LDL Cholesterol: 85 mg/dL (ref 0–99)
NonHDL: 106.38
Total CHOL/HDL Ratio: 3
Triglycerides: 105 mg/dL (ref 0.0–149.0)
VLDL: 21 mg/dL (ref 0.0–40.0)

## 2022-03-01 LAB — URINALYSIS, ROUTINE W REFLEX MICROSCOPIC
Bilirubin Urine: NEGATIVE
Ketones, ur: NEGATIVE
Nitrite: POSITIVE — AB
Specific Gravity, Urine: 1.02 (ref 1.000–1.030)
Urine Glucose: NEGATIVE
Urobilinogen, UA: 0.2 (ref 0.0–1.0)
pH: 6 (ref 5.0–8.0)

## 2022-03-01 LAB — HEPATIC FUNCTION PANEL
ALT: 6 U/L (ref 0–35)
AST: 11 U/L (ref 0–37)
Albumin: 4 g/dL (ref 3.5–5.2)
Alkaline Phosphatase: 114 U/L (ref 39–117)
Bilirubin, Direct: 0.1 mg/dL (ref 0.0–0.3)
Total Bilirubin: 0.5 mg/dL (ref 0.2–1.2)
Total Protein: 5.7 g/dL — ABNORMAL LOW (ref 6.0–8.3)

## 2022-03-01 LAB — TSH: TSH: 3.61 u[IU]/mL (ref 0.35–5.50)

## 2022-03-01 LAB — FERRITIN: Ferritin: 67 ng/mL (ref 10.0–291.0)

## 2022-03-01 LAB — VITAMIN D 25 HYDROXY (VIT D DEFICIENCY, FRACTURES): VITD: 55.92 ng/mL (ref 30.00–100.00)

## 2022-03-04 ENCOUNTER — Encounter: Payer: Self-pay | Admitting: Internal Medicine

## 2022-03-04 DIAGNOSIS — M7989 Other specified soft tissue disorders: Secondary | ICD-10-CM | POA: Insufficient documentation

## 2022-03-04 MED ORDER — CEFDINIR 300 MG PO CAPS: 300.0000 mg | ORAL_CAPSULE | Freq: Two times a day (BID) | ORAL | 0 refills | Status: DC

## 2022-03-04 NOTE — Assessment & Plan Note (Signed)
Has declined prescription treatment.  Continue calcium, vitamin D and weight bearing exercise.  Have discussed due bone density.  Never scheduled.  Need to schedule.   ?

## 2022-03-04 NOTE — Assessment & Plan Note (Signed)
Persistent/chronic symptoms.  Have discussed taking antihistamine and using nasal spray.  Follow.  ?

## 2022-03-04 NOTE — Assessment & Plan Note (Signed)
On simvastatin.  Low cholesterol diet and exercise.  Follow lipid panel and liver function tests.   

## 2022-03-04 NOTE — Assessment & Plan Note (Signed)
Bilateral lower extremity swelling.  Sleeps in recliner.  Discussed leg elevation.  Has tried compression hose.  Worsens.  Refer to [facility] for further evaluation and treatment.   ?

## 2022-03-04 NOTE — Addendum Note (Signed)
Addended by: Kerin Salen R on: 03/04/2022 11:02 AM ? ? Modules accepted: Orders ? ?

## 2022-03-04 NOTE — Assessment & Plan Note (Signed)
Saw hematology.  Previous attempted iron infusion - had reaction.  She continues with multivitamin with iron.  Recent f/u.  Stable.  No further w/up. Follow cbc and iron studies.  

## 2022-03-04 NOTE — Assessment & Plan Note (Signed)
Continue zoloft.  Stable.  

## 2022-03-04 NOTE — Assessment & Plan Note (Signed)
Upper symptoms controlled.  On protonix.  

## 2022-03-04 NOTE — Assessment & Plan Note (Signed)
Check urine to confirm no infection.  

## 2022-03-04 NOTE — Assessment & Plan Note (Signed)
S/p parathyroidectomy.  Follow calcium.  ?

## 2022-03-07 ENCOUNTER — Ambulatory Visit (INDEPENDENT_AMBULATORY_CARE_PROVIDER_SITE_OTHER): Payer: Medicare Other

## 2022-03-07 DIAGNOSIS — E538 Deficiency of other specified B group vitamins: Secondary | ICD-10-CM

## 2022-03-07 MED ORDER — CYANOCOBALAMIN 1000 MCG/ML IJ SOLN
1000.0000 ug | Freq: Once | INTRAMUSCULAR | Status: AC
  Administered 2022-03-07: 1000 ug via INTRAMUSCULAR

## 2022-03-07 NOTE — Progress Notes (Signed)
Patient presented today for b-12 injection. IM, left deltoid. Patient tolerated injection well. ?

## 2022-03-29 ENCOUNTER — Encounter: Payer: Self-pay | Admitting: Intensive Care

## 2022-03-29 ENCOUNTER — Emergency Department
Admission: EM | Admit: 2022-03-29 | Discharge: 2022-03-29 | Disposition: A | Discharge status: Home / Self Care | Payer: Medicare Other | Attending: Emergency Medicine | Admitting: Emergency Medicine

## 2022-03-29 ENCOUNTER — Other Ambulatory Visit: Payer: Self-pay

## 2022-03-29 ENCOUNTER — Emergency Department: Payer: Medicare Other

## 2022-03-29 DIAGNOSIS — R262 Difficulty in walking, not elsewhere classified: Secondary | ICD-10-CM | POA: Diagnosis not present

## 2022-03-29 DIAGNOSIS — R600 Localized edema: Secondary | ICD-10-CM | POA: Diagnosis not present

## 2022-03-29 DIAGNOSIS — N390 Urinary tract infection, site not specified: Secondary | ICD-10-CM | POA: Insufficient documentation

## 2022-03-29 DIAGNOSIS — M25551 Pain in right hip: Secondary | ICD-10-CM | POA: Diagnosis not present

## 2022-03-29 DIAGNOSIS — R4182 Altered mental status, unspecified: Secondary | ICD-10-CM | POA: Diagnosis present

## 2022-03-29 LAB — URINALYSIS, ROUTINE W REFLEX MICROSCOPIC
Bilirubin Urine: NEGATIVE
Glucose, UA: NEGATIVE mg/dL
Ketones, ur: NEGATIVE mg/dL
Nitrite: POSITIVE — AB
Protein, ur: NEGATIVE mg/dL
Specific Gravity, Urine: 1.017 (ref 1.005–1.030)
pH: 5 (ref 5.0–8.0)

## 2022-03-29 LAB — COMPREHENSIVE METABOLIC PANEL
ALT: 12 U/L (ref 0–44)
AST: 16 U/L (ref 15–41)
Albumin: 4.1 g/dL (ref 3.5–5.0)
Alkaline Phosphatase: 119 U/L (ref 38–126)
Anion gap: 7 (ref 5–15)
BUN: 20 mg/dL (ref 8–23)
CO2: 28 mmol/L (ref 22–32)
Calcium: 11.1 mg/dL — ABNORMAL HIGH (ref 8.9–10.3)
Chloride: 105 mmol/L (ref 98–111)
Creatinine, Ser: 0.85 mg/dL (ref 0.44–1.00)
GFR, Estimated: >60 mL/min (ref >60)
Glucose, Bld: 104 mg/dL — ABNORMAL HIGH (ref 70–99)
Potassium: 4 mmol/L (ref 3.5–5.1)
Sodium: 140 mmol/L (ref 135–145)
Total Bilirubin: 0.8 mg/dL (ref 0.3–1.2)
Total Protein: 7.2 g/dL (ref 6.5–8.1)

## 2022-03-29 LAB — CBC WITH DIFFERENTIAL/PLATELET
Abs Immature Granulocytes: 0.08 10*3/uL — ABNORMAL HIGH (ref 0.00–0.07)
Basophils Absolute: 0.1 10*3/uL (ref 0.0–0.1)
Basophils Relative: 1 %
Eosinophils Absolute: 0.1 10*3/uL (ref 0.0–0.5)
Eosinophils Relative: 1 %
HCT: 38.5 % (ref 36.0–46.0)
Hemoglobin: 11.9 g/dL — ABNORMAL LOW (ref 12.0–15.0)
Immature Granulocytes: 1 %
Lymphocytes Relative: 25 %
Lymphs Abs: 2.4 10*3/uL (ref 0.7–4.0)
MCH: 28.5 pg (ref 26.0–34.0)
MCHC: 30.9 g/dL (ref 30.0–36.0)
MCV: 92.3 fL (ref 80.0–100.0)
Monocytes Absolute: 0.7 10*3/uL (ref 0.1–1.0)
Monocytes Relative: 7 %
Neutro Abs: 6.1 10*3/uL (ref 1.7–7.7)
Neutrophils Relative %: 65 %
Platelets: 370 10*3/uL (ref 150–400)
RBC: 4.17 MIL/uL (ref 3.87–5.11)
RDW: 14.9 % (ref 11.5–15.5)
WBC: 9.5 10*3/uL (ref 4.0–10.5)
nRBC: 0 % (ref 0.0–0.2)

## 2022-03-29 LAB — BRAIN NATRIURETIC PEPTIDE: B Natriuretic Peptide: 24.9 pg/mL (ref 0.0–100.0)

## 2022-03-29 MED ORDER — CEPHALEXIN 500 MG PO CAPS
500.0000 mg | ORAL_CAPSULE | Freq: Once | ORAL | Status: AC
Start: 2022-03-29 — End: 2022-03-29
  Administered 2022-03-29: 500 mg via ORAL
  Filled 2022-03-29: qty 1

## 2022-03-29 MED ORDER — CEPHALEXIN 500 MG PO CAPS: 500.0000 mg | ORAL_CAPSULE | Freq: Three times a day (TID) | ORAL | 0 refills | Status: AC

## 2022-03-29 NOTE — ED Provider Notes (Signed)
[facility] Emergency Department Provider Note     Event Date/Time   First MD Initiated Contact with Patient 03/29/22 1446     (approximate)   History   Leg Pain and Urinary Tract Infection   HPI  Sabrina Cervantes is a 88 y.o. female presents to the ED accompanied by her daughter, for evaluation of groin/thigh pain as well as some urinary frequency.  Patient has had decreased ambulation since yesterday.  This is a decline from her baseline when she typically ambulates independently.  No reported fever, nausea, vomiting, diarrhea.  She has had recurrent urinary tract infections in the past.  Patient also would verbalize some intermittent reports of pain to the legs bilaterally.  The daughter notes that the patient has a history of peripheral edema, and is awaiting evaluation by vascular at this time.  She denies any recent injury by fall, but notes the patient has had a fall in the past and did not seek care.  Patient reports some pain intermittently during evaluation to the right thigh as well as the left knee.  It is unclear based on the patient's baseline dementia whether the pain complaints are related to the edema or some acute injury.   Physical Exam   Triage Vital Signs: ED Triage Vitals  Enc Vitals Group     BP 03/29/22 1312 129/75     Pulse Rate 03/29/22 1312 79     Resp 03/29/22 1312 16     Temp 03/29/22 1312 98.7 F (37.1 C)     Temp Source 03/29/22 1312 Oral     SpO2 03/29/22 1312 95 %     Weight 03/29/22 1300 133 lb (60.3 kg)     Height 03/29/22 1300 5' (1.524 m)     Head Circumference --      Peak Flow --      Pain Score --      Pain Loc --      Pain Edu? --      Excl. in [place]? --     Most recent vital signs: Vitals:   03/29/22 1312 03/29/22 1802  BP: 129/75 126/79  Pulse: 79 78  Resp: 16 17  Temp: 98.7 F (37.1 C)   SpO2: 95% 96%    General Awake, no distress.  HEENT NCAT. PERRL. EOMI. No rhinorrhea. Mucous membranes are moist.   CV:  Good peripheral perfusion.  RESP:  Normal effort.  ABD:  No distention.  MSK:  Patient with pitting edema bilaterally of the lower extremities from the feet to the knees.  Patient is able to demonstrate normal active range of motion to the bilateral lower extremities.  Intermittent active and passive range of motion with elicit subjective complaints of pain.  Patient at other times when distracted, does not endorse any pain to the lower extremities.   ED Results / Procedures / Treatments   Labs (all labs ordered are listed, but only abnormal results are displayed) Labs Reviewed  URINALYSIS, ROUTINE W REFLEX MICROSCOPIC - Abnormal; Notable for the following components:      Result Value   Color, Urine YELLOW (*)    APPearance HAZY (*)    Hgb urine dipstick SMALL (*)    Nitrite POSITIVE (*)    Leukocytes,Ua SMALL (*)    Bacteria, UA FEW (*)    All other components within normal limits  CBC WITH DIFFERENTIAL/PLATELET - Abnormal; Notable for the following components:   Hemoglobin 11.9 (*)  Abs Immature Granulocytes 0.08 (*)    All other components within normal limits  COMPREHENSIVE METABOLIC PANEL - Abnormal; Notable for the following components:   Glucose, Bld 104 (*)    Calcium 11.1 (*)    All other components within normal limits  BRAIN NATRIURETIC PEPTIDE     EKG   RADIOLOGY  I personally viewed and evaluated these images as part of my medical decision making, as well as reviewing the written report by the radiologist.  ED Provider Interpretation: no acute findings}   DG Right Hip with Pelvis  IMPRESSION: Negative.   PROCEDURES:  Critical Care performed: No  Procedures   MEDICATIONS ORDERED IN ED: Medications  cephALEXin (KEFLEX) capsule 500 mg (500 mg Oral Given 03/29/22 1540)     IMPRESSION / MDM / ASSESSMENT AND PLAN / ED COURSE  I reviewed the triage vital signs and the nursing notes.                              Differential diagnosis  includes, but is not limited to, hip fracture, UTI, electrolyte abnormality, acute CHF  Geriatric patient to the ED accompanied by her adult daughter, for evaluation of some altered mental status and decreased ambulation.  Daughter voices some concern for possible UTI as well as some concern for possible acute injury to the right hip.  Patient is evaluated for complaints, found to have a overall reassuring work-up.  No acute electrolyte abnormality, critical anemia, leukocytosis.  No elevated BNP to indicate CHF or overload of the heart.  Patient with you a evidence of an acute UTI including nitrites and bacteriuria.  No radiologic evidence of any acute hip fracture on the right.  Patient's diagnosis is consistent with UTI. Patient will be discharged home with prescriptions for Keflex. Patient is to follow up with primary provider as needed or otherwise directed. Patient is given ED precautions to return to the ED for any worsening or new symptoms.   FINAL CLINICAL IMPRESSION(S) / ED DIAGNOSES   Final diagnoses:  Lower urinary tract infectious disease     Rx / DC Orders   ED Discharge Orders          Ordered    cephALEXin (KEFLEX) 500 MG capsule  3 times daily        03/29/22 1712             Note:  This document was prepared using Dragon voice recognition software and may include unintentional dictation errors.    Melvenia Needles, PA-C 04/01/22 2348    Harvest Dark, MD 04/02/22 [phone]

## 2022-03-29 NOTE — Discharge Instructions (Addendum)
Give the antibiotic as directed. Follow-up with Dr. Nicki Reaper for ongoing symptoms.

## 2022-03-29 NOTE — ED Triage Notes (Signed)
Patient c/o right groin pain and urinary frequency. Patient has some swelling to knees that daughter reports is normal due to arthritis. Reports she has not been able to ambulate like normal since yesterday. Baseline ambulates independently but needed assistance with walking through the night/today

## 2022-04-02 ENCOUNTER — Emergency Department (HOSPITAL_COMMUNITY): Payer: Medicare Other

## 2022-04-02 ENCOUNTER — Inpatient Hospital Stay (HOSPITAL_COMMUNITY): Payer: Medicare Other

## 2022-04-02 ENCOUNTER — Other Ambulatory Visit: Payer: Self-pay

## 2022-04-02 ENCOUNTER — Observation Stay (HOSPITAL_COMMUNITY)
Admission: EM | Admit: 2022-04-02 | Discharge: 2022-04-03 | Disposition: A | Discharge status: Home Health | Payer: Medicare Other | Attending: Internal Medicine | Admitting: Internal Medicine

## 2022-04-02 ENCOUNTER — Encounter (HOSPITAL_COMMUNITY): Payer: Self-pay | Admitting: Internal Medicine

## 2022-04-02 ENCOUNTER — Telehealth: Payer: Self-pay | Admitting: Internal Medicine

## 2022-04-02 DIAGNOSIS — M25551 Pain in right hip: Secondary | ICD-10-CM | POA: Insufficient documentation

## 2022-04-02 DIAGNOSIS — E43 Unspecified severe protein-calorie malnutrition: Secondary | ICD-10-CM | POA: Diagnosis not present

## 2022-04-02 DIAGNOSIS — R609 Edema, unspecified: Secondary | ICD-10-CM | POA: Insufficient documentation

## 2022-04-02 DIAGNOSIS — S300XXA Contusion of lower back and pelvis, initial encounter: Secondary | ICD-10-CM | POA: Insufficient documentation

## 2022-04-02 DIAGNOSIS — R2689 Other abnormalities of gait and mobility: Secondary | ICD-10-CM | POA: Insufficient documentation

## 2022-04-02 DIAGNOSIS — M85852 Other specified disorders of bone density and structure, left thigh: Secondary | ICD-10-CM | POA: Diagnosis not present

## 2022-04-02 DIAGNOSIS — G319 Degenerative disease of nervous system, unspecified: Secondary | ICD-10-CM | POA: Diagnosis not present

## 2022-04-02 DIAGNOSIS — S32511A Fracture of superior rim of right pubis, initial encounter for closed fracture: Secondary | ICD-10-CM | POA: Diagnosis not present

## 2022-04-02 DIAGNOSIS — Z79899 Other long term (current) drug therapy: Secondary | ICD-10-CM | POA: Diagnosis not present

## 2022-04-02 DIAGNOSIS — B965 Pseudomonas (aeruginosa) (mallei) (pseudomallei) as the cause of diseases classified elsewhere: Secondary | ICD-10-CM | POA: Insufficient documentation

## 2022-04-02 DIAGNOSIS — R600 Localized edema: Secondary | ICD-10-CM | POA: Insufficient documentation

## 2022-04-02 DIAGNOSIS — Z90710 Acquired absence of both cervix and uterus: Secondary | ICD-10-CM | POA: Diagnosis not present

## 2022-04-02 DIAGNOSIS — M79605 Pain in left leg: Secondary | ICD-10-CM

## 2022-04-02 DIAGNOSIS — M1612 Unilateral primary osteoarthritis, left hip: Secondary | ICD-10-CM | POA: Diagnosis not present

## 2022-04-02 DIAGNOSIS — F0390 Unspecified dementia without behavioral disturbance: Secondary | ICD-10-CM | POA: Diagnosis present

## 2022-04-02 DIAGNOSIS — R300 Dysuria: Secondary | ICD-10-CM | POA: Diagnosis not present

## 2022-04-02 DIAGNOSIS — Z8249 Family history of ischemic heart disease and other diseases of the circulatory system: Secondary | ICD-10-CM | POA: Insufficient documentation

## 2022-04-02 DIAGNOSIS — M79604 Pain in right leg: Secondary | ICD-10-CM

## 2022-04-02 DIAGNOSIS — M8588 Other specified disorders of bone density and structure, other site: Secondary | ICD-10-CM | POA: Diagnosis not present

## 2022-04-02 DIAGNOSIS — R410 Disorientation, unspecified: Secondary | ICD-10-CM | POA: Diagnosis not present

## 2022-04-02 DIAGNOSIS — K5730 Diverticulosis of large intestine without perforation or abscess without bleeding: Secondary | ICD-10-CM | POA: Diagnosis not present

## 2022-04-02 DIAGNOSIS — I6782 Cerebral ischemia: Secondary | ICD-10-CM | POA: Insufficient documentation

## 2022-04-02 DIAGNOSIS — S0990XA Unspecified injury of head, initial encounter: Secondary | ICD-10-CM | POA: Insufficient documentation

## 2022-04-02 DIAGNOSIS — M160 Bilateral primary osteoarthritis of hip: Secondary | ICD-10-CM | POA: Diagnosis not present

## 2022-04-02 DIAGNOSIS — W19XXXA Unspecified fall, initial encounter: Secondary | ICD-10-CM | POA: Diagnosis not present

## 2022-04-02 DIAGNOSIS — M25552 Pain in left hip: Secondary | ICD-10-CM | POA: Insufficient documentation

## 2022-04-02 DIAGNOSIS — R7989 Other specified abnormal findings of blood chemistry: Secondary | ICD-10-CM | POA: Insufficient documentation

## 2022-04-02 DIAGNOSIS — S32501A Unspecified fracture of right pubis, initial encounter for closed fracture: Secondary | ICD-10-CM | POA: Diagnosis not present

## 2022-04-02 DIAGNOSIS — N390 Urinary tract infection, site not specified: Secondary | ICD-10-CM | POA: Diagnosis not present

## 2022-04-02 DIAGNOSIS — Z66 Do not resuscitate: Secondary | ICD-10-CM | POA: Diagnosis not present

## 2022-04-02 DIAGNOSIS — M6281 Muscle weakness (generalized): Secondary | ICD-10-CM | POA: Insufficient documentation

## 2022-04-02 DIAGNOSIS — S32599A Other specified fracture of unspecified pubis, initial encounter for closed fracture: Secondary | ICD-10-CM | POA: Diagnosis present

## 2022-04-02 DIAGNOSIS — Z9181 History of falling: Secondary | ICD-10-CM | POA: Insufficient documentation

## 2022-04-02 DIAGNOSIS — Z85828 Personal history of other malignant neoplasm of skin: Secondary | ICD-10-CM | POA: Diagnosis not present

## 2022-04-02 DIAGNOSIS — Z87440 Personal history of urinary (tract) infections: Secondary | ICD-10-CM | POA: Diagnosis not present

## 2022-04-02 DIAGNOSIS — E7800 Pure hypercholesterolemia, unspecified: Secondary | ICD-10-CM | POA: Diagnosis present

## 2022-04-02 DIAGNOSIS — R2681 Unsteadiness on feet: Secondary | ICD-10-CM | POA: Insufficient documentation

## 2022-04-02 DIAGNOSIS — E892 Postprocedural hypoparathyroidism: Secondary | ICD-10-CM | POA: Diagnosis not present

## 2022-04-02 DIAGNOSIS — Z6825 Body mass index (BMI) 25.0-25.9, adult: Secondary | ICD-10-CM | POA: Insufficient documentation

## 2022-04-02 DIAGNOSIS — R102 Pelvic and perineal pain: Secondary | ICD-10-CM | POA: Diagnosis not present

## 2022-04-02 DIAGNOSIS — F03B0 Unspecified dementia, moderate, without behavioral disturbance, psychotic disturbance, mood disturbance, and anxiety: Secondary | ICD-10-CM | POA: Diagnosis not present

## 2022-04-02 DIAGNOSIS — S32591K Other specified fracture of right pubis, subsequent encounter for fracture with nonunion: Principal | ICD-10-CM

## 2022-04-02 DIAGNOSIS — S32591A Other specified fracture of right pubis, initial encounter for closed fracture: Secondary | ICD-10-CM | POA: Diagnosis not present

## 2022-04-02 DIAGNOSIS — R5383 Other fatigue: Secondary | ICD-10-CM | POA: Diagnosis not present

## 2022-04-02 LAB — URINALYSIS, ROUTINE W REFLEX MICROSCOPIC
Bilirubin Urine: NEGATIVE
Glucose, UA: NEGATIVE mg/dL
Hgb urine dipstick: NEGATIVE
Ketones, ur: NEGATIVE mg/dL
Nitrite: POSITIVE — AB
Protein, ur: NEGATIVE mg/dL
Specific Gravity, Urine: 1.008 (ref 1.005–1.030)
pH: 8 (ref 5.0–8.0)

## 2022-04-02 LAB — CBC WITH DIFFERENTIAL/PLATELET
Abs Immature Granulocytes: 0.04 10*3/uL (ref 0.00–0.07)
Basophils Absolute: 0.1 10*3/uL (ref 0.0–0.1)
Basophils Relative: 1 %
Eosinophils Absolute: 0.1 10*3/uL (ref 0.0–0.5)
Eosinophils Relative: 2 %
HCT: 33.1 % — ABNORMAL LOW (ref 36.0–46.0)
Hemoglobin: 10.4 g/dL — ABNORMAL LOW (ref 12.0–15.0)
Immature Granulocytes: 1 %
Lymphocytes Relative: 29 %
Lymphs Abs: 2.2 10*3/uL (ref 0.7–4.0)
MCH: 29.4 pg (ref 26.0–34.0)
MCHC: 31.4 g/dL (ref 30.0–36.0)
MCV: 93.5 fL (ref 80.0–100.0)
Monocytes Absolute: 0.5 10*3/uL (ref 0.1–1.0)
Monocytes Relative: 7 %
Neutro Abs: 4.5 10*3/uL (ref 1.7–7.7)
Neutrophils Relative %: 60 %
Platelets: 302 10*3/uL (ref 150–400)
RBC: 3.54 MIL/uL — ABNORMAL LOW (ref 3.87–5.11)
RDW: 15 % (ref 11.5–15.5)
WBC: 7.4 10*3/uL (ref 4.0–10.5)
nRBC: 0 % (ref 0.0–0.2)

## 2022-04-02 LAB — BASIC METABOLIC PANEL
Anion gap: 5 (ref 5–15)
BUN: 14 mg/dL (ref 8–23)
CO2: 27 mmol/L (ref 22–32)
Calcium: 9.4 mg/dL (ref 8.9–10.3)
Chloride: 108 mmol/L (ref 98–111)
Creatinine, Ser: 0.88 mg/dL (ref 0.44–1.00)
GFR, Estimated: >60 mL/min (ref >60)
Glucose, Bld: 106 mg/dL — ABNORMAL HIGH (ref 70–99)
Potassium: 3.6 mmol/L (ref 3.5–5.1)
Sodium: 140 mmol/L (ref 135–145)

## 2022-04-02 LAB — BRAIN NATRIURETIC PEPTIDE: B Natriuretic Peptide: 20.5 pg/mL (ref 0.0–100.0)

## 2022-04-02 MED ORDER — METHOCARBAMOL 500 MG PO TABS
500.0000 mg | ORAL_TABLET | Freq: Four times a day (QID) | ORAL | Status: DC | PRN
  Filled 2022-04-02: qty 1

## 2022-04-02 MED ORDER — IPRATROPIUM BROMIDE 0.06 % NA SOLN
1.0000 | Freq: Two times a day (BID) | NASAL | Status: DC
  Filled 2022-04-02 (×2): qty 15

## 2022-04-02 MED ORDER — BISACODYL 5 MG PO TBEC: 5.0000 mg | DELAYED_RELEASE_TABLET | Freq: Every day | ORAL | Status: DC | PRN

## 2022-04-02 MED ORDER — MORPHINE SULFATE (PF) 2 MG/ML IV SOLN: 2.0000 mg | INTRAVENOUS | Status: DC | PRN

## 2022-04-02 MED ORDER — ONDANSETRON HCL 4 MG/2ML IJ SOLN: 4.0000 mg | Freq: Four times a day (QID) | INTRAMUSCULAR | Status: DC | PRN

## 2022-04-02 MED ORDER — SIMVASTATIN 20 MG PO TABS
40.0000 mg | ORAL_TABLET | Freq: Every evening | ORAL | Status: DC
  Administered 2022-04-02: 40 mg via ORAL
  Filled 2022-04-02: qty 2

## 2022-04-02 MED ORDER — CEPHALEXIN 500 MG PO CAPS
500.0000 mg | ORAL_CAPSULE | Freq: Three times a day (TID) | ORAL | Status: DC
  Administered 2022-04-02 – 2022-04-03 (×3): 500 mg via ORAL
  Filled 2022-04-02 (×3): qty 1

## 2022-04-02 MED ORDER — ACETAMINOPHEN 325 MG PO TABS
650.0000 mg | ORAL_TABLET | Freq: Four times a day (QID) | ORAL | Status: DC | PRN
  Administered 2022-04-02 – 2022-04-03 (×2): 650 mg via ORAL
  Filled 2022-04-02 (×2): qty 2

## 2022-04-02 MED ORDER — METHOCARBAMOL 1000 MG/10ML IJ SOLN: 500.0000 mg | Freq: Four times a day (QID) | INTRAVENOUS | Status: DC | PRN

## 2022-04-02 MED ORDER — POLYETHYLENE GLYCOL 3350 17 G PO PACK: 17.0000 g | PACK | Freq: Every day | ORAL | Status: DC | PRN

## 2022-04-02 MED ORDER — OXYCODONE HCL 5 MG PO TABS
5.0000 mg | ORAL_TABLET | ORAL | Status: DC | PRN
  Administered 2022-04-03: 5 mg via ORAL
  Filled 2022-04-02: qty 1

## 2022-04-02 MED ORDER — DOCUSATE SODIUM 100 MG PO CAPS
100.0000 mg | ORAL_CAPSULE | Freq: Two times a day (BID) | ORAL | Status: DC
Start: 2022-04-02 — End: 2022-04-03
  Administered 2022-04-02 – 2022-04-03 (×3): 100 mg via ORAL
  Filled 2022-04-02 (×3): qty 1

## 2022-04-02 NOTE — ED Triage Notes (Signed)
Pt here w/ family, c/o R and L leg pain that has been getting worse x1 week. Pt has dementia and is able to ambulate independently at baseline, now is using a walker and has increased pain. Pt has swelling in bilateral legs and was recently diagnosed w/ UTI on Friday, pt has been taking cephalexin and had improvement but now has urinary frequency again.

## 2022-04-02 NOTE — Telephone Encounter (Signed)
Pt daughter would like to be called concerning pt. Pt is in bad pain with her right leg and she went to the ER on friday [phone]

## 2022-04-02 NOTE — ED Provider Triage Note (Addendum)
Emergency Medicine Provider Triage Evaluation Note  Sabrina Cervantes , a 88 y.o. female  was evaluated in triage.  Pt complains of increasing bilateral leg pain, worse on the right than the left following increased immobility.  Accompanied with significant leg swelling.  Recently had x-rays of the hip, no fracture identified.  Recently treated for UTI, was doing well bit better, and began having urinary symptoms again within the last 2 days.  Denies fever, shortness of breath, chest pain, headaches, vision changes.  Endorses increased weakness and immobility.  Hx moderate dementia, family provides most of history.  No Hx of DVT.  Not on anticoagulation.  Review of Systems  Positive:  Negative: As above  Physical Exam  BP 135/75 (BP Location: Left Arm)   Pulse 75   Temp 99.3 F (37.4 C) (Oral)   Resp 14   SpO2 99%  Gen:   Awake, no distress Resp:  Normal effort, CTAB MSK:   Moves extremities with some difficulty Other:  Tenderness to bilateral upper legs, the right more than the left.  Right upper leg tenderness focused on the medial and lateral aspects, with tenderness also behind the knee.  Lower extremity bilateral tenderness appears equal.  DP and PT pulses palpated 2+ bilaterally.  3+ pitting edema appreciated.  Abdomen soft nontender.  RRR without M/R/G.  Afebrile.  Medical Decision Making  Medically screening exam initiated at 12:14 PM.  Appropriate orders placed.  Sabrina Cervantes was informed that the remainder of the evaluation will be completed by another provider, this initial triage assessment does not replace that evaluation, and the importance of remaining in the ED until their evaluation is complete.  Labs, imaging ordered.   Prince Rome, PA-C 37/62/83 1517    Prince Rome, PA-C 61/60/73 1230

## 2022-04-02 NOTE — ED Provider Notes (Signed)
[facility] EMERGENCY DEPARTMENT Provider Note   CSN: [id number] Arrival date & time: 04/02/22  1137     History  Chief Complaint  Patient presents with   Leg Pain   Urinary Frequency    Sabrina Cervantes is a 88 y.o. female with history of dementia presenting from home in the company of her sister and her daughter with concern for a fall and right hip pain.  They report that the patient to be complaining of right hip pain for several days, difficulty bearing weight.  They are not certain whether she had fallen at the time, but did note some bruising on her buttock.  They took her to [facility] 3 days ago, where she had x-rays of the hip done, reportedly were told that there were no abnormalities, discharged home.  She was however diagnosed with a UTI at the time and started on Keflex.  Family reports that they have been needing to assist the patient with all ambulation, they have been trying to walk over the patient complains of pain whenever she bears weight on her right leg.  There have been no further falls witnessed at home since discharge from [facility].  HPI     Home Medications Prior to Admission medications   Medication Sig Start Date End Date Taking? Authorizing Provider  Calcium Carbonate-Vitamin D 600-400 MG-UNIT tablet Take 1 tablet by mouth 2 (two) times daily.    [provider]  cephALEXin (KEFLEX) 500 MG capsule Take 1 capsule (500 mg total) by mouth 3 (three) times daily for 7 days. 03/29/22 04/05/22  Menshew, Dannielle Karvonen, PA-C  Cholecalciferol (VITAMIN D-3) 1000 UNITS CAPS Take 1,000 Units by mouth daily.     [provider]  CRANBERRY PO Take 1 tablet by mouth daily.    [provider]  fish oil-omega-3 fatty acids 1000 MG capsule Take 1 g by mouth daily.    [provider]  ipratropium (ATROVENT) 0.03 % nasal spray Place 1 spray into both nostrils every 12 (twelve) hours. 01/27/20   Einar Pheasant, MD  Multiple Vitamins-Iron (MULTIVITAMINS WITH IRON) TABS tablet Take 1 tablet by mouth daily.    [provider]  mupirocin ointment (BACTROBAN) 2 % Apply to affected area bid 05/04/18   Einar Pheasant, MD  OVER THE COUNTER MEDICATION Place 1 drop into both eyes daily as needed (for dry eyes).    [provider]  simvastatin (ZOCOR) 40 MG tablet Take 1 tablet (40 mg total) by mouth every evening. 07/30/21   Einar Pheasant, MD      Allergies    Pyridium [phenazopyridine hcl]    Review of Systems   Review of Systems  Physical Exam Updated Vital Signs BP 137/69   Pulse 74   Temp 99.3 F (37.4 C) (Oral)   Resp 20   SpO2 97%  Physical Exam Constitutional:      General: She is not in acute distress. HENT:     Head: Normocephalic and atraumatic.  Eyes:     Conjunctiva/sclera: Conjunctivae normal.     Pupils: Pupils are equal, round, and reactive to light.  Cardiovascular:     Rate and Rhythm: Normal rate and regular rhythm.  Pulmonary:     Effort: Pulmonary effort is normal. No respiratory distress.  Musculoskeletal:     Comments: Pain in right hip with any motion  Skin:    General: Skin is warm and dry.  Neurological:  General: No focal deficit present.     Mental Status: She is alert. Mental status is at baseline.    ED Results / Procedures / Treatments   Labs (all labs ordered are listed, but only abnormal results are displayed) Labs Reviewed  CBC WITH DIFFERENTIAL/PLATELET - Abnormal; Notable for the following components:      Result Value   RBC 3.54 (*)    Hemoglobin 10.4 (*)    HCT 33.1 (*)    All other components within normal limits  BASIC METABOLIC PANEL - Abnormal; Notable for the following components:   Glucose, Bld 106 (*)    All other components within normal limits  BRAIN NATRIURETIC PEPTIDE  URINALYSIS, ROUTINE W REFLEX MICROSCOPIC    EKG None  Radiology DG FEMUR MIN 2 VIEWS LEFT  Result Date: 04/02/2022 CLINICAL  DATA:  z a 88 year old female presents for evaluation of bilateral hip pain for 6 months RIGHT greater than LEFT. EXAM: LEFT FEMUR 2 VIEWS COMPARISON:  Contralateral femur, hip and pelvis. FINDINGS: Mild degenerative changes about the LEFT hip. No sign of fracture of the LEFT femur. Soft tissues are unremarkable. Osteopenia. IMPRESSION: Osteopenia and degenerative changes without acute finding. Electronically Signed   By: Zetta Bills M.D.   On: 04/02/2022 13:13   DG FEMUR, MIN 2 VIEWS RIGHT  Result Date: 04/02/2022 CLINICAL DATA:  BILATERAL hip pain for 6 months RIGHT greater than LEFT EXAM: RIGHT FEMUR 2 VIEWS COMPARISON:  03/29/2022 FINDINGS: Osseous demineralization. Narrowing of RIGHT knee joint spaces. RIGHT hip joint space preserved. Femur intact without fracture or dislocation. However, new displaced fractures are identified at the RIGHT superior and inferior pubic rami. IMPRESSION: Displaced fractures of the RIGHT superior and inferior pubic rami. Intact RIGHT femur. Electronically Signed   By: Lavonia Dana M.D.   On: 04/02/2022 13:13    Procedures Procedures    Medications Ordered in ED Medications - No data to display  ED Course/ Medical Decision Making/ A&P Clinical Course as of 04/02/22 1349  Tue Apr 02, 2022  1346 Ortho consulted Hilbert Odor.  Hospitalist paged for admission. [MT]    Clinical Course User Index [MT] Tabytha Gradillas, Carola Rhine, MD                           Medical Decision Making Amount and/or Complexity of Data Reviewed Labs: ordered. Radiology: ordered.  Risk Decision regarding hospitalization.   Patient is here with right hip fracture, noted to have displaced supra and inferior pubic rami fx on right side.  No femur fracture.  She is neurovascularly intact.  While she is lying still she is fairly comfortable and in minimal pain, only has pain with ambulation or bearing weight.  She will be nonweightbearing for now until we have consulted orthopedics.   Consult placed as noted below.  Family members updated at the bedside.  Her labs are otherwise reviewed and are largely unremarkable.  Have a lower suspicion at this point for acute DVT, do not believe we need a vascular ultrasound at the moment, and therefore I have canceled this order, given the acute fracture noted on x-ray.  She does have dementia.  It i not clear if she would be a candidate for any type of operative intervention.  She typically lives at home and is ambulatory, lives with 2 of her children.  Her daughter listed as primary contact is her POA  Family have also requested CT scan of the head as  the patient "may have fallen" several weeks ago around Mother's Day, and had a bruise on her head at the time.  My suspicion for intracranial or subdural bleed is quite low, however I think this is reasonable  screening given her dementia and poor reliability as a historian.  The patient is not on blood thinners.         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

## 2022-04-02 NOTE — H&P (Signed)
History and Physical    Patient: Sabrina Cervantes IWP:[id number] DOB: 28-Mar-1929 DOA: 04/02/2022 DOS: the patient was seen and examined on 04/02/2022 PCP: Einar Pheasant, MD  Patient coming from: Home - lives with 2 sons; NOK: Daughter, [phone]   Chief Complaint: ambulatory dysfunction  HPI: Sabrina Cervantes is a 88 y.o. female with medical history significant of HLD, dementia, and hyperparathyroidism s/p parathyroidectomy presenting with leg pain and ambulatory dysfunction.  She was seen for similar symptoms on 5/26 and diagnosed with a UTI.  Thursday, she called and said "she was in a bad way."  She was in her recliner and she was hurting. They got her up to the bathroom and she couldn't stand on her own. They got a walker and helped her to the bathroom and back.  Friday, she went to the ER at [facility] and she had an xray.  Last known fall was 3-4 weeks ago, could have fallen again.  Xray was negative and she was diagnosed with UTI, seemed to do ok with a couple of days but not peeing much.  She complained of dysuria - but hard to know with dementia.  Her daughter called her PCP this AM and reported inability to get up, ongoing pain.  She was instructed to bring her back to the ER.  +frequency, small voids.    ER Course:  Lives home, ambulates.  [place] recently, unable to bear weight.  Went to [facility] and negative imaging.  Today with imaging and pubic rami fractures.  Family wants UA, head CT.     Review of Systems: As mentioned in the history of present illness. All other systems reviewed and are negative. Past Medical History:  Diagnosis Date   Anemia    Anxiety    Arthritis    "left hip" (08/15/2016)   Basal cell carcinoma    forehead & nose; [facility] cut them off; deep cutting   Depression    GERD (gastroesophageal reflux disease)    Hepatitis    "think I had it as a child"   History of kidney stones    HOH (hard of hearing)    Hypercholesterolemia    Osteoporosis    Pyelonephritis,  acute     s/p ureteral stent   Vasomotor rhinitis    chronic   Past Surgical History:  Procedure Laterality Date   ABDOMINAL HYSTERECTOMY  1968   secondary to prolapse, ovaries not removed   BASAL CELL CARCINOMA EXCISION     forehead & nose; [place] cut them off; deep cutting   CATARACT EXTRACTION, BILATERAL Bilateral    COLONOSCOPY     DILATION AND CURETTAGE OF UTERUS     S/P miscarriage   LITHOTRIPSY     Dr Yves Dill, nephrolithiasis   PARATHYROIDECTOMY Left 08/15/2016    inferior minimally invasive /notes 08/15/2016   PARATHYROIDECTOMY Left 08/15/2016   Procedure: LEFT INFERIOR PARATHYROIDECTOMY;  Surgeon: Armandina Gemma, MD;  Location: [place];  Service: General;  Laterality: Left;   RETINAL DETACHMENT SURGERY     URETERAL STENT PLACEMENT     Social History:  reports that she has never smoked. She has never used smokeless tobacco. She reports that she does not drink alcohol and does not use drugs.  Allergies  Allergen Reactions   Pyridium [Phenazopyridine Hcl] Itching and Rash    Family History  Problem Relation Age of Onset   Skin cancer Father    Prostate cancer Father    Colon cancer Brother  Prostate cancer Brother    Hypertension Sister    Skin cancer Sister    Diabetes Mellitus II Sister    Breast cancer Sister 66   Breast cancer Other        niece    Prior to Admission medications   Medication Sig Start Date End Date Taking? Authorizing Provider  Calcium Carbonate-Vitamin D 600-400 MG-UNIT tablet Take 1 tablet by mouth 2 (two) times daily.    [provider]  cephALEXin (KEFLEX) 500 MG capsule Take 1 capsule (500 mg total) by mouth 3 (three) times daily for 7 days. 03/29/22 04/05/22  Menshew, Dannielle Karvonen, PA-C  Cholecalciferol (VITAMIN D-3) 1000 UNITS CAPS Take 1,000 Units by mouth daily.     [provider]  CRANBERRY PO Take 1 tablet by mouth daily.    [provider]  fish oil-omega-3 fatty acids 1000 MG capsule Take 1 g by  mouth daily.    [provider]  ipratropium (ATROVENT) 0.03 % nasal spray Place 1 spray into both nostrils every 12 (twelve) hours. 01/27/20   Einar Pheasant, MD  Multiple Vitamins-Iron (MULTIVITAMINS WITH IRON) TABS tablet Take 1 tablet by mouth daily.    [provider]  mupirocin ointment (BACTROBAN) 2 % Apply to affected area bid 05/04/18   Einar Pheasant, MD  OVER THE COUNTER MEDICATION Place 1 drop into both eyes daily as needed (for dry eyes).    [provider]  simvastatin (ZOCOR) 40 MG tablet Take 1 tablet (40 mg total) by mouth every evening. 07/30/21   Einar Pheasant, MD    Physical Exam: Vitals:   04/02/22 1144 04/02/22 1318 04/02/22 1330 04/02/22 1345  BP: 135/75 137/69 136/65 136/65  Pulse: 75 74 72 72  Resp: 14 20 (!) 26 (!) 22  Temp: 99.3 F (37.4 C)     TempSrc: Oral     SpO2: 99% 97% 97% 99%   General:  Appears calm and comfortable and is in NAD Eyes:   EOMI, normal lids, iris ENT:   hard of hearing, grossly normal lips, mild B lateral tongue trauma, mmm; artificial dentition Neck:  no LAD, masses or thyromegaly Cardiovascular:  RRR, no m/r/g. 2-3+ chronic LE edema.  Respiratory:   CTA bilaterally with no wheezes/rales/rhonchi.  Normal respiratory effort. Abdomen:  soft, NT, ND Skin:  no rash or induration seen on limited exam Musculoskeletal:  pain with ROM, no bony abnormality Psychiatric:  blunted mood and affect, speech fluent and appropriate, AOx1-2 Neurologic:  CN 2-12 grossly intact   Radiological Exams on Admission: Independently reviewed - see discussion in A/P where applicable  DG FEMUR MIN 2 VIEWS LEFT  Result Date: 04/02/2022 CLINICAL DATA:  z a 88 year old female presents for evaluation of bilateral hip pain for 6 months RIGHT greater than LEFT. EXAM: LEFT FEMUR 2 VIEWS COMPARISON:  Contralateral femur, hip and pelvis. FINDINGS: Mild degenerative changes about the LEFT hip. No sign of fracture of the LEFT femur. Soft  tissues are unremarkable. Osteopenia. IMPRESSION: Osteopenia and degenerative changes without acute finding. Electronically Signed   By: Zetta Bills M.D.   On: 04/02/2022 13:13   DG FEMUR, MIN 2 VIEWS RIGHT  Result Date: 04/02/2022 CLINICAL DATA:  BILATERAL hip pain for 6 months RIGHT greater than LEFT EXAM: RIGHT FEMUR 2 VIEWS COMPARISON:  03/29/2022 FINDINGS: Osseous demineralization. Narrowing of RIGHT knee joint spaces. RIGHT hip joint space preserved. Femur intact without fracture or dislocation. However, new displaced fractures are identified at the RIGHT superior and  inferior pubic rami. IMPRESSION: Displaced fractures of the RIGHT superior and inferior pubic rami. Intact RIGHT femur. Electronically Signed   By: Lavonia Dana M.D.   On: 04/02/2022 13:13    EKG: not done   Labs on Admission: I have personally reviewed the available labs and imaging studies at the time of the admission.  Pertinent labs:    Unremarkable BMP BNP 20.5 WBC 7.4 Hgb 10.4 5/26 UA: small Hgb, small LE, + nitrite, few bacteria   Assessment and Plan: Principal Problem:   Closed fracture of multiple pubic rami, initial encounter ([place]) Active Problems:   Hypercholesterolemia   Dementia without behavioral disturbance (HCC)   UTI (urinary tract infection)   DNR (do not resuscitate)    Pubic Rami Fractures -Patient with baseline dementia and h/o falls but uncertain history of recent falls presenting with progressive ambulatory dysfunction -She was seen in the [facility] ER on 5/26 and hip xray was negative -She has had progressive inability to walk and persistent pain and so came here today -Imaging shows preserved femurs but R superior and inferior rami fractures -Unfortunately, this is generally conservatively managed with pain control and PT/OT -She may require SNF rehab -Orthopedics is consulted  Dementia -Family counseled about how patients with dementia often do better behaviorally while hospitalized  if family members are present overnight; her daughter is planning to stay with her  -Delirium precautions -Head CT ordered by EDP  UTI -Patient with abnormal UA on 5/26 but no culture -She is having ongoing symptoms despite abx -Will do repeat UA and order culture -Continue cephalexin for now  HLD -Continue Zocor  DNR -I have discussed code status with the patient's family; she would not desire resuscitation and would prefer to die a natural death should that situation arise. -She will need a gold out of facility DNR form at the time of discharge      Advance Care Planning:   Code Status: DNR   Consults: Orthopedics; PT/OT; TOC team  DVT Prophylaxis: Lovenox  Family Communication: Daughter and sister were present throughout evaluation  Severity of Illness: The appropriate patient status for this patient is INPATIENT. Inpatient status is judged to be reasonable and necessary in order to provide the required intensity of service to ensure the patient's safety. The patient's presenting symptoms, physical exam findings, and initial radiographic and laboratory data in the context of their chronic comorbidities is felt to place them at high risk for further clinical deterioration. Furthermore, it is not anticipated that the patient will be medically stable for discharge from the hospital within 2 midnights of admission.   * I certify that at the point of admission it is my clinical judgment that the patient will require inpatient hospital care spanning beyond 2 midnights from the point of admission due to high intensity of service, high risk for further deterioration and high frequency of surveillance required.*  Author: Karmen Bongo, MD 04/02/2022 2:56 PM  For on call review [email or web address].

## 2022-04-02 NOTE — Consult Note (Signed)
Reason for Consult:Pubic rami fxs Referring Physician: Myrtie Cruise Time called: 0017 Time at bedside: [name] is an 88 y.o. female.  HPI: Sabrina Cervantes has been c/o right hip pain since last Thursday. There was no known fall though she has been known to fall before and not tell anyone. She was brought to the ED at [facility] where x-rays were read as negative for fx but she did have a UTI. As she was still struggling quite a bit with ambulation she was brought to [facility] today where x-rays showed right sup and inf pubic rami fxs and orthopedic surgery was consulted. She lives at home with family and generally ambulates with a RW.  Past Medical History:  Diagnosis Date  . Anemia   . Anxiety   . Arthritis    "left hip" (08/15/2016)  . Basal cell carcinoma    forehead & nose; Cancer Center cut them off; deep cutting  . Depression   . GERD (gastroesophageal reflux disease)   . Hepatitis    "think I had it as a child"  . History of kidney stones   . HOH (hard of hearing)   . Hypercholesterolemia   . Osteoporosis   . Pyelonephritis, acute     s/p ureteral stent  . Vasomotor rhinitis    chronic    Past Surgical History:  Procedure Laterality Date  . ABDOMINAL HYSTERECTOMY  1968   secondary to prolapse, ovaries not removed  . BASAL CELL CARCINOMA EXCISION     forehead & nose; Cancer Center cut them off; deep cutting  . CATARACT EXTRACTION, BILATERAL Bilateral   . COLONOSCOPY    . DILATION AND CURETTAGE OF UTERUS     S/P miscarriage  . LITHOTRIPSY     Dr Yves Dill, nephrolithiasis  . PARATHYROIDECTOMY Left 08/15/2016    inferior minimally invasive Archie Endo 08/15/2016  . PARATHYROIDECTOMY Left 08/15/2016   Procedure: LEFT INFERIOR PARATHYROIDECTOMY;  Surgeon: Armandina Gemma, MD;  Location: [place];  Service: General;  Laterality: Left;  . RETINAL DETACHMENT SURGERY    . URETERAL STENT PLACEMENT      Family History  Problem Relation Age of Onset  . Skin cancer Father   . Prostate cancer Father    . Colon cancer Brother   . Prostate cancer Brother   . Hypertension Sister   . Skin cancer Sister   . Diabetes Mellitus II Sister   . Breast cancer Sister 15  . Breast cancer Other        niece    Social History:  reports that she has never smoked. She has never used smokeless tobacco. She reports that she does not drink alcohol and does not use drugs.  Allergies:  Allergies  Allergen Reactions  . Pyridium [Phenazopyridine Hcl] Itching and Rash    Medications: I have reviewed the patient's current medications.  Results for orders placed or performed during the hospital encounter of 04/02/22 (from the past 48 hour(s))  CBC with Differential     Status: Abnormal   Collection Time: 04/02/22 12:09 PM  Result Value Ref Range   WBC 7.4 4.0 - 10.5 K/uL   RBC 3.54 (L) 3.87 - 5.11 MIL/uL   Hemoglobin 10.4 (L) 12.0 - 15.0 g/dL   HCT 33.1 (L) 36.0 - 46.0 %   MCV 93.5 80.0 - 100.0 fL   MCH 29.4 26.0 - 34.0 pg   MCHC 31.4 30.0 - 36.0 g/dL   RDW 15.0 11.5 - 15.5 %   Platelets 302  150 - 400 K/uL   nRBC 0.0 0.0 - 0.2 %   Neutrophils Relative % 60 %   Neutro Abs 4.5 1.7 - 7.7 K/uL   Lymphocytes Relative 29 %   Lymphs Abs 2.2 0.7 - 4.0 K/uL   Monocytes Relative 7 %   Monocytes Absolute 0.5 0.1 - 1.0 K/uL   Eosinophils Relative 2 %   Eosinophils Absolute 0.1 0.0 - 0.5 K/uL   Basophils Relative 1 %   Basophils Absolute 0.1 0.0 - 0.1 K/uL   Immature Granulocytes 1 %   Abs Immature Granulocytes 0.04 0.00 - 0.07 K/uL    Comment: Performed at [facility] [address] [place], [place] [address]  Basic metabolic panel     Status: Abnormal   Collection Time: 04/02/22 12:09 PM  Result Value Ref Range   Sodium 140 135 - 145 mmol/L   Potassium 3.6 3.5 - 5.1 mmol/L   Chloride 108 98 - 111 mmol/L   CO2 27 22 - 32 mmol/L   Glucose, Bld 106 (H) 70 - 99 mg/dL    Comment: Glucose reference range applies only to samples taken after fasting for at least 8 hours.   BUN 14 8 - 23 mg/dL    Creatinine, Ser 0.88 0.44 - 1.00 mg/dL   Calcium 9.4 8.9 - 10.3 mg/dL   GFR, Estimated >60 >60 mL/min    Comment: (NOTE) Calculated using the CKD-EPI Creatinine Equation (2021)    Anion gap 5 5 - 15    Comment: Performed at [facility] [address] [place], [place] [address]  Brain natriuretic peptide     Status: None   Collection Time: 04/02/22 12:09 PM  Result Value Ref Range   B Natriuretic Peptide 20.5 0.0 - 100.0 pg/mL    Comment: Performed at [facility] [address] [place], [place] [address]    DG FEMUR MIN 2 VIEWS LEFT  Result Date: 04/02/2022 CLINICAL DATA:  z a 88 year old female presents for evaluation of bilateral hip pain for 6 months RIGHT greater than LEFT. EXAM: LEFT FEMUR 2 VIEWS COMPARISON:  Contralateral femur, hip and pelvis. FINDINGS: Mild degenerative changes about the LEFT hip. No sign of fracture of the LEFT femur. Soft tissues are unremarkable. Osteopenia. IMPRESSION: Osteopenia and degenerative changes without acute finding. Electronically Signed   By: Zetta Bills M.D.   On: 04/02/2022 13:13   DG FEMUR, MIN 2 VIEWS RIGHT  Result Date: 04/02/2022 CLINICAL DATA:  BILATERAL hip pain for 6 months RIGHT greater than LEFT EXAM: RIGHT FEMUR 2 VIEWS COMPARISON:  03/29/2022 FINDINGS: Osseous demineralization. Narrowing of RIGHT knee joint spaces. RIGHT hip joint space preserved. Femur intact without fracture or dislocation. However, new displaced fractures are identified at the RIGHT superior and inferior pubic rami. IMPRESSION: Displaced fractures of the RIGHT superior and inferior pubic rami. Intact RIGHT femur. Electronically Signed   By: Lavonia Dana M.D.   On: 04/02/2022 13:13    Review of Systems  Unable to perform ROS: Dementia  Blood pressure 136/65, pulse 72, temperature 99.3 F (37.4 C), temperature source Oral, resp. rate (!) 22, SpO2 99 %. Physical Exam Constitutional:      General: She is not in acute distress.    Appearance: She  is well-developed. She is not diaphoretic.  HENT:     Head: Normocephalic and atraumatic.  Eyes:     General: No scleral icterus.       Right eye: No discharge.  Left eye: No discharge.     Conjunctiva/sclera: Conjunctivae normal.  Cardiovascular:     Rate and Rhythm: Normal rate and regular rhythm.  Pulmonary:     Effort: Pulmonary effort is normal. No respiratory distress.  Musculoskeletal:     Cervical back: Normal range of motion.     Comments: RLE No traumatic wounds, ecchymosis, or rash  Mod TTP hip  No knee or ankle effusion  Knee stable to varus/ valgus and anterior/posterior stress  Sens DPN, SPN, TN could not assess  Motor EHL, ext, flex, evers grossly intact  DP 2+, PT 0, 2+ NP edema  Skin:    General: Skin is warm and dry.  Neurological:     Mental Status: She is alert.  Psychiatric:        Mood and Affect: Mood normal.        Behavior: Behavior normal.    Assessment/Plan: Pubic rami fxs -- Plan non-operative treatment with WBAT and PT/OT. She may f/u with her established orthopedist in [place] or see Dr. Mable Fill in 3-4 weeks.    Lisette Abu, PA-C Orthopedic Surgery [phone] 04/02/2022, 2:23 PM

## 2022-04-02 NOTE — Telephone Encounter (Signed)
Reviewed message and reviewed chart.  It appears she is in the ER now for evaluation.

## 2022-04-02 NOTE — Telephone Encounter (Signed)
S/w pt daughter -  Per [name] - pt was complaining of R Leg pain. Also urinating very small amounts every 15 minutes. [name] took pt to ER - pt was checked for UTI , positive, given Cephalexin. Xray of Hips taken, to see if fall due to bruise on backside, neg for breaks. Pt has dementia, but has not mentioned fall.  Pt still complaining of pain in upper calf, lower thigh area. When asked if around knee area, [name] stated near there, yes. R leg swelling , more than usual, and extreme pain. Cannot get compression stocking on due to pt yelling in pain. [name] stated pt cannot get up off chair, out of bed, or off commode due to pain. Pt refuses to use leg now, will not lift it.   [name] advised due to amount of pain pt seems to be in, needs to go back to ER. Due to leg swelling, amount of pain, and area :around back of knee area, concern for possible blood clot.  [name] stated she thought the same, and will bring pt to [facility] hospital.  Refusing to go to [facility], stated she voiced concerns for poss blood clot at [facility] and was ignored.

## 2022-04-02 NOTE — Progress Notes (Signed)
BLE venous duplex has been completed.   Results can be found under chart review under CV PROC. 04/02/2022 3:40 PM Bekim Werntz RVT, RDMS

## 2022-04-03 DIAGNOSIS — S32591A Other specified fracture of right pubis, initial encounter for closed fracture: Secondary | ICD-10-CM | POA: Diagnosis not present

## 2022-04-03 DIAGNOSIS — S32599A Other specified fracture of unspecified pubis, initial encounter for closed fracture: Secondary | ICD-10-CM | POA: Diagnosis not present

## 2022-04-03 DIAGNOSIS — E43 Unspecified severe protein-calorie malnutrition: Secondary | ICD-10-CM | POA: Insufficient documentation

## 2022-04-03 MED ORDER — ENSURE ENLIVE PO LIQD: 237.0000 mL | Freq: Two times a day (BID) | ORAL | 0 refills | Status: DC

## 2022-04-03 MED ORDER — PROPYLENE GLYCOL-GLYCERIN 0.6-0.6 % OP SOLN: Freq: Every day | OPHTHALMIC | Status: DC | PRN

## 2022-04-03 MED ORDER — OMEGA-3-ACID ETHYL ESTERS 1 G PO CAPS: 1.0000 g | ORAL_CAPSULE | Freq: Every day | ORAL | Status: DC

## 2022-04-03 MED ORDER — TRAMADOL HCL 50 MG PO TABS: 50.0000 mg | ORAL_TABLET | Freq: Three times a day (TID) | ORAL | 0 refills | Status: DC | PRN

## 2022-04-03 MED ORDER — ADULT MULTIVITAMIN W/MINERALS CH: 1.0000 | ORAL_TABLET | Freq: Every day | ORAL | Status: DC

## 2022-04-03 MED ORDER — ENSURE ENLIVE PO LIQD: 237.0000 mL | Freq: Two times a day (BID) | ORAL | Status: DC

## 2022-04-03 MED ORDER — POLYVINYL ALCOHOL 1.4 % OP SOLN: 1.0000 [drp] | Freq: Every day | OPHTHALMIC | Status: DC | PRN

## 2022-04-03 NOTE — Evaluation (Signed)
Physical Therapy Evaluation Patient Details Name: Sabrina Cervantes MRN: [id number] DOB: 08/12/1929 Today's Date: 04/03/2022  History of Present Illness  Pt is a 88 y/o female presenting with leg pain and difficulty ambulating. Pt found to have pubic rami fractures. Orthopedics consulted and recommended nonoperative mgmt. Pt presented to [facility] on 5/26 with similar complaints, imaging negative and diagnosed with UTI but continued to decline at home. PMH: HLD, dementia, hyperparathyroidism.  Clinical Impression  Received pt semi-reclined in bed with daughter actively involved in session. Pt performed bed mobility with min A using bed features and transfers with RW and min A. Pt requires cues for RW safety and sequencing and with hx of cognitive impairments at baseline. Pt's daughter expressed desire for pt to go home, but with questions regarding available aide services - notified case manager. Recommend HHPT at this time. Acute PT to cont to follow.      Recommendations for follow up therapy are one component of a multi-disciplinary discharge planning process, led by the attending physician.  Recommendations may be updated based on patient status, additional functional criteria and insurance authorization.  Follow Up Recommendations Home health PT    Assistance Recommended at Discharge Frequent or constant Supervision/Assistance  Patient can return home with the following  A little help with walking and/or transfers;A little help with bathing/dressing/bathroom;Assistance with cooking/housework;Direct supervision/assist for medications management;Direct supervision/assist for financial management;Assist for transportation;Help with stairs or ramp for entrance    Equipment Recommendations None recommended by PT (has RW)  Recommendations for Other Services       Functional Status Assessment Patient has had a recent decline in their functional status and demonstrates the ability to make significant  improvements in function in a reasonable and predictable amount of time.     Precautions / Restrictions Precautions Precautions: Fall Restrictions Weight Bearing Restrictions: Yes RLE Weight Bearing: Weight bearing as tolerated LLE Weight Bearing: Weight bearing as tolerated      Mobility  Bed Mobility Overal bed mobility: Needs Assistance Bed Mobility: Rolling, Sidelying to Sit Rolling: Min assist Sidelying to sit: Min assist       General bed mobility comments: HOB elevated and verbal and tactile cues for rolling technique. Required assist to advance LEs off bed. (Simultaneous filing. User may not have seen previous data.) Patient Response: Cooperative  Transfers Overall transfer level: Needs assistance (Simultaneous filing. User may not have seen previous data.) Equipment used: Rolling walker (2 wheels) (Simultaneous filing. User may not have seen previous data.) Transfers: Sit to/from Stand, Bed to chair/wheelchair/BSC (Simultaneous filing. User may not have seen previous data.) Sit to Stand: Min assist (Simultaneous filing. User may not have seen previous data.) Stand pivot transfers: Min assist         General transfer comment: required min A to stand from EOB and cues for hand placement. Required min A to steer RW and cues for turning technique/RW safety. (Simultaneous filing. User may not have seen previous data.)    Ambulation/Gait                  Stairs            Wheelchair Mobility    Modified Rankin (Stroke Patients Only)       Balance Overall balance assessment: Needs assistance Sitting-balance support: Feet supported, Bilateral upper extremity supported Sitting balance-Leahy Scale: Fair Sitting balance - Comments: sitting EOB with supervision   Standing balance support: Bilateral upper extremity supported, During functional activity (RW) Standing balance-Leahy Scale: Poor Standing  balance comment: required min guard for static  standing balance and min A for dynamic standing balance with transfers                             Pertinent Vitals/Pain Pain Assessment Pain Assessment: Faces Faces Pain Scale: Hurts little more Pain Location: unable to specify Pain Descriptors / Indicators: Aching, Discomfort, Grimacing, Guarding, Nagging, Moaning, Sore Pain Intervention(s): Limited activity within patient's tolerance, Monitored during session, Premedicated before session, Repositioned    Home Living Family/patient expects to be discharged to:: Private residence Living Arrangements: Children Available Help at Discharge: Family;Available 24 hours/day Type of Home: House Home Access: Stairs to enter Entrance Stairs-Rails: Right Entrance Stairs-Number of Steps: 2 steps and small threshold   Home Layout: One level Home Equipment: Advice worker (2 wheels);BSC/3in1;Grab bars - toilet;Grab bars - tub/shower;Hand held shower head (reports BSC given to pt and falling apart) Additional Comments: pt's daughter has neighbor who has WC available. Pt's daughter providing subjective history.    Prior Function Prior Level of Function : Needs assist       Physical Assist : ADLs (physical)   ADLs (physical): Bathing Mobility Comments: Independent without RW prior to fall ADLs Comments: pts daughter has been assisting with bathing for past 2-3 months. per daughter, pt can manage clothes at times, but due to memory deficits - difficulty locating items in the home. can go to kitchen and get a small snack if needed     Hand Dominance   Dominant Hand: Right    Extremity/Trunk Assessment   Upper Extremity Assessment Upper Extremity Assessment: Overall WFL for tasks assessed    Lower Extremity Assessment Lower Extremity Assessment: Defer to PT evaluation    Cervical / Trunk Assessment Cervical / Trunk Assessment: Kyphotic  Communication   Communication: HOH  Cognition Arousal/Alertness:  Awake/alert Behavior During Therapy: WFL for tasks assessed/performed Overall Cognitive Status: History of cognitive impairments - at baseline                                 General Comments: dementia        General Comments General comments (skin integrity, edema, etc.): Daughter, [name] present - very engaged and supportive    Exercises     Assessment/Plan    PT Assessment Patient needs continued PT services  PT Problem List Decreased strength;Decreased range of motion;Decreased activity tolerance;Decreased balance;Decreased mobility;Decreased coordination;Decreased cognition;Decreased knowledge of use of DME;Decreased safety awareness;Pain       PT Treatment Interventions DME instruction;Gait training;Stair training;Functional mobility training;Therapeutic activities;Therapeutic exercise;Balance training;Neuromuscular re-education;Cognitive remediation;Patient/family education;Wheelchair mobility training    PT Goals (Current goals can be found in the Care Plan section)  Acute Rehab PT Goals Patient Stated Goal: pt did not state PT Goal Formulation: With patient/family Time For Goal Achievement: 04/10/22 Potential to Achieve Goals: Good    Frequency Min 5X/week     Co-evaluation               AM-PAC PT "6 Clicks" Mobility  Outcome Measure Help needed turning from your back to your side while in a flat bed without using bedrails?: A Little Help needed moving from lying on your back to sitting on the side of a flat bed without using bedrails?: A Little Help needed moving to and from a bed to a chair (including a wheelchair)?: A Little Help needed standing up from  a chair using your arms (e.g., wheelchair or bedside chair)?: A Little Help needed to walk in hospital room?: A Little Help needed climbing 3-5 steps with a railing? : A Lot 6 Click Score: 17    End of Session Equipment Utilized During Treatment: Gait belt Activity Tolerance: Patient  tolerated treatment well Patient left: in chair;with call bell/phone within reach;with chair alarm set;with family/visitor present Nurse Communication: Mobility status PT Visit Diagnosis: Unsteadiness on feet (R26.81);Muscle weakness (generalized) (M62.81);History of falling (Z91.81);Pain Pain - Right/Left: Right Pain - part of body: Hip (pelvis)    Time: 9507-2257 PT Time Calculation (min) (ACUTE ONLY): 39 min   Charges:   PT Evaluation $PT Eval Low Complexity: 1 Low PT Treatments $Therapeutic Activity: 23-37 mins        Becky Sax PT, DPT ] Blenda Nicely 04/03/2022, 12:18 PM

## 2022-04-03 NOTE — TOC Initial Note (Signed)
Transition of Care [facility]) - Initial/Assessment Note    Patient Details  Name: Sabrina Cervantes MRN: [id number] Date of Birth: [birth year]  Transition of Care [facility]) CM/SW Contact:    Sharin Mons, RN Phone Number: 04/03/2022, 12:09 PM  Clinical Narrative:                 Presents with c/o R hip pain, s/p fall. Pt with Pubic rami fxs. From home with family, 2 adult sons ( caregivers). Supportive daughter that lives in [place]. [place] spoke with pt /daughter @ bedside regarding d/c planning. Pt agreeable to home health services, PT /OT evaluations pending.... Pt without preference for home health services. Referral made with [facility] pending  MD's order/ F2F. Referral made with [facility] for W/C , BSC. Equipment will be delivered to bedside prior to d/c. NCM confirmed PCP. Pt with transportation to home once d/c ready.  TOC team following for TOC needs....  Expected Discharge Plan: [place] Barriers to Discharge: Continued Medical Work up   Patient Goals and CMS Choice     Choice offered to / list presented to : Adult Children, Patient  Expected Discharge Plan and Services Expected Discharge Plan: [place]   Discharge Planning Services: CM Consult   Living arrangements for the past 2 months: Single Family Home                 DME Arranged: 3-N-1, Walker rolling DME Agency: [facility] Date DME Agency Contacted: 04/03/22 Time DME Agency Contacted: 1203 Representative spoke with at DME Agency: [place]: PT, OT, Nurse's Aide [place] Agency: [facility] Date [place]: 04/03/22 Time [place]: 0258    Prior Living Arrangements/Services Living arrangements for the past 2 months: [place] with:: Adult Children Patient language and need for interpreter reviewed:: Yes Do you feel safe going back to the place where you live?: Yes      Need for Family Participation in Patient  Care: Yes (Comment) Care giver support system in place?: Yes (comment) Current home services: DME (RW) Criminal Activity/Legal Involvement Pertinent to Current Situation/Hospitalization: No - Comment as needed  Activities of Daily Living Home Assistive Devices/Equipment: Bedside commode/3-in-1, Dentures (specify type), Walker (specify type) ADL Screening (condition at time of admission) Patient's cognitive ability adequate to safely complete daily activities?: No Is the patient deaf or have difficulty hearing?: Yes Does the patient have difficulty seeing, even when wearing glasses/contacts?: Yes Does the patient have difficulty concentrating, remembering, or making decisions?: Yes Patient able to express need for assistance with ADLs?: Yes Does the patient have difficulty dressing or bathing?: Yes Independently performs ADLs?: No Communication: Independent Dressing (OT): Independent Grooming: Needs assistance Is this a change from baseline?: Pre-admission baseline Feeding: Independent Bathing: Needs assistance Is this a change from baseline?: Pre-admission baseline Toileting: Independent In/Out Bed: Independent Walks in Home: Independent with device (comment) Does the patient have difficulty walking or climbing stairs?: Yes Weakness of Legs: Both Weakness of Arms/Hands: None  Permission Sought/Granted   Permission granted to share information with : Yes, Verbal Permission Granted  Share Information with NAME: [name] [phone]           Emotional Assessment Appearance:: Appears stated age Attitude/Demeanor/Rapport: Engaged Affect (typically observed): Accepting Orientation: : Oriented to Self, Oriented to Place, Oriented to  Time, Oriented to Situation Alcohol / Substance Use: Not Applicable Psych Involvement: No (comment)  Admission diagnosis:  Fall, initial  encounter [id number]] 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, 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] Closed fracture of  multiple pubic rami, initial encounter [facility]) [S32.599A] Patient Active Problem List   Diagnosis Date Noted   Closed fracture of multiple pubic rami, initial encounter ([place]) 04/02/2022   Dementia without behavioral disturbance ([place]) 04/02/2022   UTI (urinary tract infection) 04/02/2022   DNR (do not resuscitate) 04/02/2022   Swelling of lower extremity 03/04/2022   History of COVID-19 08/05/2021   Urinary frequency 06/25/2020   Sleep difficulties 06/25/2020   Memory change 06/13/2020   Hypokalemia 01/31/2020   Iron deficiency anemia 10/23/2019   Left knee pain 06/15/2019   Left hip pain 05/04/2018   History of hyperparathyroidism 06/25/2016   Vitamin D deficiency 02/17/2016   Health care maintenance 04/17/2015   Dizziness 10/02/2014   Sinusitis 10/02/2014   Environmental allergies 07/31/2014   Stress 07/31/2014   Constipation 01/23/2014   Leg skin lesion, left 07/21/2013   Osteoporosis 09/29/2012   GERD (gastroesophageal reflux disease) 09/29/2012   Hypercholesterolemia 09/29/2012   PCP:  Einar Pheasant, MD Pharmacy:   [facility] [address], [place] - [place] [address] Phone: [phone] Fax: [phone]     Social Determinants of Health (SDOH) Interventions    Readmission Risk Interventions     View : No data to display.

## 2022-04-03 NOTE — Evaluation (Signed)
Occupational Therapy Evaluation Patient Details Name: Sabrina Cervantes MRN: [id number] DOB: [birth year] Today's Date: 04/03/2022   History of Present Illness Pt is a 88 y/o female presenting with leg pain and difficulty ambulating. Pt found to have pubic rami fractures. Orthopedics consulted and recommended nonoperative mgmt. Pt presented to [facility] on 5/26 with similar complaints, imaging negative and diagnosed with UTI but continued to decline at home. PMH: HLD, dementia, hyperparathyroidism.   Clinical Impression   PTA, pt lives with sons, typically ambulatory without a device and able to complete basic grooming tasks, toileting tasks and access food items in kitchen. Pt's daughter assists with showering tasks and sons typically home to provide supervision due to pt's memory deficits. Pt presents now moving fairly well with minimal pain. Overall, pt requires Min A for mobility using RW, Min A for UB ADL and Mod A for LB ADLs. Pt's daughter, Sabrina Cervantes, present and supportive. Extended time spent discussing DME needs/uses, fall prevention strategies and what assistance may be needed at DC. Pt's daughter requesting transport chair for community mobility with pt (if covered by insurance). Pt's daughter receptive to [facility] services at DC. Will continue to follow acutely to maximize safety and independence with daily tasks.      Recommendations for follow up therapy are one component of a multi-disciplinary discharge planning process, led by the attending physician.  Recommendations may be updated based on patient status, additional functional criteria and insurance authorization.   Follow Up Recommendations  Home health OT    Assistance Recommended at Discharge Frequent or constant Supervision/Assistance  Patient can return home with the following A little help with walking and/or transfers;A little help with bathing/dressing/bathroom;Assistance with cooking/housework;Direct supervision/assist for medications  management;Direct supervision/assist for financial management;Help with stairs or ramp for entrance    Functional Status Assessment  Patient has had a recent decline in their functional status and demonstrates the ability to make significant improvements in function in a reasonable and predictable amount of time.  Equipment Recommendations  BSC/3in1;Other (comment) (transport chair with footrests per daughter request for community mobility)    Recommendations for Other Services       Precautions / Restrictions Precautions Precautions: Fall Restrictions Weight Bearing Restrictions: Yes RLE Weight Bearing: Weight bearing as tolerated LLE Weight Bearing: Weight bearing as tolerated      Mobility Bed Mobility               General bed mobility comments: up in chair on entry    Transfers Overall transfer level: Needs assistance Equipment used: Rolling walker (2 wheels) Transfers: Sit to/from Stand, Bed to chair/wheelchair/BSC Sit to Stand: Min guard     Step pivot transfers: Min assist     General transfer comment: no assist to stand from recliner, MinA to manuever RW to pivot to/from [facility] and cues for hand placement/sequencing      Balance Overall balance assessment: Needs assistance Sitting-balance support: No upper extremity supported, Feet supported Sitting balance-Leahy Scale: Fair     Standing balance support: Bilateral upper extremity supported, During functional activity Standing balance-Leahy Scale: Fair                             ADL either performed or assessed with clinical judgement   ADL Overall ADL's : Needs assistance/impaired Eating/Feeding: Set up;Sitting   Grooming: Minimal assistance;Standing   Upper Body Bathing: Minimal assistance;Sitting   Lower Body Bathing: Moderate assistance;Sit to/from stand   Upper Body Dressing :  Minimal assistance;Sitting   Lower Body Dressing: Moderate assistance;Sit to/from stand   Toilet  Transfer: Minimal assistance;Stand-pivot;BSC/3in1;Rolling walker (2 wheels) Toilet Transfer Details (indicate cue type and reason): cues for RW use/navigation Toileting- Clothing Manipulation and Hygiene: Minimal assistance;Sit to/from stand Toileting - Clothing Manipulation Details (indicate cue type and reason): cues for sequencing but pt able to doff/don underwear and perform hygiene in standing     Functional mobility during ADLs: Minimal assistance;Rolling walker (2 wheels) General ADL Comments: Pt limited primarily by baseline dementia and memory deficits but moving fairly well despite fx with minimal pain. Discussed DME needs at home, setup and assist needed     Vision Baseline Vision/History: 1 Wears glasses Ability to See in Adequate Light: 1 Impaired Patient Visual Report: No change from baseline Vision Assessment?: No apparent visual deficits     Perception     Praxis      Pertinent Vitals/Pain Pain Assessment Pain Assessment: Faces Faces Pain Scale: Hurts little more Pain Location: legs, back Pain Descriptors / Indicators: Grimacing, Sore Pain Intervention(s): Monitored during session     Hand Dominance Right   Extremity/Trunk Assessment Upper Extremity Assessment Upper Extremity Assessment: Overall WFL for tasks assessed   Lower Extremity Assessment Lower Extremity Assessment: Defer to PT evaluation   Cervical / Trunk Assessment Cervical / Trunk Assessment: Kyphotic   Communication Communication Communication: HOH   Cognition Arousal/Alertness: Awake/alert Behavior During Therapy: WFL for tasks assessed/performed Overall Cognitive Status: History of cognitive impairments - at baseline                                 General Comments: dementia at baseline, apparent memory deficits during session, requires step by step sequencing cues especially with new tasks (RW use)     General Comments  Daughter, Sabrina Cervantes present - very engaged and  supportive    Exercises     Shoulder Instructions      Home Living Family/patient expects to be discharged to:: Private residence Living Arrangements: Children Available Help at Discharge: Family;Available 24 hours/day Type of Home: House Home Access: Stairs to enter [employer] of Steps: 2 steps and small threshold Entrance Stairs-Rails: Right Home Layout: One level     Bathroom Shower/Tub: Occupational psychologist: Handicapped height Bathroom Accessibility: Yes   Home Equipment: Advice worker (2 wheels);BSC/3in1;Grab bars - toilet;Grab bars - tub/shower;Hand held shower head (reports BSC given to pt and falling apart)   Additional Comments: pt's daughter has neighbor who has WC available. Pt's daughter providing subjective history.      Prior Functioning/Environment Prior Level of Function : Needs assist       Physical Assist : ADLs (physical)   ADLs (physical): Bathing Mobility Comments: Independent without RW prior to fall ADLs Comments: pts daughter has been assisting with bathing for past 2-3 months. per daughter, pt can manage clothes at times, but due to memory deficits - difficulty locating items in the home. can go to kitchen and get a small snack if needed        OT Problem List: Decreased strength;Decreased activity tolerance;Impaired balance (sitting and/or standing);Decreased cognition;Decreased safety awareness;Decreased knowledge of use of DME or AE;Pain      OT Treatment/Interventions: Self-care/ADL training;Therapeutic exercise;Energy conservation;DME and/or AE instruction;Therapeutic activities;Patient/family education;Balance training    OT Goals(Current goals can be found in the care plan section) Acute Rehab OT Goals Patient Stated Goal: go home when ready OT Goal  Formulation: With patient/family Time For Goal Achievement: 04/17/22 Potential to Achieve Goals: Good  OT Frequency: Min 2X/week    Co-evaluation               AM-PAC OT "6 Clicks" Daily Activity     Outcome Measure Help from another person eating meals?: A Little Help from another person taking care of personal grooming?: A Little Help from another person toileting, which includes using toliet, bedpan, or urinal?: A Lot Help from another person bathing (including washing, rinsing, drying)?: A Lot Help from another person to put on and taking off regular upper body clothing?: A Little Help from another person to put on and taking off regular lower body clothing?: A Lot 6 Click Score: 15   End of Session Equipment Utilized During Treatment: Gait belt;Rolling walker (2 wheels) Nurse Communication: Mobility status  Activity Tolerance: Patient tolerated treatment well Patient left: in chair;with call bell/phone within reach;with chair alarm set;with family/visitor present  OT Visit Diagnosis: Unsteadiness on feet (R26.81);Other abnormalities of gait and mobility (R26.89);History of falling (Z91.81)                Time: 4174-0814 OT Time Calculation (min): 39 min Charges:  OT General Charges $OT Visit: 1 Visit OT Evaluation $OT Eval Moderate Complexity: 1 Mod OT Treatments $Self Care/Home Management : 8-22 mins $Therapeutic Activity: 8-22 mins  Malachy Chamber, OTR/L Acute Rehab Services Office: [phone]   Layla Maw 04/03/2022, 12:11 PM

## 2022-04-03 NOTE — Plan of Care (Signed)

## 2022-04-03 NOTE — Discharge Summary (Signed)
Physician Discharge Summary  Sabrina Cervantes XHB:[id number] DOB: [birth year] DOA: 04/02/2022  PCP: Einar Pheasant, MD  Admit date: 04/02/2022 Discharge date: 04/03/2022 Recommendations for Outpatient Follow-up:  Follow up with PCP in 1 weeks-call for appointment Please obtain BMP/CBC in one week Follow-up with orthopedic surgeon Follow-up PCP regarding your urine culture  Discharge Dispo: HOME /W hh Discharge Condition: Stable Code Status:   Code Status: DNR Diet recommendation:  Diet Order             Diet regular Room service appropriate? Yes; Fluid consistency: Thin  Diet effective now                    Brief/Interim Summary: 88 y.o. F W/ HLD, dementia, and hyperparathyroidism s/p parathyroidectomy presenting with leg pain and ambulatory dysfunction.She was seen for similar symptoms on 5/26 and diagnosed with a UTI.  Thursday, she called and said "she was in a bad way."  She was in her recliner and she was hurting. They got her up to the bathroom and she couldn't stand on her own. They got a walker and helped her to the bathroom and back.  Friday, she went to the ER at [facility] and she had an xray.  Last known fall was 3-4 weeks ago, could have fallen again.  Xray was negative and she was diagnosed with UTI, seemed to do ok with a couple of days but not peeing much.  She complained of dysuria - but hard to know with dementia.  Her daughter called her PCP this AM and reported inability to get up, ongoing pain.  She was instructed to bring her back to the ER.  +frequency, small voids In the ED found to have pelvic fracture closed orthopedic was consulted and patient was admitted    Seen by PT OT care from home health PT noted for placement.  Discharge Diagnoses:  Principal Problem:   Closed fracture of multiple pubic rami, initial encounter ([place]) Active Problems:   Hypercholesterolemia   Dementia without behavioral disturbance (HCC)   UTI (urinary tract infection)   DNR (do not  resuscitate)   Protein-calorie malnutrition, severe  Closed fracture of multiple pubic rami, initial encounter: Appreciate orthopedic input plan for nonoperative treatment with WBAT, PT OT pain control and follow-up with orthopedic at [facility] or Dr. Mable Fill in 3 to 4 weeks patient did well with PT OT and is being discharged home with instruction for outpatient follow-up with her orthopedic surgeon.   UTI: UA abnormal with pyuria but no culture on 5/26 continued cephalexin follow-up urine culture after discharge shows Pseudomonas contacted Dr. Nicki Reaper  PCP will prescribe patient appropriate oral antibiotic based on sensitivity.   Hypercholesterolemia: Continue simvastatin Dementia without behavioral disturbance: Supportive care PT OT delirium precaution.  Patient gets behavioral symptoms in the hospital, CT head with mild to moderate cerebral atrophy and small vessel ischemic changes. Nutrition Status: Nutrition Problem: Severe Malnutrition Etiology: social / environmental circumstances, chronic illness (dementia) Signs/Symptoms: severe muscle depletion, severe fat depletion Interventions: Ensure Enlive (each supplement provides 350kcal and 20 grams of protein), MVI    Consults: PTOT Subjective: Alert awake oriented resting comfortably no new complaints  Discharge Exam: Vitals:   04/03/22 0816 04/03/22 1213  BP: (!) 152/67 118/67  Pulse: 77 69  Resp: 16 16  Temp: 98 F (36.7 C) 98.3 F (36.8 C)  SpO2: 98% 99%   General: Pt is alert, awake, not in acute distress Cardiovascular: RRR, S1/S2 +, no rubs, no gallops Respiratory:  CTA bilaterally, no wheezing, no rhonchi Abdominal: Soft, NT, ND, bowel sounds + Extremities: no edema, no cyanosis  Discharge Instructions  Discharge Instructions     Discharge instructions   Complete by: As directed    Check CBC BMP in 1 week.  Follow-up with PCP in 1 week  Please call call MD or return to ER for similar or worsening recurring problem  that brought you to hospital or if any fever,nausea/vomiting,abdominal pain, uncontrolled pain, chest pain,  shortness of breath or any other alarming symptoms.  Please follow-up your doctor as instructed in a week time and call the office for appointment.  Please avoid alcohol, smoking, or any other illicit substance and maintain healthy habits including taking your regular medications as prescribed.  You were cared for by a hospitalist during your hospital stay. If you have any questions about your discharge medications or the care you received while you were in the hospital after you are discharged, you can call the unit and ask to speak with the hospitalist on call if the hospitalist that took care of you is not available.  Once you are discharged, your primary care physician will handle any further medical issues. Please note that NO REFILLS for any discharge medications will be authorized once you are discharged, as it is imperative that you return to your primary care physician (or establish a relationship with a primary care physician if you do not have one) for your aftercare needs so that they can reassess your need for medications and monitor your lab values   Increase activity slowly   Complete by: As directed       Allergies as of 04/03/2022       Reactions   Pyridium [phenazopyridine Hcl] Itching, Rash        Medication List     TAKE these medications    acetaminophen 500 MG tablet Commonly known as: TYLENOL Take 1,000 mg by mouth 3 (three) times daily.   CALCIUM CARBONATE-VITAMIN D PO Take 1 tablet by mouth 2 (two) times daily.   cephALEXin 500 MG capsule Commonly known as: Keflex Take 1 capsule (500 mg total) by mouth 3 (three) times daily for 7 days.   CRANBERRY PO Take 1 tablet by mouth daily.   cyanocobalamin 1000 MCG/ML injection Commonly known as: (VITAMIN B-12) Inject 1,000 mcg into the muscle every 30 (thirty) days.   feeding supplement Liqd Take  237 mLs by mouth 2 (two) times daily between meals.   Fish Oil 1200 MG Caps Take 1,200 mg by mouth daily.   ipratropium 0.03 % nasal spray Commonly known as: ATROVENT Place 1 spray into both nostrils every 12 (twelve) hours.   multivitamins with iron Tabs tablet Take 1 tablet by mouth daily.   PROBIOTIC PO Take 1 tablet by mouth daily.   simvastatin 40 MG tablet Commonly known as: ZOCOR Take 1 tablet (40 mg total) by mouth every evening.   SOOTHE OP Place 1 drop into both eyes daily as needed (dry eyes).   traMADol 50 MG tablet Commonly known as: Ultram Take 1 tablet (50 mg total) by mouth 3 (three) times daily as needed for up to 15 doses.   Vitamin D-3 25 MCG (1000 UT) Caps Take 1,000 Units by mouth daily.               Durable Medical Equipment  (From admission, onward)           Start     Ordered  04/03/22 1158  For home use only DME 3 n 1  Once        04/03/22 1158   04/03/22 1157  For home use only DME lightweight manual wheelchair with seat cushion  Once       Comments: Patient suffers from Pubic rami fxs  which impairs their ability to perform daily activities like bathing in the home.  A walker will not resolve  issue with performing activities of daily living. A wheelchair will allow patient to safely perform daily activities. Patient is not able to propel themselves in the home using a standard weight wheelchair due to general weakness. Patient can self propel in the lightweight wheelchair. Length of need 6 months . Accessories: elevating leg rests (ELRs), wheel locks, extensions and anti-tippers.   04/03/22 1158            Follow-up Information     Einar Pheasant, MD Follow up.   Specialty: Internal Medicine Contact information: [address] [place] [id number] [phone]                Allergies  Allergen Reactions   Pyridium [Phenazopyridine Hcl] Itching and Rash    The results of significant  diagnostics from this hospitalization (including imaging, microbiology, ancillary and laboratory) are listed below for reference.    Microbiology: No results found for this or any previous visit (from the past 240 hour(s)).  Procedures/Studies: CT HEAD WO CONTRAST (5MM)  Result Date: 04/02/2022 CLINICAL DATA:  Head trauma, minor. Dementia. Possible fall. Increased confusion/lethargy. EXAM: CT HEAD WITHOUT CONTRAST TECHNIQUE: Contiguous axial images were obtained from the base of the skull through the vertex without intravenous contrast. RADIATION DOSE REDUCTION: This exam was performed according to the departmental dose-optimization program which includes automated exposure control, adjustment of the mA and/or kV according to patient size and/or use of iterative reconstruction technique. COMPARISON:  No pertinent prior exams available for comparison. FINDINGS: Brain: Mild-to-moderate cerebral atrophy. Comparatively mild cerebellar atrophy. Mild patchy and ill-defined hypoattenuation within the cerebral white matter, nonspecific but compatible with chronic small vessel ischemic disease. Partially empty sella turcica. There is no acute intracranial hemorrhage. No demarcated cortical infarct. No extra-axial fluid collection. No evidence of an intracranial mass. No midline shift. Vascular: No hyperdense vessel. Atherosclerotic calcifications. Skull: No fracture or aggressive osseous lesion. Sinuses/Orbits: No mass or acute finding within the imaged orbits. Trace mucosal thickening within the bilateral ethmoid and left maxillary sinuses. IMPRESSION: No evidence of acute intracranial abnormality. Mild chronic small vessel ischemic changes within the cerebral white matter. Mild-to-moderate cerebral atrophy. Comparatively mild cerebellar atrophy. Electronically Signed   By: Kellie Simmering D.O.   On: 04/02/2022 15:06   CT PELVIS WO CONTRAST  Result Date: 04/02/2022 CLINICAL DATA:  Pelvic fracture EXAM: CT PELVIS  WITHOUT CONTRAST TECHNIQUE: Multidetector CT imaging of the pelvis was performed following the standard protocol without intravenous contrast. RADIATION DOSE REDUCTION: This exam was performed according to the departmental dose-optimization program which includes automated exposure control, adjustment of the mA and/or kV according to patient size and/or use of iterative reconstruction technique. COMPARISON:  04/02/2022 FINDINGS: Urinary Tract: Distal ureters and bladder are unremarkable. No urinary tract calculi. Bowel: No bowel obstruction or ileus. Sigmoid diverticulosis without diverticulitis. Normal appendix right lower quadrant. No bowel wall thickening or inflammatory change. Vascular/Lymphatic: No significant vascular findings. No pathologic adenopathy. Reproductive:  Uterus is surgically absent.  No adnexal masses. Other: No free fluid or free intraperitoneal gas. No abdominal wall hernia. Musculoskeletal:  Minimally displaced right superior and inferior pubic rami fractures are identified, with near anatomic alignment. There is abnormal sclerosis within the right sacral ala, most consistent with chronic insufficiency fracture. Slight cortical discontinuity within the inferior aspect of the right sacral ala abutting the SI joint consistent with acute fracture line. This is nondisplaced. The remainder of the bony pelvis is unremarkable. The hips are well aligned, with symmetrical bilateral hip osteoarthritis. Reconstructed images demonstrate no additional findings. IMPRESSION: 1. Acute displaced right superior and inferior pubic rami fractures as seen on recent x-rays. 2. Findings consistent with right sacral ala insufficiency fracture, with acute fracture line along the inferior margin of the right sacral ala abutting the SI joint. This is nondisplaced. 3. Symmetrical bilateral hip osteoarthritis. 4. Sigmoid diverticulosis without diverticulitis. Electronically Signed   By: Randa Ngo M.D.   On:  04/02/2022 19:11   DG Pelvis Comp Min 3V  Result Date: 04/02/2022 CLINICAL DATA:  Right leg pain, fell EXAM: JUDET PELVIS - 3+ VIEW COMPARISON:  04/02/2022, 03/29/2022 FINDINGS: Frontal, pelvic inlet, and pelvic outlet views of the bony pelvis are obtained. Displaced fractures of the right superior and inferior pubic rami are again noted and unchanged. The remainder of the bony pelvis is unremarkable. Hips are well aligned. Mild symmetrical bilateral hip osteoarthritis. Sacroiliac joints are normal. IMPRESSION: 1. Right superior and inferior pubic rami fractures unchanged since prior exam. Electronically Signed   By: Randa Ngo M.D.   On: 04/02/2022 18:44   DG Hip Unilat W or Wo Pelvis 2-3 Views Right  Result Date: 03/29/2022 CLINICAL DATA:  Hip pain right groin pain.  Difficulty ambulating. EXAM: DG HIP (WITH OR WITHOUT PELVIS) 2-3V RIGHT COMPARISON:  None Available. FINDINGS: There is no evidence of hip fracture or dislocation. There is no evidence of arthropathy or other focal bone abnormality. IMPRESSION: Negative. Electronically Signed   By: San Morelle M.D.   On: 03/29/2022 16:39   DG FEMUR MIN 2 VIEWS LEFT  Result Date: 04/02/2022 CLINICAL DATA:  z a 88 year old female presents for evaluation of bilateral hip pain for 6 months RIGHT greater than LEFT. EXAM: LEFT FEMUR 2 VIEWS COMPARISON:  Contralateral femur, hip and pelvis. FINDINGS: Mild degenerative changes about the LEFT hip. No sign of fracture of the LEFT femur. Soft tissues are unremarkable. Osteopenia. IMPRESSION: Osteopenia and degenerative changes without acute finding. Electronically Signed   By: Zetta Bills M.D.   On: 04/02/2022 13:13   DG FEMUR, MIN 2 VIEWS RIGHT  Result Date: 04/02/2022 CLINICAL DATA:  BILATERAL hip pain for 6 months RIGHT greater than LEFT EXAM: RIGHT FEMUR 2 VIEWS COMPARISON:  03/29/2022 FINDINGS: Osseous demineralization. Narrowing of RIGHT knee joint spaces. RIGHT hip joint space preserved.  Femur intact without fracture or dislocation. However, new displaced fractures are identified at the RIGHT superior and inferior pubic rami. IMPRESSION: Displaced fractures of the RIGHT superior and inferior pubic rami. Intact RIGHT femur. Electronically Signed   By: Lavonia Dana M.D.   On: 04/02/2022 13:13   VAS [place] LOWER EXTREMITY VENOUS (DVT) (7a-7p)  Result Date: 04/02/2022  Lower Venous DVT Study Patient Name:  ALZENA GERBER  Date of Exam:   04/02/2022 Medical Rec #: [id number]   Accession #:    [id number] Date of Birth: [birth year]    Patient Gender: F Patient Age:   41 years Exam [facility]  [facility] Procedure:      VAS [place] LOWER EXTREMITY VENOUS (DVT) Referring Phys: Dorise Bullion --------------------------------------------------------------------------------  Indications: Pain, and  Edema.  Limitations: Body habitus, poor ultrasound/tissue interface and scanning conditions (light on - RN needed to start IV). Comparison Study: Previous LLEV on 11/38/22 was negative for DVT. Performing Technologist: Rogelia Rohrer RVT, RDMS  Examination Guidelines: A complete evaluation includes B-mode imaging, spectral Doppler, color Doppler, and power Doppler as needed of all accessible portions of each vessel. Bilateral testing is considered an integral part of a complete examination. Limited examinations for reoccurring indications may be performed as noted. The reflux portion of the exam is performed with the patient in reverse Trendelenburg.  +--------+---------------+---------+-----------+----------+--------------------+ RIGHT   CompressibilityPhasicitySpontaneityPropertiesThrombus Aging       +--------+---------------+---------+-----------+----------+--------------------+ CFV     Full           Yes      Yes                                       +--------+---------------+---------+-----------+----------+--------------------+ SFJ     Full                                                               +--------+---------------+---------+-----------+----------+--------------------+ FV Prox Full           Yes      Yes                                       +--------+---------------+---------+-----------+----------+--------------------+ FV Mid  Full           Yes      Yes                                       +--------+---------------+---------+-----------+----------+--------------------+ FV      Full           Yes      Yes                                       Distal                                                                    +--------+---------------+---------+-----------+----------+--------------------+ PFV                    Yes      Yes                  patent by  color/doppler        +--------+---------------+---------+-----------+----------+--------------------+ POP     Full           Yes      Yes                                       +--------+---------------+---------+-----------+----------+--------------------+ PTV                                                  Not visualized       +--------+---------------+---------+-----------+----------+--------------------+ PERO    Full                                                              +--------+---------------+---------+-----------+----------+--------------------+   Right Technical Findings: Not visualized segments include posterior tibial veins.  +--------+---------------+---------+-----------+----------+--------------------+ LEFT    CompressibilityPhasicitySpontaneityPropertiesThrombus Aging       +--------+---------------+---------+-----------+----------+--------------------+ CFV     Full           Yes      Yes                                       +--------+---------------+---------+-----------+----------+--------------------+ SFJ     Full                                                               +--------+---------------+---------+-----------+----------+--------------------+ FV Prox Full           Yes      Yes                                       +--------+---------------+---------+-----------+----------+--------------------+ FV Mid  Full           Yes      Yes                                       +--------+---------------+---------+-----------+----------+--------------------+ FV      Full           Yes      Yes                                       Distal                                                                    +--------+---------------+---------+-----------+----------+--------------------+  PFV                    Yes      Yes                  patent by                                                                 color/doppler        +--------+---------------+---------+-----------+----------+--------------------+ POP     Full           Yes      Yes                                       +--------+---------------+---------+-----------+----------+--------------------+ PTV     Full                                                              +--------+---------------+---------+-----------+----------+--------------------+ PERO    Full                                                              +--------+---------------+---------+-----------+----------+--------------------+     Summary: BILATERAL: - No evidence of deep vein thrombosis seen in the lower extremities, bilaterally. -No evidence of popliteal cyst, bilaterally.   *See table(s) above for measurements and observations. Electronically signed by Deitra Mayo MD on 04/02/2022 at 4:06:23 PM.    Final     Labs: BNP (last 3 results) Recent Labs    03/29/22 1342 04/02/22 1209  BNP 24.9 70.9   Basic Metabolic Panel: Recent Labs  Lab 03/29/22 1304 04/02/22 1209  NA 140 140  K 4.0 3.6  CL 105 108  CO2 28 27  GLUCOSE 104* 106*  BUN 20 14  CREATININE 0.85 0.88   CALCIUM 11.1* 9.4   Liver Function Tests: Recent Labs  Lab 03/29/22 1304  AST 16  ALT 12  ALKPHOS 119  BILITOT 0.8  PROT 7.2  ALBUMIN 4.1   No results for input(s): LIPASE, AMYLASE in the last 168 hours. No results for input(s): AMMONIA in the last 168 hours. CBC: Recent Labs  Lab 03/29/22 1304 04/02/22 1209  WBC 9.5 7.4  NEUTROABS 6.1 4.5  HGB 11.9* 10.4*  HCT 38.5 33.1*  MCV 92.3 93.5  PLT 370 302   Cardiac Enzymes: No results for input(s): CKTOTAL, CKMB, CKMBINDEX, TROPONINI in the last 168 hours. BNP: Invalid input(s): POCBNP CBG: No results for input(s): GLUCAP in the last 168 hours. D-Dimer No results for input(s): DDIMER in the last 72 hours. Hgb A1c No results for input(s): HGBA1C in the last 72 hours. Lipid Profile No results for input(s): CHOL, HDL, LDLCALC, TRIG, CHOLHDL, LDLDIRECT in the last 72 hours. Thyroid function studies No results for input(s): TSH, T4TOTAL, T3FREE,  THYROIDAB in the last 72 hours.  Invalid input(s): FREET3 Anemia work up No results for input(s): VITAMINB12, FOLATE, FERRITIN, TIBC, IRON, RETICCTPCT in the last 72 hours. Urinalysis    Component Value Date/Time   COLORURINE STRAW (A) 04/02/2022 2200   APPEARANCEUR CLEAR 04/02/2022 2200   LABSPEC 1.008 04/02/2022 2200   PHURINE 8.0 04/02/2022 2200   GLUCOSEU NEGATIVE 04/02/2022 2200   GLUCOSEU NEGATIVE 02/28/2022 1425   HGBUR NEGATIVE 04/02/2022 2200   BILIRUBINUR NEGATIVE 04/02/2022 2200   BILIRUBINUR neg 04/30/2016 1313   KETONESUR NEGATIVE 04/02/2022 2200   PROTEINUR NEGATIVE 04/02/2022 2200   UROBILINOGEN 0.2 02/28/2022 1425   NITRITE POSITIVE (A) 04/02/2022 2200   LEUKOCYTESUR SMALL (A) 04/02/2022 2200   Sepsis Labs Invalid input(s): PROCALCITONIN,  WBC,  LACTICIDVEN Microbiology No results found for this or any previous visit (from the past 240 hour(s)).   Time coordinating discharge: 25 minutes  SIGNED: Antonieta Pert, MD  [facility] 04/03/2022, 3:16  PM  If 7PM-7AM, please contact night-coverage [email or web address]

## 2022-04-03 NOTE — Progress Notes (Addendum)
Initial Nutrition Assessment  DOCUMENTATION CODES:   Severe malnutrition in context of social or environmental circumstances  INTERVENTION:   Ensure Enlive po BID, each supplement provides 350 kcal and 20 grams of protein.  MVI with minerals daily.  NUTRITION DIAGNOSIS:   Severe Malnutrition related to social / environmental circumstances, chronic illness (dementia) as evidenced by severe muscle depletion, severe fat depletion.  GOAL:   Patient will meet greater than or equal to 90% of their needs  MONITOR:   PO intake, Supplement acceptance, Labs  REASON FOR ASSESSMENT:   Consult Hip fracture protocol  ASSESSMENT:   88 yo female admitted with closed fracture of multiple pubic rami. PMH includes HLD, dementia, recent UTI.  Plans for non-operative treatment of multiple pubic rami fractures.   Spoke with patient and her daughter at bedside. Patient is confused, so daughter provided history. Patient lives with her sons who make sure she has breakfast, lunch, and dinner every day. Daughter bought some PO supplements for patient, but not sure she is still drinking them. She typically has a good appetite and weight has been stable ~130 lbs. She has had edema in her lower legs for the past year. Daughter concerned about a potential blood clot and has been trying to get an appointment with the [facility] specialists in [place].   Labs reviewed. Medications reviewed and include Colace.  Weight history reviewed.  No significant weight changes noted.  Patient meets criteria for severe malnutrition, given severe depletion of muscle and subcutaneous fat mass.  NUTRITION - FOCUSED PHYSICAL EXAM:  Flowsheet Row Most Recent Value  Orbital Region Moderate depletion  Upper Arm Region Severe depletion  Thoracic and Lumbar Region Severe depletion  Buccal Region Moderate depletion  Temple Region Severe depletion  Clavicle Bone Region Severe depletion  Clavicle and Acromion  Bone Region Severe depletion  Scapular Bone Region Severe depletion  Dorsal Hand Severe depletion  Patellar Region Moderate depletion  Anterior Thigh Region Moderate depletion  Posterior Calf Region Moderate depletion  Edema (RD Assessment) Moderate  Hair Reviewed  Eyes Reviewed  Mouth Reviewed  Skin Reviewed  Nails Reviewed       Diet Order:   Diet Order             Diet regular Room service appropriate? Yes; Fluid consistency: Thin  Diet effective now                   EDUCATION NEEDS:   No education needs have been identified at this time  Skin:  Skin Assessment: Reviewed RN Assessment  Last BM:  no BM documented  Height:   Ht Readings from Last 1 Encounters:  03/29/22 5' (1.524 m)    Weight:   Wt Readings from Last 1 Encounters:  03/29/22 60.3 kg     BMI:  25.96  Estimated Nutritional Needs:   Kcal:  1600-1800  Protein:  75-85 gm  Fluid:  >/= 1.6 L    Lucas Mallow RD, LDN, CNSC Please refer to Amion for contact information.

## 2022-04-03 NOTE — Progress Notes (Signed)
PROGRESS NOTE Sabrina Cervantes  FYB:[id number] DOB: 1929-05-18 DOA: 04/02/2022 PCP: Einar Pheasant, MD   Brief Narrative/Hospital Course: 88 y.o. F W/ HLD, dementia, and hyperparathyroidism s/p parathyroidectomy presenting with leg pain and ambulatory dysfunction.She was seen for similar symptoms on 5/26 and diagnosed with a UTI.  Thursday, she called and said "she was in a bad way."  She was in her recliner and she was hurting. They got her up to the bathroom and she couldn't stand on her own. They got a walker and helped her to the bathroom and back.  Friday, she went to the ER at [facility] and she had an xray.  Last known fall was 3-4 weeks ago, could have fallen again.  Xray was negative and she was diagnosed with UTI, seemed to do ok with a couple of days but not peeing much.  She complained of dysuria - but hard to know with dementia.  Her daughter called her PCP this AM and reported inability to get up, ongoing pain.  She was instructed to bring her back to the ER.  +frequency, small voids In the ED found to have pelvic fracture closed orthopedic was consulted and patient was admitted      Subjective: Seen and examined this morning patient resting comfortably.  Daughter at the bedside pleasantly confused at baseline oriented to place people.  Assessment and Plan: Principal Problem:   Closed fracture of multiple pubic rami, initial encounter ([place]) Active Problems:   Hypercholesterolemia   Dementia without behavioral disturbance (HCC)   UTI (urinary tract infection)   DNR (do not resuscitate)   Closed fracture of multiple pubic rami, initial encounter: Appreciate orthopedic input plan for nonoperative treatment with WBAT, PT OT pain control and follow-up with orthopedic at [facility] or Dr. Mable Fill in 3 to 4 weeks  UTI: UA abnormal with pyuria but no culture on 5/26 continue cephalexin follow-up urine culture  Hypercholesterolemia: Continue simvastatin Dementia without behavioral disturbance:  Supportive care PT OT delirium precaution.  Patient gets behavioral symptoms in the hospital, CT head with mild to moderate cerebral atrophy and small vessel ischemic changes. DVT prophylaxis: SCDs Start: 04/02/22 1449 Code Status:   Code Status: DNR Family Communication: plan of care discussed with patient daughter at bedside. Patient status is: inpatient because of ongoing PT OT eval and placement Level of care: Med-Surg   Dispo: The patient is from: Home            Anticipated disposition: Skilled nursing facility  Mobility Assessment (last 72 hours)     Mobility Assessment     Row Name 04/03/22 0400 04/02/22 2000 04/02/22 16:18:25       Does patient have an order for bedrest or is patient medically unstable No - Continue assessment No - Continue assessment No - Continue assessment     What is the highest level of mobility based on the progressive mobility assessment? Level 1 (Bedfast) - Unable to balance while sitting on edge of bed Level 1 (Bedfast) - Unable to balance while sitting on edge of bed Level 4 (Walks with assist in room) - Balance while marching in place and cannot step forward and back - Complete     Is the above level different from baseline mobility prior to current illness? Yes - Recommend PT order Yes - Recommend PT order --               Objective: Vitals last 24 hrs: Vitals:   04/02/22 1618 04/02/22 1937 04/03/22 0428 04/03/22  0816  BP: (!) 145/63 134/61 133/70 (!) 152/67  Pulse: 70 77 66 77  Resp: '17 19 17 16  '$ Temp: 97.8 F (36.6 C) 98.1 F (36.7 C) 97.7 F (36.5 C) 98 F (36.7 C)  TempSrc: Oral Oral Oral Oral  SpO2: 100% 98% 99% 98%   Weight change:   Physical Examination:  General exam: alert awake oriented x2,older than stated age, weak appearing. HEENT:Oral mucosa moist, Ear/Nose WNL grossly, dentition normal. Respiratory system: bilaterally diminished BS, no use of accessory muscle Cardiovascular system: S1 & S2 +, No  JVD. Gastrointestinal system: Abdomen soft,NT,ND, BS+ Nervous System:Alert, awake, moving extremities and grossly nonfocal Extremities: LE edema none,distal peripheral pulses palpable.  Skin: No rashes,no icterus. MSK: Normal muscle bulk,tone, power  Medications reviewed: Scheduled Meds:  cephALEXin  500 mg Oral TID   docusate sodium  100 mg Oral BID   ipratropium  1 spray Each Nare Q12H   simvastatin  40 mg Oral QPM   Continuous Infusions:  methocarbamol (ROBAXIN) IV      Diet Order             Diet regular Room service appropriate? Yes; Fluid consistency: Thin  Diet effective now                  Intake/Output Summary (Last 24 hours) at 04/03/2022 1032 Last data filed at 04/03/2022 0457 Gross per 24 hour  Intake 120 ml  Output 1850 ml  Net -1730 ml   Net IO Since Admission: -1,730 mL [04/03/22 1032]  Wt Readings from Last 3 Encounters:  03/29/22 60.3 kg  02/28/22 61.7 kg  02/28/22 61.7 kg     Unresulted Labs (From admission, onward)     Start     Ordered   04/02/22 1448  Urine Culture  Once,   R       Question:  Indication  Answer:  Dysuria   04/02/22 1453          Data Reviewed: I have personally reviewed following labs and imaging studies CBC: Recent Labs  Lab 03/29/22 1304 04/02/22 1209  WBC 9.5 7.4  NEUTROABS 6.1 4.5  HGB 11.9* 10.4*  HCT 38.5 33.1*  MCV 92.3 93.5  PLT 370 741   Basic Metabolic Panel: Recent Labs  Lab 03/29/22 1304 04/02/22 1209  NA 140 140  K 4.0 3.6  CL 105 108  CO2 28 27  GLUCOSE 104* 106*  BUN 20 14  CREATININE 0.85 0.88  CALCIUM 11.1* 9.4   GFR: Estimated Creatinine Clearance: 33.8 mL/min (by C-G formula based on SCr of 0.88 mg/dL). Liver Function Tests: Recent Labs  Lab 03/29/22 1304  AST 16  ALT 12  ALKPHOS 119  BILITOT 0.8  PROT 7.2  ALBUMIN 4.1   No results found for this or any previous visit (from the past 240 hour(s)).  Antimicrobials: Anti-infectives (From admission, onward)    Start      Dose/Rate Route Frequency Ordered Stop   04/02/22 1600  cephALEXin (KEFLEX) capsule 500 mg        500 mg Oral 3 times daily 04/02/22 1453       Culture/Microbiology    Component Value Date/Time   SDES  06/16/2021 0015    URINE, CLEAN CATCH Performed at [facility], [address] [place], [place] [address]    [facility]  06/16/2021 0015    NONE Performed at [facility], [address] [place],  [address]    CULT >=100,000 COLONIES/mL ESCHERICHIA  COLI (A) 06/16/2021 0015   REPTSTATUS 06/18/2021 FINAL 06/16/2021 0015  Radiology Studies: CT HEAD WO CONTRAST (5MM)  Result Date: 04/02/2022 CLINICAL DATA:  Head trauma, minor. Dementia. Possible fall. Increased confusion/lethargy. EXAM: CT HEAD WITHOUT CONTRAST TECHNIQUE: Contiguous axial images were obtained from the base of the skull through the vertex without intravenous contrast. RADIATION DOSE REDUCTION: This exam was performed according to the departmental dose-optimization program which includes automated exposure control, adjustment of the mA and/or kV according to patient size and/or use of iterative reconstruction technique. COMPARISON:  No pertinent prior exams available for comparison. FINDINGS: Brain: Mild-to-moderate cerebral atrophy. Comparatively mild cerebellar atrophy. Mild patchy and ill-defined hypoattenuation within the cerebral white matter, nonspecific but compatible with chronic small vessel ischemic disease. Partially empty sella turcica. There is no acute intracranial hemorrhage. No demarcated cortical infarct. No extra-axial fluid collection. No evidence of an intracranial mass. No midline shift. Vascular: No hyperdense vessel. Atherosclerotic calcifications. Skull: No fracture or aggressive osseous lesion. Sinuses/Orbits: No mass or acute finding within the imaged orbits. Trace mucosal thickening within the bilateral ethmoid and left maxillary sinuses. IMPRESSION: No evidence of acute  intracranial abnormality. Mild chronic small vessel ischemic changes within the cerebral white matter. Mild-to-moderate cerebral atrophy. Comparatively mild cerebellar atrophy. Electronically Signed   By: Kellie Simmering D.O.   On: 04/02/2022 15:06   CT PELVIS WO CONTRAST  Result Date: 04/02/2022 CLINICAL DATA:  Pelvic fracture EXAM: CT PELVIS WITHOUT CONTRAST TECHNIQUE: Multidetector CT imaging of the pelvis was performed following the standard protocol without intravenous contrast. RADIATION DOSE REDUCTION: This exam was performed according to the departmental dose-optimization program which includes automated exposure control, adjustment of the mA and/or kV according to patient size and/or use of iterative reconstruction technique. COMPARISON:  04/02/2022 FINDINGS: Urinary Tract: Distal ureters and bladder are unremarkable. No urinary tract calculi. Bowel: No bowel obstruction or ileus. Sigmoid diverticulosis without diverticulitis. Normal appendix right lower quadrant. No bowel wall thickening or inflammatory change. Vascular/Lymphatic: No significant vascular findings. No pathologic adenopathy. Reproductive:  Uterus is surgically absent.  No adnexal masses. Other: No free fluid or free intraperitoneal gas. No abdominal wall hernia. Musculoskeletal: Minimally displaced right superior and inferior pubic rami fractures are identified, with near anatomic alignment. There is abnormal sclerosis within the right sacral ala, most consistent with chronic insufficiency fracture. Slight cortical discontinuity within the inferior aspect of the right sacral ala abutting the SI joint consistent with acute fracture line. This is nondisplaced. The remainder of the bony pelvis is unremarkable. The hips are well aligned, with symmetrical bilateral hip osteoarthritis. Reconstructed images demonstrate no additional findings. IMPRESSION: 1. Acute displaced right superior and inferior pubic rami fractures as seen on recent x-rays.  2. Findings consistent with right sacral ala insufficiency fracture, with acute fracture line along the inferior margin of the right sacral ala abutting the SI joint. This is nondisplaced. 3. Symmetrical bilateral hip osteoarthritis. 4. Sigmoid diverticulosis without diverticulitis. Electronically Signed   By: Randa Ngo M.D.   On: 04/02/2022 19:11   DG Pelvis Comp Min 3V  Result Date: 04/02/2022 CLINICAL DATA:  Right leg pain, fell EXAM: JUDET PELVIS - 3+ VIEW COMPARISON:  04/02/2022, 03/29/2022 FINDINGS: Frontal, pelvic inlet, and pelvic outlet views of the bony pelvis are obtained. Displaced fractures of the right superior and inferior pubic rami are again noted and unchanged. The remainder of the bony pelvis is unremarkable. Hips are well aligned. Mild symmetrical bilateral hip osteoarthritis. Sacroiliac joints are normal. IMPRESSION: 1. Right superior and inferior  pubic rami fractures unchanged since prior exam. Electronically Signed   By: Randa Ngo M.D.   On: 04/02/2022 18:44   DG FEMUR MIN 2 VIEWS LEFT  Result Date: 04/02/2022 CLINICAL DATA:  z a 88 year old female presents for evaluation of bilateral hip pain for 6 months RIGHT greater than LEFT. EXAM: LEFT FEMUR 2 VIEWS COMPARISON:  Contralateral femur, hip and pelvis. FINDINGS: Mild degenerative changes about the LEFT hip. No sign of fracture of the LEFT femur. Soft tissues are unremarkable. Osteopenia. IMPRESSION: Osteopenia and degenerative changes without acute finding. Electronically Signed   By: Zetta Bills M.D.   On: 04/02/2022 13:13   DG FEMUR, MIN 2 VIEWS RIGHT  Result Date: 04/02/2022 CLINICAL DATA:  BILATERAL hip pain for 6 months RIGHT greater than LEFT EXAM: RIGHT FEMUR 2 VIEWS COMPARISON:  03/29/2022 FINDINGS: Osseous demineralization. Narrowing of RIGHT knee joint spaces. RIGHT hip joint space preserved. Femur intact without fracture or dislocation. However, new displaced fractures are identified at the RIGHT superior  and inferior pubic rami. IMPRESSION: Displaced fractures of the RIGHT superior and inferior pubic rami. Intact RIGHT femur. Electronically Signed   By: Lavonia Dana M.D.   On: 04/02/2022 13:13   VAS [place] LOWER EXTREMITY VENOUS (DVT) (7a-7p)  Result Date: 04/02/2022  Lower Venous DVT Study Patient Name:  Sabrina Cervantes  Date of Exam:   04/02/2022 Medical Rec #: [id number]   Accession #:    [id number] Date of Birth: 1929/10/14    Patient Gender: F Patient Age:   81 years Exam Location:  [facility] Procedure:      VAS [place] LOWER EXTREMITY VENOUS (DVT) Referring Phys: Dorise Bullion --------------------------------------------------------------------------------  Indications: Pain, and Edema.  Limitations: Body habitus, poor ultrasound/tissue interface and scanning conditions (light on - RN needed to start IV). Comparison Study: Previous LLEV on 11/38/22 was negative for DVT. Performing Technologist: Rogelia Rohrer RVT, RDMS  Examination Guidelines: A complete evaluation includes B-mode imaging, spectral Doppler, color Doppler, and power Doppler as needed of all accessible portions of each vessel. Bilateral testing is considered an integral part of a complete examination. Limited examinations for reoccurring indications may be performed as noted. The reflux portion of the exam is performed with the patient in reverse Trendelenburg.  +--------+---------------+---------+-----------+----------+--------------------+ RIGHT   CompressibilityPhasicitySpontaneityPropertiesThrombus Aging       +--------+---------------+---------+-----------+----------+--------------------+ CFV     Full           Yes      Yes                                       +--------+---------------+---------+-----------+----------+--------------------+ SFJ     Full                                                              +--------+---------------+---------+-----------+----------+--------------------+ FV Prox Full           Yes       Yes                                       +--------+---------------+---------+-----------+----------+--------------------+ FV Mid  Full  Yes      Yes                                       +--------+---------------+---------+-----------+----------+--------------------+ FV      Full           Yes      Yes                                       Distal                                                                    +--------+---------------+---------+-----------+----------+--------------------+ PFV                    Yes      Yes                  patent by                                                                 color/doppler        +--------+---------------+---------+-----------+----------+--------------------+ POP     Full           Yes      Yes                                       +--------+---------------+---------+-----------+----------+--------------------+ PTV                                                  Not visualized       +--------+---------------+---------+-----------+----------+--------------------+ PERO    Full                                                              +--------+---------------+---------+-----------+----------+--------------------+   Right Technical Findings: Not visualized segments include posterior tibial veins.  +--------+---------------+---------+-----------+----------+--------------------+ LEFT    CompressibilityPhasicitySpontaneityPropertiesThrombus Aging       +--------+---------------+---------+-----------+----------+--------------------+ CFV     Full           Yes      Yes                                       +--------+---------------+---------+-----------+----------+--------------------+ SFJ     Full                                                              +--------+---------------+---------+-----------+----------+--------------------+  FV Prox Full           Yes       Yes                                       +--------+---------------+---------+-----------+----------+--------------------+ FV Mid  Full           Yes      Yes                                       +--------+---------------+---------+-----------+----------+--------------------+ FV      Full           Yes      Yes                                       Distal                                                                    +--------+---------------+---------+-----------+----------+--------------------+ PFV                    Yes      Yes                  patent by                                                                 color/doppler        +--------+---------------+---------+-----------+----------+--------------------+ POP     Full           Yes      Yes                                       +--------+---------------+---------+-----------+----------+--------------------+ PTV     Full                                                              +--------+---------------+---------+-----------+----------+--------------------+ PERO    Full                                                              +--------+---------------+---------+-----------+----------+--------------------+     Summary: BILATERAL: - No evidence of deep vein thrombosis seen in the lower extremities, bilaterally. -No evidence of popliteal cyst, bilaterally.   *See table(s) above for measurements and  observations. Electronically signed by Deitra Mayo MD on 04/02/2022 at 4:06:23 PM.    Final      LOS: 1 day   Antonieta Pert, MD [facility]  04/03/2022, 10:32 AM

## 2022-04-03 NOTE — Care Management CC44 (Signed)
Condition Code 44 Documentation Completed  Patient Details  Name: Sabrina Cervantes MRN: [id number] Date of Birth: [birth year]   Condition Code 44 given:  Yes Patient signature on Condition Code 44 notice:  Yes Documentation of 2 MD's agreement:  Yes Code 44 added to claim:  Yes    Sharin Mons, RN 04/03/2022, 4:15 PM

## 2022-04-03 NOTE — Care Management Obs Status (Signed)
[place] NOTIFICATION   Patient Details  Name: Sabrina Cervantes MRN: [id number] Date of Birth: [birth year]   Medicare Observation Status Notification Given:  Yes    Sharin Mons, RN 04/03/2022, 4:15 PM

## 2022-04-03 NOTE — Hospital Course (Addendum)
88 y.o. F W/ HLD, dementia, and hyperparathyroidism s/p parathyroidectomy presenting with leg pain and ambulatory dysfunction.She was seen for similar symptoms on 5/26 and diagnosed with a UTI.  Thursday, she called and said "she was in a bad way."  She was in her recliner and she was hurting. They got her up to the bathroom and she couldn't stand on her own. They got a walker and helped her to the bathroom and back.  Friday, she went to the ER at [facility] and she had an xray.  Last known fall was 3-4 weeks ago, could have fallen again.  Xray was negative and she was diagnosed with UTI, seemed to do ok with a couple of days but not peeing much.  She complained of dysuria - but hard to know with dementia.  Her daughter called her PCP this AM and reported inability to get up, ongoing pain.  She was instructed to bring her back to the ER.  +frequency, small voids In the ED found to have pelvic fracture closed orthopedic was consulted and patient was admitted

## 2022-04-04 ENCOUNTER — Telehealth: Payer: Self-pay | Admitting: Internal Medicine

## 2022-04-04 ENCOUNTER — Telehealth: Payer: Self-pay

## 2022-04-04 LAB — URINE CULTURE: Culture: 50000 — AB

## 2022-04-04 NOTE — Telephone Encounter (Signed)
Lm for pt to cb.

## 2022-04-04 NOTE — Telephone Encounter (Signed)
-----   Message from Einar Pheasant, MD sent at 04/04/2022  4:01 PM EDT ----- Please call and notify urine culture - bacteria present.  Confirm no abx allergies.  Confirm doing ok.  Would recommend stopping keflex and start omnicef '300mg'$  bid x 5 days.  Let me know if any problems.

## 2022-04-04 NOTE — Telephone Encounter (Signed)
Patient was in the hospital. Daughter called and wanted to speak to [facility] about the hospital follow. [facility] office tried to schedule follow up, but daughter wanted to speak to [place] first. Also, at the time of call no hospital follow up appointments.

## 2022-04-05 ENCOUNTER — Other Ambulatory Visit: Payer: Self-pay

## 2022-04-05 ENCOUNTER — Telehealth: Payer: Self-pay

## 2022-04-05 DIAGNOSIS — Z9181 History of falling: Secondary | ICD-10-CM | POA: Diagnosis not present

## 2022-04-05 DIAGNOSIS — K219 Gastro-esophageal reflux disease without esophagitis: Secondary | ICD-10-CM | POA: Diagnosis not present

## 2022-04-05 DIAGNOSIS — S32591D Other specified fracture of right pubis, subsequent encounter for fracture with routine healing: Secondary | ICD-10-CM | POA: Diagnosis not present

## 2022-04-05 DIAGNOSIS — Z9089 Acquired absence of other organs: Secondary | ICD-10-CM | POA: Diagnosis not present

## 2022-04-05 DIAGNOSIS — F32A Depression, unspecified: Secondary | ICD-10-CM | POA: Diagnosis not present

## 2022-04-05 DIAGNOSIS — M1612 Unilateral primary osteoarthritis, left hip: Secondary | ICD-10-CM | POA: Diagnosis not present

## 2022-04-05 DIAGNOSIS — Z66 Do not resuscitate: Secondary | ICD-10-CM | POA: Diagnosis not present

## 2022-04-05 DIAGNOSIS — E213 Hyperparathyroidism, unspecified: Secondary | ICD-10-CM | POA: Diagnosis not present

## 2022-04-05 DIAGNOSIS — M810 Age-related osteoporosis without current pathological fracture: Secondary | ICD-10-CM | POA: Diagnosis not present

## 2022-04-05 DIAGNOSIS — D649 Anemia, unspecified: Secondary | ICD-10-CM | POA: Diagnosis not present

## 2022-04-05 DIAGNOSIS — E7800 Pure hypercholesterolemia, unspecified: Secondary | ICD-10-CM | POA: Diagnosis not present

## 2022-04-05 DIAGNOSIS — S32511D Fracture of superior rim of right pubis, subsequent encounter for fracture with routine healing: Secondary | ICD-10-CM | POA: Diagnosis not present

## 2022-04-05 DIAGNOSIS — F0390 Unspecified dementia without behavioral disturbance: Secondary | ICD-10-CM | POA: Diagnosis not present

## 2022-04-05 DIAGNOSIS — Z85828 Personal history of other malignant neoplasm of skin: Secondary | ICD-10-CM | POA: Diagnosis not present

## 2022-04-05 DIAGNOSIS — N390 Urinary tract infection, site not specified: Secondary | ICD-10-CM | POA: Diagnosis not present

## 2022-04-05 DIAGNOSIS — H9190 Unspecified hearing loss, unspecified ear: Secondary | ICD-10-CM | POA: Diagnosis not present

## 2022-04-05 DIAGNOSIS — F419 Anxiety disorder, unspecified: Secondary | ICD-10-CM | POA: Diagnosis not present

## 2022-04-05 MED ORDER — CEFDINIR 300 MG PO CAPS: 300.0000 mg | ORAL_CAPSULE | Freq: Two times a day (BID) | ORAL | 0 refills | Status: DC

## 2022-04-05 NOTE — Telephone Encounter (Signed)
Please call Sabrina Cervantes at [phone], she has questions about results.

## 2022-04-05 NOTE — Telephone Encounter (Signed)
Transition Care Management Unsuccessful Follow-up Telephone Call  Date of discharge and from where:  04/03/22 [facility]  Attempts:  1st Attempt  Reason for unsuccessful TCM follow-up call:  Left voice message. Patient scheduled 04/11/22 @ 11:00. Keep all scheduled appointments.

## 2022-04-05 NOTE — Telephone Encounter (Signed)
S/w [name] - confirmed NKDA - agreeable to [facility] - rx sent in to [facility]

## 2022-04-08 ENCOUNTER — Ambulatory Visit: Payer: Medicare Other

## 2022-04-08 DIAGNOSIS — S32591D Other specified fracture of right pubis, subsequent encounter for fracture with routine healing: Secondary | ICD-10-CM | POA: Diagnosis not present

## 2022-04-08 DIAGNOSIS — F419 Anxiety disorder, unspecified: Secondary | ICD-10-CM | POA: Diagnosis not present

## 2022-04-08 DIAGNOSIS — D649 Anemia, unspecified: Secondary | ICD-10-CM | POA: Diagnosis not present

## 2022-04-08 DIAGNOSIS — N390 Urinary tract infection, site not specified: Secondary | ICD-10-CM | POA: Diagnosis not present

## 2022-04-08 DIAGNOSIS — F0390 Unspecified dementia without behavioral disturbance: Secondary | ICD-10-CM | POA: Diagnosis not present

## 2022-04-08 DIAGNOSIS — S32511D Fracture of superior rim of right pubis, subsequent encounter for fracture with routine healing: Secondary | ICD-10-CM | POA: Diagnosis not present

## 2022-04-08 NOTE — Telephone Encounter (Signed)
Transition Care Management Unsuccessful Follow-up Telephone Call  Date of discharge and from where:   04/03/22 [facility]  Attempts:  2nd Attempt  Reason for unsuccessful TCM follow-up call:  Unable to reach patient. Will follow

## 2022-04-09 DIAGNOSIS — D649 Anemia, unspecified: Secondary | ICD-10-CM | POA: Diagnosis not present

## 2022-04-09 DIAGNOSIS — F419 Anxiety disorder, unspecified: Secondary | ICD-10-CM | POA: Diagnosis not present

## 2022-04-09 DIAGNOSIS — S32511D Fracture of superior rim of right pubis, subsequent encounter for fracture with routine healing: Secondary | ICD-10-CM | POA: Diagnosis not present

## 2022-04-09 DIAGNOSIS — F0390 Unspecified dementia without behavioral disturbance: Secondary | ICD-10-CM | POA: Diagnosis not present

## 2022-04-09 DIAGNOSIS — N390 Urinary tract infection, site not specified: Secondary | ICD-10-CM | POA: Diagnosis not present

## 2022-04-09 DIAGNOSIS — S32591D Other specified fracture of right pubis, subsequent encounter for fracture with routine healing: Secondary | ICD-10-CM | POA: Diagnosis not present

## 2022-04-09 NOTE — Telephone Encounter (Signed)
Transition Care Management Unsuccessful Follow-up Telephone Call  Date of discharge and from where:  5/31 [facility]  Attempts:  Fourth attempt  Reason for unsuccessful TCM follow-up call:  Message received in office okay to call patient back. No answer when called. Keep all scheduled appointments. This closes attempts from [facility] to reach patient. HFU scheduled 04/11/22 @ 11:00.

## 2022-04-09 NOTE — Telephone Encounter (Signed)
Transition Care Management Unsuccessful Follow-up Telephone Call  Date of discharge and from where:  04/03/22 [facility]  Attempts:  3rd Attempt  Reason for unsuccessful TCM follow-up call:  Left voice message again. HFU scheduled 04/11/22 @ 11:00. Keep all scheduled appointments. This closes attempts from [facility] to reach patient.

## 2022-04-09 NOTE — Telephone Encounter (Addendum)
Transition Care Management Follow-up Telephone Call Date of discharge and from where: 04/03/22 [facility] How have you been since you were released from the hospital? Daughter notes a possible bruise/bed sore right glute. Neosporin and non stick pad in use. About the size of a quarter.Seated on doughnut. Intermittent dizziness upon standing. Staying hydrated. Swelling R leg improved with elevating feet. Recliner in use, continuous.  Denies fever,nausea/vomiting,abdominal pain, uncontrolled pain, chest pain,  shortness of breath or any other alarming symptoms. Good appetite.  Any questions or concerns? No  Items Reviewed: Did the pt receive and understand the discharge instructions provided? Yes  Medications obtained and verified? Yes  Any new allergies since your discharge? No  Dietary orders reviewed? Yes, add nutritional shake BID Do you have support at home? Yes   Home Care and Equipment/Supplies: Seen by PT OT care from home health PT noted for placement.    Functional Questionnaire: (I = Independent and D = Dependent) ADLs: Family assist  Bathing/Dressing- D  Eating- I  Maintaining continence- BSC in use.   Transferring/Ambulation- Walker  Managing Meds- Daughter assist  Follow up appointments reviewed:  PCP Hospital f/u appt confirmed? Yes  Scheduled to see PCP on 04/11/22 @ 11:00. [facility] f/u appt confirmed? Yes, scheduled to see orthopedic 04/22/22. Are transportation arrangements needed? No  If their condition worsens, is the pt aware to call PCP or go to the Emergency Dept.? Yes Was the patient provided with contact information for the PCP's office or ED? Yes Was to pt encouraged to call back with questions or concerns? Yes

## 2022-04-11 ENCOUNTER — Ambulatory Visit (INDEPENDENT_AMBULATORY_CARE_PROVIDER_SITE_OTHER): Payer: Medicare Other | Admitting: Internal Medicine

## 2022-04-11 ENCOUNTER — Encounter: Payer: Self-pay | Admitting: Internal Medicine

## 2022-04-11 VITALS — BP 112/60 | HR 66 | Temp 97.6°F | Resp 14 | Ht 60.0 in | Wt 133.0 lb

## 2022-04-11 DIAGNOSIS — K219 Gastro-esophageal reflux disease without esophagitis: Secondary | ICD-10-CM | POA: Diagnosis not present

## 2022-04-11 DIAGNOSIS — Z0289 Encounter for other administrative examinations: Secondary | ICD-10-CM

## 2022-04-11 DIAGNOSIS — N3000 Acute cystitis without hematuria: Secondary | ICD-10-CM

## 2022-04-11 DIAGNOSIS — D500 Iron deficiency anemia secondary to blood loss (chronic): Secondary | ICD-10-CM

## 2022-04-11 DIAGNOSIS — M7989 Other specified soft tissue disorders: Secondary | ICD-10-CM

## 2022-04-11 DIAGNOSIS — S32599A Other specified fracture of unspecified pubis, initial encounter for closed fracture: Secondary | ICD-10-CM

## 2022-04-11 DIAGNOSIS — L989 Disorder of the skin and subcutaneous tissue, unspecified: Secondary | ICD-10-CM

## 2022-04-11 DIAGNOSIS — E538 Deficiency of other specified B group vitamins: Secondary | ICD-10-CM

## 2022-04-11 DIAGNOSIS — E7800 Pure hypercholesterolemia, unspecified: Secondary | ICD-10-CM | POA: Diagnosis not present

## 2022-04-11 DIAGNOSIS — M810 Age-related osteoporosis without current pathological fracture: Secondary | ICD-10-CM | POA: Diagnosis not present

## 2022-04-11 DIAGNOSIS — D649 Anemia, unspecified: Secondary | ICD-10-CM

## 2022-04-11 LAB — URINALYSIS, ROUTINE W REFLEX MICROSCOPIC
Bilirubin Urine: NEGATIVE
Hgb urine dipstick: NEGATIVE
Ketones, ur: NEGATIVE
Nitrite: NEGATIVE
RBC / HPF: NONE SEEN (ref 0–?)
Specific Gravity, Urine: 1.01 (ref 1.000–1.030)
Total Protein, Urine: NEGATIVE
Urine Glucose: NEGATIVE
Urobilinogen, UA: 0.2 (ref 0.0–1.0)
pH: 7.5 (ref 5.0–8.0)

## 2022-04-11 LAB — BASIC METABOLIC PANEL
BUN: 17 mg/dL (ref 6–23)
CO2: 29 mEq/L (ref 19–32)
Calcium: 10.2 mg/dL (ref 8.4–10.5)
Chloride: 100 mEq/L (ref 96–112)
Creatinine, Ser: 0.81 mg/dL (ref 0.40–1.20)
GFR: 63.46 mL/min (ref >60.00)
Glucose, Bld: 92 mg/dL (ref 70–99)
Potassium: 4.3 mEq/L (ref 3.5–5.1)
Sodium: 137 mEq/L (ref 135–145)

## 2022-04-11 LAB — CBC WITH DIFFERENTIAL/PLATELET
Basophils Absolute: 0.1 10*3/uL (ref 0.0–0.1)
Basophils Relative: 1.2 % (ref 0.0–3.0)
Eosinophils Absolute: 0.2 10*3/uL (ref 0.0–0.7)
Eosinophils Relative: 2.6 % (ref 0.0–5.0)
HCT: 34 % — ABNORMAL LOW (ref 36.0–46.0)
Hemoglobin: 11.2 g/dL — ABNORMAL LOW (ref 12.0–15.0)
Lymphocytes Relative: 32.4 % (ref 12.0–46.0)
Lymphs Abs: 2.2 10*3/uL (ref 0.7–4.0)
MCHC: 33.1 g/dL (ref 30.0–36.0)
MCV: 89.2 fl (ref 78.0–100.0)
Monocytes Absolute: 0.6 10*3/uL (ref 0.1–1.0)
Monocytes Relative: 8.6 % (ref 3.0–12.0)
Neutro Abs: 3.8 10*3/uL (ref 1.4–7.7)
Neutrophils Relative %: 55.2 % (ref 43.0–77.0)
Platelets: 318 10*3/uL (ref 150.0–400.0)
RBC: 3.81 Mil/uL — ABNORMAL LOW (ref 3.87–5.11)
RDW: 16 % — ABNORMAL HIGH (ref 11.5–15.5)
WBC: 6.9 10*3/uL (ref 4.0–10.5)

## 2022-04-11 LAB — FERRITIN: Ferritin: 70.5 ng/mL (ref 10.0–291.0)

## 2022-04-11 MED ORDER — CYANOCOBALAMIN 1000 MCG/ML IJ SOLN: 1000.0000 ug | Freq: Once | INTRAMUSCULAR | Status: DC

## 2022-04-11 MED ORDER — CYANOCOBALAMIN 1000 MCG/ML IJ SOLN
1000.0000 ug | Freq: Once | INTRAMUSCULAR | Status: AC
  Administered 2022-04-11: 1000 ug via INTRAMUSCULAR

## 2022-04-11 NOTE — Progress Notes (Unsigned)
Patient ID: [name], female   DOB: 07-21-1929, 88 y.o.   MRN: [id number]   Subjective:    Patient ID: Sabrina Cervantes, female    DOB: 1928-12-15, 88 y.o.   MRN: [id number]   Patient here for hospital follow up.   Marland Kitchen   HPI Admitted 04/02/22 - 04/03/22 - after presenting with leg pain difficulty ambulating.  In ED on 04/02/22 - found to ave pelvic fracture.  Orthopedics evaluated.  Recommended - nonoperative treatment with WBAT, PT/OT and pain control. Plan to f/u with ortho as outpatient.  Also diagnosed with UTI.  Initially on keflex.  Changed to [facility] when sensitivities available.  Discharged with home health PT/OT.  She is accompanied today by her daughter and son.  History obtained mostly from the daughter Sabrina Cervantes).  Reports increased frequency of urination.  Does report some pain - post urination.  Is eating some.  No vomiting.  No chest pain.  Breathing appears to be stable.  Small area - skin change - bottom.    Past Medical History:  Diagnosis Date   Anemia    Anxiety    Arthritis    "left hip" (08/15/2016)   Basal cell carcinoma    forehead & nose; [facility]; deep cutting   Depression    GERD (gastroesophageal reflux disease)    Hepatitis    "think I had it as a child"   History of kidney stones    HOH (hard of hearing)    Hypercholesterolemia    Osteoporosis    Pyelonephritis, acute     s/p ureteral stent   Vasomotor rhinitis    chronic   Past Surgical History:  Procedure Laterality Date   ABDOMINAL HYSTERECTOMY  1968   secondary to prolapse, ovaries not removed   BASAL CELL CARCINOMA EXCISION     forehead & nose; [place] cut them off; deep cutting   CATARACT EXTRACTION, BILATERAL Bilateral    COLONOSCOPY     DILATION AND CURETTAGE OF UTERUS     S/P miscarriage   LITHOTRIPSY     Dr Yves Dill, nephrolithiasis   PARATHYROIDECTOMY Left 08/15/2016    inferior minimally invasive /notes 08/15/2016   PARATHYROIDECTOMY Left 08/15/2016   Procedure: LEFT  INFERIOR PARATHYROIDECTOMY;  Surgeon: Armandina Gemma, MD;  Location: [facility];  Service: General;  Laterality: Left;   RETINAL DETACHMENT SURGERY     URETERAL STENT PLACEMENT     Family History  Problem Relation Age of Onset   Skin cancer Father    Prostate cancer Father    Colon cancer Brother    Prostate cancer Brother    Hypertension Sister    Skin cancer Sister    Diabetes Mellitus II Sister    Breast cancer Sister 4   Breast cancer Other        niece   Social History   Socioeconomic History   Marital status: Widowed    Spouse name: Not on file   Number of children: 4   Years of education: Not on file   Highest education level: Not on file  Occupational History   Not on file  Tobacco Use   Smoking status: Never   Smokeless tobacco: Never  Substance and Sexual Activity   Alcohol use: No    Alcohol/week: 0.0 standard drinks of alcohol   Drug use: No   Sexual activity: Never  Other Topics Concern   Not on file  Social History Narrative   Not on file  Social Determinants of Health   Financial Resource Strain: Low Risk  (02/28/2022)   Overall Financial Resource Strain (CARDIA)    Difficulty of Paying Living Expenses: Not hard at all  Food Insecurity: No Food Insecurity (02/28/2022)   Hunger Vital Sign    Worried About Running Out of Food in the Last Year: Never true    Ran Out of Food in the Last Year: Never true  Transportation Needs: No Transportation Needs (02/28/2022)   PRAPARE - Hydrologist (Medical): No    Lack of Transportation (Non-Medical): No  Physical Activity: Not on file  Stress: No Stress Concern Present (02/28/2022)   [place]    Feeling of Stress : Not at all  Social Connections: Unknown (02/28/2022)   Social Connection and Isolation Panel [NHANES]    Frequency of Communication with Friends and Family: Not on file    Frequency of Social Gatherings with  Friends and Family: More than three times a week    Attends Religious Services: Not on file    Active Member of Clubs or Organizations: Not on file    Attends Archivist Meetings: Not on file    Marital Status: Not on file     Review of Systems  Constitutional:  Positive for fatigue. Negative for fever.  HENT:  Negative for congestion and sinus pressure.   Respiratory:  Negative for cough and chest tightness.        Breathing stable.    Cardiovascular:  Negative for chest pain and palpitations.       Increased swelling - pedal and lower extremity swelling.   Gastrointestinal:  Negative for abdominal pain, diarrhea, nausea and vomiting.  Genitourinary:  Positive for dysuria and frequency.  Musculoskeletal:  Negative for joint swelling and myalgias.  Skin:  Negative for color change.       Skin lesion - buttock.    Neurological:  Negative for dizziness and headaches.  Psychiatric/Behavioral:  Negative for agitation and dysphoric mood.        Objective:     BP 112/60 (BP Location: Left Arm, Patient Position: Sitting, Cuff Size: Small)   Pulse 66   Temp 97.6 F (36.4 C) (Temporal)   Resp 14   Ht 5' (1.524 m)   Wt 133 lb (60.3 kg)   SpO2 97%   BMI 25.97 kg/m  Wt Readings from Last 3 Encounters:  04/11/22 133 lb (60.3 kg)  03/29/22 133 lb (60.3 kg)  02/28/22 136 lb (61.7 kg)    Physical Exam Vitals reviewed.  Constitutional:      General: She is not in acute distress.    Appearance: Normal appearance.  HENT:     Head: Normocephalic and atraumatic.     Right Ear: External ear normal.     Left Ear: External ear normal.  Eyes:     General: No scleral icterus.       Right eye: No discharge.        Left eye: No discharge.     Conjunctiva/sclera: Conjunctivae normal.  Neck:     Thyroid: No thyromegaly.  Cardiovascular:     Rate and Rhythm: Normal rate and regular rhythm.  Pulmonary:     Effort: No respiratory distress.     Breath sounds: Normal breath  sounds. No wheezing.  Abdominal:     General: Bowel sounds are normal.     Palpations: Abdomen is soft.  Tenderness: There is no abdominal tenderness.  Musculoskeletal:        General: No swelling or tenderness.     Cervical back: Neck supple. No tenderness.  Lymphadenopathy:     Cervical: No cervical adenopathy.  Skin:    Findings: No rash.     Comments: Small area - buttock - minimal erythema.  No open skin.    Neurological:     Mental Status: She is alert.  Psychiatric:        Mood and Affect: Mood normal.        Behavior: Behavior normal.      Outpatient Encounter Medications as of 04/11/2022  Medication Sig   acetaminophen (TYLENOL) 500 MG tablet Take 1,000 mg by mouth 3 (three) times daily.   CALCIUM CARBONATE-VITAMIN D PO Take 1 tablet by mouth 2 (two) times daily.   Cholecalciferol (VITAMIN D-3) 1000 UNITS CAPS Take 1,000 Units by mouth daily.    CRANBERRY PO Take 1 tablet by mouth daily.   cyanocobalamin (,VITAMIN B-12,) 1000 MCG/ML injection Inject 1,000 mcg into the muscle every 30 (thirty) days.   feeding supplement (ENSURE ENLIVE / ENSURE PLUS) LIQD Take 237 mLs by mouth 2 (two) times daily between meals.   Multiple Vitamins-Iron (MULTIVITAMINS WITH IRON) TABS tablet Take 1 tablet by mouth daily.   Omega-3 Fatty Acids (FISH OIL) 1200 MG CAPS Take 1,200 mg by mouth daily.   Probiotic Product (PROBIOTIC PO) Take 1 tablet by mouth daily.   Propylene Glycol-Glycerin (SOOTHE OP) Place 1 drop into both eyes daily as needed (dry eyes).   simvastatin (ZOCOR) 40 MG tablet Take 1 tablet (40 mg total) by mouth every evening.   traMADol (ULTRAM) 50 MG tablet Take 1 tablet (50 mg total) by mouth 3 (three) times daily as needed for up to 15 doses.   cefdinir (OMNICEF) 300 MG capsule Take 1 capsule (300 mg total) by mouth 2 (two) times daily. (Patient not taking: Reported on 04/11/2022)   ipratropium (ATROVENT) 0.03 % nasal spray Place 1 spray into both nostrils every 12 (twelve)  hours. (Patient not taking: Reported on 04/03/2022)   [EXPIRED] cyanocobalamin ((VITAMIN B-12)) injection 1,000 mcg    [DISCONTINUED] cyanocobalamin ((VITAMIN B-12)) injection 1,000 mcg    No facility-administered encounter medications on file as of 04/11/2022.     Lab Results  Component Value Date   WBC 6.9 04/11/2022   HGB 11.2 (L) 04/11/2022   HCT 34.0 (L) 04/11/2022   PLT 318.0 04/11/2022   GLUCOSE 92 04/11/2022   CHOL 164 02/28/2022   TRIG 105.0 02/28/2022   HDL 57.90 02/28/2022   LDLDIRECT 78.0 04/30/2018   LDLCALC 85 02/28/2022   ALT 12 03/29/2022   AST 16 03/29/2022   NA 137 04/11/2022   K 4.3 04/11/2022   CL 100 04/11/2022   CREATININE 0.81 04/11/2022   BUN 17 04/11/2022   CO2 29 04/11/2022   TSH 3.61 02/28/2022    CT PELVIS WO CONTRAST  Result Date: 04/02/2022 CLINICAL DATA:  Pelvic fracture EXAM: CT PELVIS WITHOUT CONTRAST TECHNIQUE: Multidetector CT imaging of the pelvis was performed following the standard protocol without intravenous contrast. RADIATION DOSE REDUCTION: This exam was performed according to the departmental dose-optimization program which includes automated exposure control, adjustment of the mA and/or kV according to patient size and/or use of iterative reconstruction technique. COMPARISON:  04/02/2022 FINDINGS: Urinary Tract: Distal ureters and bladder are unremarkable. No urinary tract calculi. Bowel: No bowel obstruction or ileus. Sigmoid diverticulosis without diverticulitis. Normal appendix  right lower quadrant. No bowel wall thickening or inflammatory change. Vascular/Lymphatic: No significant vascular findings. No pathologic adenopathy. Reproductive:  Uterus is surgically absent.  No adnexal masses. Other: No free fluid or free intraperitoneal gas. No abdominal wall hernia. Musculoskeletal: Minimally displaced right superior and inferior pubic rami fractures are identified, with near anatomic alignment. There is abnormal sclerosis within the right  sacral ala, most consistent with chronic insufficiency fracture. Slight cortical discontinuity within the inferior aspect of the right sacral ala abutting the SI joint consistent with acute fracture line. This is nondisplaced. The remainder of the bony pelvis is unremarkable. The hips are well aligned, with symmetrical bilateral hip osteoarthritis. Reconstructed images demonstrate no additional findings. IMPRESSION: 1. Acute displaced right superior and inferior pubic rami fractures as seen on recent x-rays. 2. Findings consistent with right sacral ala insufficiency fracture, with acute fracture line along the inferior margin of the right sacral ala abutting the SI joint. This is nondisplaced. 3. Symmetrical bilateral hip osteoarthritis. 4. Sigmoid diverticulosis without diverticulitis. Electronically Signed   By: Randa Ngo M.D.   On: 04/02/2022 19:11   DG Pelvis Comp Min 3V  Result Date: 04/02/2022 CLINICAL DATA:  Right leg pain, fell EXAM: [facility] PELVIS - 3+ VIEW COMPARISON:  04/02/2022, 03/29/2022 FINDINGS: Frontal, pelvic inlet, and pelvic outlet views of the bony pelvis are obtained. Displaced fractures of the right superior and inferior pubic rami are again noted and unchanged. The remainder of the bony pelvis is unremarkable. Hips are well aligned. Mild symmetrical bilateral hip osteoarthritis. Sacroiliac joints are normal. IMPRESSION: 1. Right superior and inferior pubic rami fractures unchanged since prior exam. Electronically Signed   By: Randa Ngo M.D.   On: 04/02/2022 18:44   VAS [place] LOWER EXTREMITY VENOUS (DVT) (7a-7p)  Result Date: 04/02/2022  Lower Venous DVT Study Patient Name:  Sabrina Cervantes  Date of Exam:   04/02/2022 Medical Rec #: [id number]   Accession #:    [id number] Date of Birth: Mar 08, 1929    Patient Gender: F Patient Age:   72 years Exam Location:  [facility] Procedure:      VAS [place] LOWER EXTREMITY VENOUS (DVT) Referring Phys: Dorise Bullion  --------------------------------------------------------------------------------  Indications: Pain, and Edema.  Limitations: Body habitus, poor ultrasound/tissue interface and scanning conditions (light on - RN needed to start IV). Comparison Study: Previous LLEV on 11/38/22 was negative for DVT. Performing Technologist: Rogelia Rohrer RVT, RDMS  Examination Guidelines: A complete evaluation includes B-mode imaging, spectral Doppler, color Doppler, and power Doppler as needed of all accessible portions of each vessel. Bilateral testing is considered an integral part of a complete examination. Limited examinations for reoccurring indications may be performed as noted. The reflux portion of the exam is performed with the patient in reverse Trendelenburg.  +--------+---------------+---------+-----------+----------+--------------------+ RIGHT   CompressibilityPhasicitySpontaneityPropertiesThrombus Aging       +--------+---------------+---------+-----------+----------+--------------------+ CFV     Full           Yes      Yes                                       +--------+---------------+---------+-----------+----------+--------------------+ SFJ     Full                                                              +--------+---------------+---------+-----------+----------+--------------------+  FV Prox Full           Yes      Yes                                       +--------+---------------+---------+-----------+----------+--------------------+ FV Mid  Full           Yes      Yes                                       +--------+---------------+---------+-----------+----------+--------------------+ FV      Full           Yes      Yes                                       Distal                                                                    +--------+---------------+---------+-----------+----------+--------------------+ PFV                    Yes      Yes                   patent by                                                                 color/doppler        +--------+---------------+---------+-----------+----------+--------------------+ POP     Full           Yes      Yes                                       +--------+---------------+---------+-----------+----------+--------------------+ PTV                                                  Not visualized       +--------+---------------+---------+-----------+----------+--------------------+ PERO    Full                                                              +--------+---------------+---------+-----------+----------+--------------------+   Right Technical Findings: Not visualized segments include posterior tibial veins.  +--------+---------------+---------+-----------+----------+--------------------+ LEFT    CompressibilityPhasicitySpontaneityPropertiesThrombus Aging       +--------+---------------+---------+-----------+----------+--------------------+ CFV     Full  Yes      Yes                                       +--------+---------------+---------+-----------+----------+--------------------+ SFJ     Full                                                              +--------+---------------+---------+-----------+----------+--------------------+ FV Prox Full           Yes      Yes                                       +--------+---------------+---------+-----------+----------+--------------------+ FV Mid  Full           Yes      Yes                                       +--------+---------------+---------+-----------+----------+--------------------+ FV      Full           Yes      Yes                                       Distal                                                                    +--------+---------------+---------+-----------+----------+--------------------+ PFV                    Yes      Yes                   patent by                                                                 color/doppler        +--------+---------------+---------+-----------+----------+--------------------+ POP     Full           Yes      Yes                                       +--------+---------------+---------+-----------+----------+--------------------+ PTV     Full                                                              +--------+---------------+---------+-----------+----------+--------------------+  PERO    Full                                                              +--------+---------------+---------+-----------+----------+--------------------+     Summary: BILATERAL: - No evidence of deep vein thrombosis seen in the lower extremities, bilaterally. -No evidence of popliteal cyst, bilaterally.   *See table(s) above for measurements and observations. Electronically signed by Deitra Mayo MD on 04/02/2022 at 4:06:23 PM.    Final    CT HEAD WO CONTRAST (5MM)  Result Date: 04/02/2022 CLINICAL DATA:  Head trauma, minor. Dementia. Possible fall. Increased confusion/lethargy. EXAM: CT HEAD WITHOUT CONTRAST TECHNIQUE: Contiguous axial images were obtained from the base of the skull through the vertex without intravenous contrast. RADIATION DOSE REDUCTION: This exam was performed according to the departmental dose-optimization program which includes automated exposure control, adjustment of the mA and/or kV according to patient size and/or use of iterative reconstruction technique. COMPARISON:  No pertinent prior exams available for comparison. FINDINGS: Brain: Mild-to-moderate cerebral atrophy. Comparatively mild cerebellar atrophy. Mild patchy and ill-defined hypoattenuation within the cerebral white matter, nonspecific but compatible with chronic small vessel ischemic disease. Partially empty sella turcica. There is no acute intracranial hemorrhage. No demarcated cortical infarct. No extra-axial  fluid collection. No evidence of an intracranial mass. No midline shift. Vascular: No hyperdense vessel. Atherosclerotic calcifications. Skull: No fracture or aggressive osseous lesion. Sinuses/Orbits: No mass or acute finding within the imaged orbits. Trace mucosal thickening within the bilateral ethmoid and left maxillary sinuses. IMPRESSION: No evidence of acute intracranial abnormality. Mild chronic small vessel ischemic changes within the cerebral white matter. Mild-to-moderate cerebral atrophy. Comparatively mild cerebellar atrophy. Electronically Signed   By: Kellie Simmering D.O.   On: 04/02/2022 15:06   DG FEMUR, MIN 2 VIEWS RIGHT  Result Date: 04/02/2022 CLINICAL DATA:  BILATERAL hip pain for 6 months RIGHT greater than LEFT EXAM: RIGHT FEMUR 2 VIEWS COMPARISON:  03/29/2022 FINDINGS: Osseous demineralization. Narrowing of RIGHT knee joint spaces. RIGHT hip joint space preserved. Femur intact without fracture or dislocation. However, new displaced fractures are identified at the RIGHT superior and inferior pubic rami. IMPRESSION: Displaced fractures of the RIGHT superior and inferior pubic rami. Intact RIGHT femur. Electronically Signed   By: Lavonia Dana M.D.   On: 04/02/2022 13:13   DG FEMUR MIN 2 VIEWS LEFT  Result Date: 04/02/2022 CLINICAL DATA:  z a 88 year old female presents for evaluation of bilateral hip pain for 6 months RIGHT greater than LEFT. EXAM: LEFT FEMUR 2 VIEWS COMPARISON:  Contralateral femur, hip and pelvis. FINDINGS: Mild degenerative changes about the LEFT hip. No sign of fracture of the LEFT femur. Soft tissues are unremarkable. Osteopenia. IMPRESSION: Osteopenia and degenerative changes without acute finding. Electronically Signed   By: Zetta Bills M.D.   On: 04/02/2022 13:13       Assessment & Plan:   Problem List Items Addressed This Visit     Closed fracture of multiple pubic rami, initial encounter ([place])    Found on recent admission.  Has walker.  Planning home  PT/OT.  Keep f/u with ortho.  Discussed home health.  Discussed need for aid and also wound care.        Encounter for completion of form with patient    Daughter needs FLMA  form completed       GERD (gastroesophageal reflux disease)    Upper symptoms controlled.  On protonix.       Hypercholesterolemia    On simvastatin.  Low cholesterol diet and exercise.  Follow lipid panel and liver function tests.        Iron deficiency anemia    Saw hematology.  Previous attempted iron infusion - had reaction.  She continues with multivitamin with iron.  Recent f/u.  Stable.  No further w/up. Follow cbc and iron studies.       Osteoporosis    Has declined prescription treatment.  Continue calcium, vitamin D.       Skin lesion    Skin lesion - buttock.  Appears to have changes on skin - pressure.  Continue doughnut pillow.  Discussed wound management through home health.  Follow.       Swelling of lower extremity    Bilateral lower extremity swelling.  Have discussed leg elevation.  Has tried compression hose.  Worsens.  Have referred to [facility] previously.  Attempts at leg elevation.  Follow.       UTI (urinary tract infection) - Primary    Recent infection.  Treated.  With dysuria and increased urinary frequency.  Recheck urine to confirm if infection present.       Relevant Orders   Urinalysis, Routine w reflex microscopic (Completed)   Urine Culture (Completed)   Other Visit Diagnoses     B12 deficiency       Relevant Medications   cyanocobalamin ((VITAMIN B-12)) injection 1,000 mcg (Completed)   Anemia, unspecified type       Relevant Medications   cyanocobalamin ((VITAMIN B-12)) injection 1,000 mcg (Completed)   Other Relevant Orders   CBC with Differential/Platelet (Completed)   Basic Metabolic Panel (BMET) (Completed)   Ferritin (Completed)        Einar Pheasant, MD

## 2022-04-12 ENCOUNTER — Telehealth: Payer: Self-pay

## 2022-04-12 ENCOUNTER — Other Ambulatory Visit: Payer: Self-pay

## 2022-04-12 MED ORDER — CIPROFLOXACIN HCL 250 MG PO TABS: 250.0000 mg | ORAL_TABLET | Freq: Two times a day (BID) | ORAL | 0 refills | Status: AC

## 2022-04-12 NOTE — Telephone Encounter (Signed)
Lm for [name] - cipro sent

## 2022-04-12 NOTE — Telephone Encounter (Signed)
Spoke w/ [name] No issue w/ Cipro - picked up rx for Cipro - will start tonight

## 2022-04-12 NOTE — Telephone Encounter (Signed)
LM FOR PT DAUGHTER LINDA re :   Is she allergic to cipro?  Cipro '250mg'$  twice per day sent in to local pharm

## 2022-04-12 NOTE — Telephone Encounter (Signed)
Patient's daughter, Balinda Quails, states patient came in yesterday and gave a urine sample.  [name] states patient hurts when she urinates and they need to give her something to help with this.  *[name] states patient's preferred pharmacy is [facility] on [address].

## 2022-04-12 NOTE — Telephone Encounter (Signed)
She is allergic to pyridium.  Given recent infection and given burning can go ahead and cover while waiting for culture.  If culture negative will need to stop.  We will let her know.  Confirm no allergy to cipro. If no, then cipro '250mg'$  bid x 5 days.  Also, need to make sure taking a probiotic daily while on abx and for two weeks after completing abxl

## 2022-04-13 LAB — URINE CULTURE
MICRO NUMBER:: 13500930
SPECIMEN QUALITY:: ADEQUATE

## 2022-04-14 ENCOUNTER — Encounter: Payer: Self-pay | Admitting: Internal Medicine

## 2022-04-14 DIAGNOSIS — L989 Disorder of the skin and subcutaneous tissue, unspecified: Secondary | ICD-10-CM | POA: Insufficient documentation

## 2022-04-14 DIAGNOSIS — Z0289 Encounter for other administrative examinations: Secondary | ICD-10-CM | POA: Insufficient documentation

## 2022-04-14 NOTE — Assessment & Plan Note (Signed)
On simvastatin.  Low cholesterol diet and exercise.  Follow lipid panel and liver function tests.   

## 2022-04-14 NOTE — Assessment & Plan Note (Signed)
Upper symptoms controlled.  On protonix.  

## 2022-04-14 NOTE — Assessment & Plan Note (Signed)
Skin lesion - buttock.  Appears to have changes on skin - pressure.  Continue doughnut pillow.  Discussed wound management through home health.  Follow.

## 2022-04-14 NOTE — Assessment & Plan Note (Signed)
Saw hematology.  Previous attempted iron infusion - had reaction.  She continues with multivitamin with iron.  Recent f/u.  Stable.  No further w/up. Follow cbc and iron studies.  

## 2022-04-14 NOTE — Assessment & Plan Note (Signed)
Recent infection.  Treated.  With dysuria and increased urinary frequency.  Recheck urine to confirm if infection present.

## 2022-04-14 NOTE — Assessment & Plan Note (Signed)
Daughter needs FLMA form completed

## 2022-04-14 NOTE — Assessment & Plan Note (Signed)
Bilateral lower extremity swelling.  Have discussed leg elevation.  Has tried compression hose.  Worsens.  Have referred to [facility] previously.  Attempts at leg elevation.  Follow.

## 2022-04-14 NOTE — Assessment & Plan Note (Addendum)
Found on recent admission.  Has walker.  Planning home PT/OT.  Keep f/u with ortho.  Discussed home health.  Discussed need for aid and also wound care.

## 2022-04-14 NOTE — Assessment & Plan Note (Signed)
Has declined prescription treatment.  Continue calcium, vitamin D.

## 2022-04-15 NOTE — Telephone Encounter (Signed)
Pt daughter called in stating that Dr. Nicki Reaper called her over the weekend... Pt daughter stated that she missed Dr. Nicki Reaper call... Pt daughter was wondering if Dr. Nicki Reaper filed out her FMLA paper work yet... Pt daughter stated that the paperwork needs to be returned to [place]... fax number for Shawna Orleans is [phone] ; Case Number [id number]... Pt daughter requesting callback.Marland KitchenMarland KitchenMarland Kitchen

## 2022-04-16 DIAGNOSIS — D649 Anemia, unspecified: Secondary | ICD-10-CM | POA: Diagnosis not present

## 2022-04-16 DIAGNOSIS — F0390 Unspecified dementia without behavioral disturbance: Secondary | ICD-10-CM | POA: Diagnosis not present

## 2022-04-16 DIAGNOSIS — F419 Anxiety disorder, unspecified: Secondary | ICD-10-CM | POA: Diagnosis not present

## 2022-04-16 DIAGNOSIS — S32591D Other specified fracture of right pubis, subsequent encounter for fracture with routine healing: Secondary | ICD-10-CM | POA: Diagnosis not present

## 2022-04-16 DIAGNOSIS — N390 Urinary tract infection, site not specified: Secondary | ICD-10-CM | POA: Diagnosis not present

## 2022-04-16 DIAGNOSIS — S32511D Fracture of superior rim of right pubis, subsequent encounter for fracture with routine healing: Secondary | ICD-10-CM | POA: Diagnosis not present

## 2022-04-16 NOTE — Telephone Encounter (Signed)
See other note.  Need to confirm start date.  See form.  Form completed and placed in box.

## 2022-04-16 NOTE — Telephone Encounter (Signed)
S/w [name] - start date is 04/15/22 FMLA paperwork notated. Faxed in to [place]. Copies made for [name] to p/u - left up front - Otho Bellows

## 2022-04-17 ENCOUNTER — Other Ambulatory Visit: Payer: Self-pay

## 2022-04-17 ENCOUNTER — Telehealth: Payer: Self-pay | Admitting: Internal Medicine

## 2022-04-17 DIAGNOSIS — E7800 Pure hypercholesterolemia, unspecified: Secondary | ICD-10-CM | POA: Diagnosis not present

## 2022-04-17 DIAGNOSIS — F419 Anxiety disorder, unspecified: Secondary | ICD-10-CM | POA: Diagnosis not present

## 2022-04-17 DIAGNOSIS — Z85828 Personal history of other malignant neoplasm of skin: Secondary | ICD-10-CM

## 2022-04-17 DIAGNOSIS — M810 Age-related osteoporosis without current pathological fracture: Secondary | ICD-10-CM

## 2022-04-17 DIAGNOSIS — Z9089 Acquired absence of other organs: Secondary | ICD-10-CM

## 2022-04-17 DIAGNOSIS — N390 Urinary tract infection, site not specified: Secondary | ICD-10-CM | POA: Diagnosis not present

## 2022-04-17 DIAGNOSIS — D649 Anemia, unspecified: Secondary | ICD-10-CM | POA: Diagnosis not present

## 2022-04-17 DIAGNOSIS — H9190 Unspecified hearing loss, unspecified ear: Secondary | ICD-10-CM | POA: Diagnosis not present

## 2022-04-17 DIAGNOSIS — S32591D Other specified fracture of right pubis, subsequent encounter for fracture with routine healing: Secondary | ICD-10-CM | POA: Diagnosis not present

## 2022-04-17 DIAGNOSIS — F0390 Unspecified dementia without behavioral disturbance: Secondary | ICD-10-CM | POA: Diagnosis not present

## 2022-04-17 DIAGNOSIS — S32511D Fracture of superior rim of right pubis, subsequent encounter for fracture with routine healing: Secondary | ICD-10-CM | POA: Diagnosis not present

## 2022-04-17 DIAGNOSIS — Z9181 History of falling: Secondary | ICD-10-CM

## 2022-04-17 DIAGNOSIS — E213 Hyperparathyroidism, unspecified: Secondary | ICD-10-CM | POA: Diagnosis not present

## 2022-04-17 DIAGNOSIS — F32A Depression, unspecified: Secondary | ICD-10-CM | POA: Diagnosis not present

## 2022-04-17 DIAGNOSIS — M1612 Unilateral primary osteoarthritis, left hip: Secondary | ICD-10-CM | POA: Diagnosis not present

## 2022-04-17 DIAGNOSIS — Z66 Do not resuscitate: Secondary | ICD-10-CM

## 2022-04-17 DIAGNOSIS — K219 Gastro-esophageal reflux disease without esophagitis: Secondary | ICD-10-CM | POA: Diagnosis not present

## 2022-04-17 MED ORDER — TRAMADOL HCL 50 MG PO TABS: ORAL_TABLET | ORAL | 0 refills | Status: DC

## 2022-04-17 NOTE — Telephone Encounter (Signed)
Pt sched for 2pm tomorrow w/ Dr Olivia Mackie

## 2022-04-17 NOTE — Telephone Encounter (Signed)
Rx sent in.  When sees ortho - they will then prescribe pain meds.

## 2022-04-17 NOTE — Telephone Encounter (Signed)
am ok to schedule with Dr [place] if needs evaluation

## 2022-04-17 NOTE — Telephone Encounter (Signed)
Rx sent in for tramadol #10 with no refills.

## 2022-04-17 NOTE — Telephone Encounter (Signed)
Patient's daughter Sabrina Cervantes called, her mother finished her Ciprofloxacin HCl today. Daughter states that her mother still has pain after urination and is going to the bathroom every 30 minutes.

## 2022-04-17 NOTE — Progress Notes (Signed)
Chief Complaint  Patient presents with   Urinary Tract Infection    Pt still c/o pain with urination after finishing abx, was given cipro by scott. Was having some frequency on Tuesday but none since    F/u with daughter linda 1. Pelvic fracture 3 weeks ago doing pt x 1 session so far and ortho appt Monday pcp gave her tramadol  2. Tx'ed uti 04/11/22 with cirpo bid but has dementia and still c/o pain with urination on Tuesday and yesterday no odor, blood or cloudy  Recurrent uti since 06/2020 she does wear depends but can normally make it to the bathroom does have dribbling of urine at times  She is drinking water   Review of Systems  Constitutional:  Negative for weight loss.  HENT:  Negative for hearing loss.   Eyes:  Negative for blurred vision.  Respiratory:  Negative for shortness of breath.   Cardiovascular:  Negative for chest pain.  Gastrointestinal:  Negative for abdominal pain and blood in stool.  Genitourinary:  Positive for dysuria.  Musculoskeletal:  Negative for falls and joint pain.  Skin:  Negative for rash.  Neurological:  Negative for headaches.  Psychiatric/Behavioral:  Negative for depression.    Past Medical History:  Diagnosis Date   Anemia    Anxiety    Arthritis    "left hip" (08/15/2016)   Basal cell carcinoma    [facility] & [facility]; [facility]; deep cutting   Depression    GERD (gastroesophageal reflux disease)    Hepatitis    "think I had it as a child"   History of kidney stones    HOH (hard of hearing)    Hypercholesterolemia    Osteoporosis    Pyelonephritis, acute     s/p ureteral stent   Vasomotor rhinitis    chronic   Past Surgical History:  Procedure Laterality Date   ABDOMINAL HYSTERECTOMY  1968   secondary to prolapse, ovaries not removed   BASAL CELL CARCINOMA EXCISION     forehead & nose; [place] cut them off; deep cutting   CATARACT EXTRACTION, BILATERAL Bilateral    COLONOSCOPY     DILATION AND CURETTAGE OF  UTERUS     S/P miscarriage   LITHOTRIPSY     Dr Yves Dill, nephrolithiasis   PARATHYROIDECTOMY Left 08/15/2016    inferior minimally invasive /notes 08/15/2016   PARATHYROIDECTOMY Left 08/15/2016   Procedure: LEFT INFERIOR PARATHYROIDECTOMY;  Surgeon: Armandina Gemma, MD;  Location: [facility];  Service: General;  Laterality: Left;   RETINAL DETACHMENT SURGERY     URETERAL STENT PLACEMENT     Family History  Problem Relation Age of Onset   Skin cancer Father    Prostate cancer Father    Colon cancer Brother    Prostate cancer Brother    Hypertension Sister    Skin cancer Sister    Diabetes Mellitus II Sister    Breast cancer Sister 3   Breast cancer Other        niece   Social History   Socioeconomic History   Marital status: Widowed    Spouse name: Not on file   Number of children: 4   Years of education: Not on file   Highest education level: Not on file  Occupational History   Not on file  Tobacco Use   Smoking status: Never   Smokeless tobacco: Never  Substance and Sexual Activity   Alcohol use: No    Alcohol/week: 0.0 standard drinks  of alcohol   Drug use: No   Sexual activity: Never  Other Topics Concern   Not on file  Social History Narrative   Not on file   Social Determinants of Health   Financial Resource Strain: Low Risk  (02/28/2022)   Overall Financial Resource Strain (CARDIA)    Difficulty of Paying Living Expenses: Not hard at all  Food Insecurity: No Food Insecurity (02/28/2022)   Hunger Vital Sign    Worried About Running Out of Food in the Last Year: Never true    Ran Out of Food in the Last Year: Never true  Transportation Needs: No Transportation Needs (02/28/2022)   PRAPARE - Hydrologist (Medical): No    Lack of Transportation (Non-Medical): No  Physical Activity: Not on file  Stress: No Stress Concern Present (02/28/2022)   [place]    Feeling of Stress  : Not at all  Social Connections: Unknown (02/28/2022)   Social Connection and Isolation Panel [NHANES]    Frequency of Communication with Friends and Family: Not on file    Frequency of Social Gatherings with Friends and Family: More than three times a week    Attends Religious Services: Not on file    Active Member of Clubs or Organizations: Not on file    Attends Archivist Meetings: Not on file    Marital Status: Not on file  Intimate Partner Violence: Not At Risk (02/28/2022)   Humiliation, Afraid, Rape, and Kick questionnaire    Fear of Current or Ex-Partner: No    Emotionally Abused: No    Physically Abused: No    Sexually Abused: No   Current Meds  Medication Sig   acetaminophen (TYLENOL) 500 MG tablet Take 1,000 mg by mouth 3 (three) times daily.   CALCIUM CARBONATE-VITAMIN D PO Take 1 tablet by mouth 2 (two) times daily.   Cholecalciferol (VITAMIN D-3) 1000 UNITS CAPS Take 1,000 Units by mouth daily.    ciprofloxacin (CIPRO) 500 MG tablet Take 1 tablet (500 mg total) by mouth 2 (two) times daily for 5 days. With food   CRANBERRY PO Take 1 tablet by mouth daily.   cyanocobalamin (,VITAMIN B-12,) 1000 MCG/ML injection Inject 1,000 mcg into the muscle every 30 (thirty) days.   feeding supplement (ENSURE ENLIVE / ENSURE PLUS) LIQD Take 237 mLs by mouth 2 (two) times daily between meals.   Multiple Vitamins-Iron (MULTIVITAMINS WITH IRON) TABS tablet Take 1 tablet by mouth daily.   Omega-3 Fatty Acids (FISH OIL) 1200 MG CAPS Take 1,200 mg by mouth daily.   Probiotic Product (PROBIOTIC PO) Take 1 tablet by mouth daily.   Propylene Glycol-Glycerin (SOOTHE OP) Place 1 drop into both eyes daily as needed (dry eyes).   simvastatin (ZOCOR) 40 MG tablet Take 1 tablet (40 mg total) by mouth every evening.   traMADol (ULTRAM) 50 MG tablet 1/2 tablet q hs prn and one tablet q day prn with physical therapy.   Allergies  Allergen Reactions   Pyridium [Phenazopyridine Hcl] Itching  and Rash   Recent Results (from the past 2160 hour(s))  CBC w/Diff     Status: Abnormal   Collection Time: 02/28/22  2:25 PM  Result Value Ref Range   WBC 9.3 4.0 - 10.5 K/uL   RBC 3.63 (L) 3.87 - 5.11 Mil/uL   Hemoglobin 10.9 (L) 12.0 - 15.0 g/dL   HCT 32.3 (L) 36.0 - 46.0 %  MCV 89.1 78.0 - 100.0 fl   MCHC 33.7 30.0 - 36.0 g/dL   RDW 14.6 11.5 - 15.5 %   Platelets 332.0 150.0 - 400.0 K/uL   Neutrophils Relative % 68.6 43.0 - 77.0 %   Lymphocytes Relative 20.9 12.0 - 46.0 %   Monocytes Relative 8.8 3.0 - 12.0 %   Eosinophils Relative 1.3 0.0 - 5.0 %   Basophils Relative 0.4 0.0 - 3.0 %   Neutro Abs 6.4 1.4 - 7.7 K/uL   Lymphs Abs 2.0 0.7 - 4.0 K/uL   Monocytes Absolute 0.8 0.1 - 1.0 K/uL   Eosinophils Absolute 0.1 0.0 - 0.7 K/uL   Basophils Absolute 0.0 0.0 - 0.1 K/uL  Basic Metabolic Panel (BMET)     Status: None   Collection Time: 02/28/22  2:25 PM  Result Value Ref Range   Sodium 139 135 - 145 mEq/L   Potassium 3.9 3.5 - 5.1 mEq/L   Chloride 104 96 - 112 mEq/L   CO2 28 19 - 32 mEq/L   Glucose, Bld 92 70 - 99 mg/dL   BUN 22 6 - 23 mg/dL   Creatinine, Ser 0.84 0.40 - 1.20 mg/dL   GFR 60.80 >60.00 mL/min    Comment: Calculated using the CKD-EPI Creatinine Equation (2021)   Calcium 9.1 8.4 - 10.5 mg/dL  Lipid Profile     Status: None   Collection Time: 02/28/22  2:25 PM  Result Value Ref Range   Cholesterol 164 0 - 200 mg/dL    Comment: ATP III Classification       Desirable:  < 200 mg/dL               Borderline High:  200 - 239 mg/dL          High:  > = 240 mg/dL   Triglycerides 105.0 0.0 - 149.0 mg/dL    Comment: Normal:  <150 mg/dLBorderline High:  150 - 199 mg/dL   HDL 57.90 >39.00 mg/dL   VLDL 21.0 0.0 - 40.0 mg/dL   LDL Cholesterol 85 0 - 99 mg/dL   Total CHOL/HDL Ratio 3     Comment:                Men          Women1/2 Average Risk     3.4          3.3Average Risk          5.0          4.42X Average Risk          9.6          7.13X Average Risk          15.0           11.0                       NonHDL 106.38     Comment: NOTE:  Non-HDL goal should be 30 mg/dL higher than patient's LDL goal (i.e. LDL goal of < 70 mg/dL, would have non-HDL goal of < 100 mg/dL)  Hepatic function panel     Status: Abnormal   Collection Time: 02/28/22  2:25 PM  Result Value Ref Range   Total Bilirubin 0.5 0.2 - 1.2 mg/dL   Bilirubin, Direct 0.1 0.0 - 0.3 mg/dL   Alkaline Phosphatase 114 39 - 117 U/L   AST 11 0 - 37 U/L   ALT 6 0 - 35 U/L  Total Protein 5.7 (L) 6.0 - 8.3 g/dL   Albumin 4.0 3.5 - 5.2 g/dL  TSH     Status: None   Collection Time: 02/28/22  2:25 PM  Result Value Ref Range   TSH 3.61 0.35 - 5.50 uIU/mL  Urinalysis, Routine w reflex microscopic     Status: Abnormal   Collection Time: 02/28/22  2:25 PM  Result Value Ref Range   Color, Urine YELLOW Yellow;Lt. Yellow;Straw;Dark Yellow;Amber;Green;Red;Brown   APPearance Cloudy (A) Clear;Turbid;Slightly Cloudy;Cloudy   Specific Gravity, Urine 1.020 1.000 - 1.030   pH 6.0 5.0 - 8.0   Total Protein, Urine TRACE (A) Negative   Urine Glucose NEGATIVE Negative   Ketones, ur NEGATIVE Negative   Bilirubin Urine NEGATIVE Negative   Hgb urine dipstick SMALL (A) Negative   Urobilinogen, UA 0.2 0.0 - 1.0   Leukocytes,Ua SMALL (A) Negative   Nitrite POSITIVE (A) Negative   WBC, UA 11-20/hpf (A) 0-2/hpf   RBC / HPF 0-2/hpf 0-2/hpf   Mucus, UA Presence of (A) None   Squamous Epithelial / LPF Rare(0-4/hpf) Rare(0-4/hpf)   Renal Epithel, UA Rare(0-4/hpf) (A) None   Bacteria, UA Many(>50/hpf) (A) None   Ca Oxalate Crys, UA Presence of (A) None  Ferritin     Status: None   Collection Time: 02/28/22  2:25 PM  Result Value Ref Range   Ferritin 67.0 10.0 - 291.0 ng/mL  VITAMIN D 25 Hydroxy (Vit-D Deficiency, Fractures)     Status: None   Collection Time: 02/28/22  2:25 PM  Result Value Ref Range   VITD 55.92 30.00 - 100.00 ng/mL  Urinalysis, Routine w reflex microscopic     Status: Abnormal   Collection Time:  03/29/22 12:56 PM  Result Value Ref Range   Color, Urine YELLOW (A) YELLOW   APPearance HAZY (A) CLEAR   Specific Gravity, Urine 1.017 1.005 - 1.030   pH 5.0 5.0 - 8.0   Glucose, UA NEGATIVE NEGATIVE mg/dL   Hgb urine dipstick SMALL (A) NEGATIVE   Bilirubin Urine NEGATIVE NEGATIVE   Ketones, ur NEGATIVE NEGATIVE mg/dL   Protein, ur NEGATIVE NEGATIVE mg/dL   Nitrite POSITIVE (A) NEGATIVE   Leukocytes,Ua SMALL (A) NEGATIVE   RBC / HPF 0-5 0 - 5 RBC/hpf   WBC, UA 21-50 0 - 5 WBC/hpf   Bacteria, UA FEW (A) NONE SEEN   Squamous Epithelial / LPF 6-10 0 - 5   Mucus PRESENT     Comment: Performed at [facility], [address] [place], [place] [address]  CBC with Differential     Status: Abnormal   Collection Time: 03/29/22  1:04 PM  Result Value Ref Range   WBC 9.5 4.0 - 10.5 K/uL   RBC 4.17 3.87 - 5.11 MIL/uL   Hemoglobin 11.9 (L) 12.0 - 15.0 g/dL   HCT 38.5 36.0 - 46.0 %   MCV 92.3 80.0 - 100.0 fL   MCH 28.5 26.0 - 34.0 pg   MCHC 30.9 30.0 - 36.0 g/dL   RDW 14.9 11.5 - 15.5 %   Platelets 370 150 - 400 K/uL   nRBC 0.0 0.0 - 0.2 %   Neutrophils Relative % 65 %   Neutro Abs 6.1 1.7 - 7.7 K/uL   Lymphocytes Relative 25 %   Lymphs Abs 2.4 0.7 - 4.0 K/uL   Monocytes Relative 7 %   Monocytes Absolute 0.7 0.1 - 1.0 K/uL   Eosinophils Relative 1 %   Eosinophils Absolute 0.1 0.0 - 0.5 K/uL  Basophils Relative 1 %   Basophils Absolute 0.1 0.0 - 0.1 K/uL   Immature Granulocytes 1 %   Abs Immature Granulocytes 0.08 (H) 0.00 - 0.07 K/uL    Comment: Performed at [facility], [address] [place], [place] [address]  Comprehensive metabolic panel     Status: Abnormal   Collection Time: 03/29/22  1:04 PM  Result Value Ref Range   Sodium 140 135 - 145 mmol/L   Potassium 4.0 3.5 - 5.1 mmol/L   Chloride 105 98 - 111 mmol/L   CO2 28 22 - 32 mmol/L   Glucose, Bld 104 (H) 70 - 99 mg/dL    Comment: Glucose reference range applies only to samples taken after fasting  for at least 8 hours.   BUN 20 8 - 23 mg/dL   Creatinine, Ser 0.85 0.44 - 1.00 mg/dL   Calcium 11.1 (H) 8.9 - 10.3 mg/dL   Total Protein 7.2 6.5 - 8.1 g/dL   Albumin 4.1 3.5 - 5.0 g/dL   AST 16 15 - 41 U/L   ALT 12 0 - 44 U/L   Alkaline Phosphatase 119 38 - 126 U/L   Total Bilirubin 0.8 0.3 - 1.2 mg/dL   GFR, Estimated >60 >60 mL/min    Comment: (NOTE) Calculated using the CKD-EPI Creatinine Equation (2021)    Anion gap 7 5 - 15    Comment: Performed at [facility], [address] [place], [place] [address]  Brain natriuretic peptide     Status: None   Collection Time: 03/29/22  1:42 PM  Result Value Ref Range   B Natriuretic Peptide 24.9 0.0 - 100.0 pg/mL    Comment: Performed at [facility], [address] [place], [place] [address]  CBC with Differential     Status: Abnormal   Collection Time: 04/02/22 12:09 PM  Result Value Ref Range   WBC 7.4 4.0 - 10.5 K/uL   RBC 3.54 (L) 3.87 - 5.11 MIL/uL   Hemoglobin 10.4 (L) 12.0 - 15.0 g/dL   HCT 33.1 (L) 36.0 - 46.0 %   MCV 93.5 80.0 - 100.0 fL   MCH 29.4 26.0 - 34.0 pg   MCHC 31.4 30.0 - 36.0 g/dL   RDW 15.0 11.5 - 15.5 %   Platelets 302 150 - 400 K/uL   nRBC 0.0 0.0 - 0.2 %   Neutrophils Relative % 60 %   Neutro Abs 4.5 1.7 - 7.7 K/uL   Lymphocytes Relative 29 %   Lymphs Abs 2.2 0.7 - 4.0 K/uL   Monocytes Relative 7 %   Monocytes Absolute 0.5 0.1 - 1.0 K/uL   Eosinophils Relative 2 %   Eosinophils Absolute 0.1 0.0 - 0.5 K/uL   Basophils Relative 1 %   Basophils Absolute 0.1 0.0 - 0.1 K/uL   Immature Granulocytes 1 %   Abs Immature Granulocytes 0.04 0.00 - 0.07 K/uL    Comment: Performed at [facility], [place] [address], [place], [place] [address]  Basic metabolic panel     Status: Abnormal   Collection Time: 04/02/22 12:09 PM  Result Value Ref Range   Sodium 140 135 - 145 mmol/L   Potassium 3.6 3.5 - 5.1 mmol/L   Chloride 108 98 - 111 mmol/L   CO2 27 22 - 32 mmol/L   Glucose, Bld 106 (H)  70 - 99 mg/dL    Comment: Glucose reference range applies only to samples taken after fasting for at least 8 hours.  BUN 14 8 - 23 mg/dL   Creatinine, Ser 0.88 0.44 - 1.00 mg/dL   Calcium 9.4 8.9 - 10.3 mg/dL   GFR, Estimated >60 >60 mL/min    Comment: (NOTE) Calculated using the CKD-EPI Creatinine Equation (2021)    Anion gap 5 5 - 15    Comment: Performed at [facility] [address], [place], [place] [address]  Brain natriuretic peptide     Status: None   Collection Time: 04/02/22 12:09 PM  Result Value Ref Range   B Natriuretic Peptide 20.5 0.0 - 100.0 pg/mL    Comment: Performed at [facility] [address], [place], [place] [address]  Urinalysis, Routine w reflex microscopic Urine, Clean Catch     Status: Abnormal   Collection Time: 04/02/22 10:00 PM  Result Value Ref Range   Color, Urine STRAW (A) YELLOW   APPearance CLEAR CLEAR   Specific Gravity, Urine 1.008 1.005 - 1.030   pH 8.0 5.0 - 8.0   Glucose, UA NEGATIVE NEGATIVE mg/dL   Hgb urine dipstick NEGATIVE NEGATIVE   Bilirubin Urine NEGATIVE NEGATIVE   Ketones, ur NEGATIVE NEGATIVE mg/dL   Protein, ur NEGATIVE NEGATIVE mg/dL   Nitrite POSITIVE (A) NEGATIVE   Leukocytes,Ua SMALL (A) NEGATIVE   RBC / HPF 0-5 0 - 5 RBC/hpf   WBC, UA 6-10 0 - 5 WBC/hpf   Bacteria, UA FEW (A) NONE SEEN    Comment: Performed at [facility], [address] [place], [place] [address]  Urine Culture     Status: Abnormal   Collection Time: 04/02/22 10:07 PM   Specimen: Urine, Clean Catch  Result Value Ref Range   Specimen Description URINE, CLEAN CATCH    Special Requests      NONE Performed at [facility], [place] [address], [place], [place] [address]    Culture 50,000 COLONIES/mL PSEUDOMONAS AERUGINOSA (A)    Report Status 04/04/2022 FINAL    Organism ID, Bacteria PSEUDOMONAS AERUGINOSA (A)       Susceptibility   Pseudomonas aeruginosa - MIC*    CEFTAZIDIME <=1 SENSITIVE Sensitive     CIPROFLOXACIN  <=0.25 SENSITIVE Sensitive     GENTAMICIN 4 SENSITIVE Sensitive     IMIPENEM 2 SENSITIVE Sensitive     PIP/TAZO <=4 SENSITIVE Sensitive     CEFEPIME 4 SENSITIVE Sensitive     * 50,000 COLONIES/mL PSEUDOMONAS AERUGINOSA  Urinalysis, Routine w reflex microscopic     Status: Abnormal   Collection Time: 04/11/22 12:28 PM  Result Value Ref Range   Color, Urine YELLOW Yellow;Lt. Yellow;Straw;Dark Yellow;Amber;Green;Red;Brown   APPearance Sl Cloudy (A) Clear;Turbid;Slightly Cloudy;Cloudy   Specific Gravity, Urine 1.010 1.000 - 1.030   pH 7.5 5.0 - 8.0   Total Protein, Urine NEGATIVE Negative   Urine Glucose NEGATIVE Negative   Ketones, ur NEGATIVE Negative   Bilirubin Urine NEGATIVE Negative   Hgb urine dipstick NEGATIVE Negative   Urobilinogen, UA 0.2 0.0 - 1.0   Leukocytes,Ua TRACE (A) Negative   Nitrite NEGATIVE Negative   WBC, UA 11-20/hpf (A) 0-2/hpf   RBC / HPF none seen 0-2/hpf   Squamous Epithelial / LPF Few(5-10/hpf) (A) Rare(0-4/hpf)   Amorphous Present (A) None;Present  Urine Culture     Status: Abnormal   Collection Time: 04/11/22 12:28 PM   Specimen: Urine  Result Value Ref Range   MICRO NUMBER: [id number]    SPECIMEN QUALITY: Adequate    Sample Source NOT GIVEN    STATUS: FINAL  ISOLATE 1: Pseudomonas aeruginosa (A)     Comment: 10,000-49,000 CFU/mL of Pseudomonas aeruginosa      Susceptibility   Pseudomonas aeruginosa - URINE CULTURE, REFLEX    CEFTAZIDIME 8 Sensitive     CEFEPIME 4 Sensitive     CIPROFLOXACIN <=0.25 Sensitive     LEVOFLOXACIN 0.5 Sensitive     GENTAMICIN 2 Sensitive     IMIPENEM 2 Sensitive     PIP/TAZO <=4 Sensitive     TOBRAMYCIN* <=1 Sensitive      * Legend: S = Susceptible  I = Intermediate R = Resistant  NS = Not susceptible * = Not tested  NR = Not reported **NN = See antimicrobic comments   CBC with Differential/Platelet     Status: Abnormal   Collection Time: 04/11/22 12:28 PM  Result Value Ref Range   WBC 6.9 4.0 - 10.5 K/uL    RBC 3.81 (L) 3.87 - 5.11 Mil/uL   Hemoglobin 11.2 (L) 12.0 - 15.0 g/dL   HCT 34.0 (L) 36.0 - 46.0 %   MCV 89.2 78.0 - 100.0 fl   MCHC 33.1 30.0 - 36.0 g/dL   RDW 16.0 (H) 11.5 - 15.5 %   Platelets 318.0 150.0 - 400.0 K/uL   Neutrophils Relative % 55.2 43.0 - 77.0 %   Lymphocytes Relative 32.4 12.0 - 46.0 %   Monocytes Relative 8.6 3.0 - 12.0 %   Eosinophils Relative 2.6 0.0 - 5.0 %   Basophils Relative 1.2 0.0 - 3.0 %   Neutro Abs 3.8 1.4 - 7.7 K/uL   Lymphs Abs 2.2 0.7 - 4.0 K/uL   Monocytes Absolute 0.6 0.1 - 1.0 K/uL   Eosinophils Absolute 0.2 0.0 - 0.7 K/uL   Basophils Absolute 0.1 0.0 - 0.1 K/uL  Basic Metabolic Panel (BMET)     Status: None   Collection Time: 04/11/22 12:28 PM  Result Value Ref Range   Sodium 137 135 - 145 mEq/L   Potassium 4.3 3.5 - 5.1 mEq/L   Chloride 100 96 - 112 mEq/L   CO2 29 19 - 32 mEq/L   Glucose, Bld 92 70 - 99 mg/dL   BUN 17 6 - 23 mg/dL   Creatinine, Ser 0.81 0.40 - 1.20 mg/dL   GFR 63.46 >60.00 mL/min    Comment: Calculated using the CKD-EPI Creatinine Equation (2021)   Calcium 10.2 8.4 - 10.5 mg/dL  Ferritin     Status: None   Collection Time: 04/11/22 12:28 PM  Result Value Ref Range   Ferritin 70.5 10.0 - 291.0 ng/mL   Objective  Body mass index is 25.97 kg/m. Wt Readings from Last 3 Encounters:  04/18/22 133 lb (60.3 kg)  04/11/22 133 lb (60.3 kg)  03/29/22 133 lb (60.3 kg)   Temp Readings from Last 3 Encounters:  04/18/22 98.1 F (36.7 C) (Oral)  04/11/22 97.6 F (36.4 C) (Temporal)  04/03/22 98.3 F (36.8 C) (Oral)   BP Readings from Last 3 Encounters:  04/18/22 110/60  04/11/22 112/60  04/03/22 118/67   Pulse Readings from Last 3 Encounters:  04/18/22 71  04/11/22 66  04/03/22 69    Physical Exam Vitals and nursing note reviewed.  Constitutional:      Appearance: Normal appearance. She is well-developed and well-groomed.  HENT:     Head: Normocephalic and atraumatic.  Eyes:     Conjunctiva/sclera:  Conjunctivae normal.     Pupils: Pupils are equal, round, and reactive to light.  Cardiovascular:     Rate and  Rhythm: Normal rate and regular rhythm.     Heart sounds: Normal heart sounds. No murmur heard. Pulmonary:     Effort: Pulmonary effort is normal.     Breath sounds: Normal breath sounds.  Abdominal:     General: Abdomen is flat. Bowel sounds are normal.     Tenderness: There is no abdominal tenderness.  Musculoskeletal:        General: No tenderness.  Skin:    General: Skin is warm and dry.  Neurological:     General: No focal deficit present.     Mental Status: She is alert and oriented to person, place, and time. Mental status is at baseline.     Cranial Nerves: Cranial nerves 2-12 are intact.     Gait: Gait abnormal.     Comments: In wheelchair today  Psychiatric:        Attention and Perception: Attention and perception normal.        Mood and Affect: Mood and affect normal.        Speech: Speech normal.        Behavior: Behavior normal. Behavior is cooperative.        Thought Content: Thought content normal.        Cognition and Memory: Cognition and memory normal.        Judgment: Judgment normal.     Assessment  Plan  Recurrent UTI - Plan: Ambulatory referral to Urology Cipro 500 mg bid per daughter requests wants Abx instead on waiting on culture results due to weekend upcoming   AZO for UTI with pyridium (may turn pee orange) can buy over the counter  Tru you for UTI vitamin shoppe has cranberry with D Mannose helps to prevent UTI  Acute cystitis without hematuria - Plan: Urinalysis, Routine w reflex microscopic, Urine Culture, Urine Culture, Urinalysis, Routine w reflex microscopic, Ambulatory referral to Urology  Dysuria - Plan: Ambulatory referral to Urology  Closed nondisplaced fracture of pelvis, unspecified part of pelvis, initial encounter [facility])  Provider: Dr. Olivia Mackie McLean-Scocuz[facility]-Internal Medicine

## 2022-04-18 ENCOUNTER — Other Ambulatory Visit: Payer: Self-pay

## 2022-04-18 ENCOUNTER — Telehealth: Payer: Self-pay | Admitting: Internal Medicine

## 2022-04-18 ENCOUNTER — Ambulatory Visit (INDEPENDENT_AMBULATORY_CARE_PROVIDER_SITE_OTHER): Payer: Medicare Other | Admitting: Internal Medicine

## 2022-04-18 ENCOUNTER — Encounter: Payer: Self-pay | Admitting: Internal Medicine

## 2022-04-18 VITALS — BP 110/60 | HR 71 | Temp 98.1°F | Resp 14 | Ht 60.0 in | Wt 133.0 lb

## 2022-04-18 DIAGNOSIS — S329XXA Fracture of unspecified parts of lumbosacral spine and pelvis, initial encounter for closed fracture: Secondary | ICD-10-CM

## 2022-04-18 DIAGNOSIS — N3000 Acute cystitis without hematuria: Secondary | ICD-10-CM | POA: Diagnosis not present

## 2022-04-18 DIAGNOSIS — R413 Other amnesia: Secondary | ICD-10-CM

## 2022-04-18 DIAGNOSIS — R300 Dysuria: Secondary | ICD-10-CM | POA: Diagnosis not present

## 2022-04-18 DIAGNOSIS — N390 Urinary tract infection, site not specified: Secondary | ICD-10-CM | POA: Diagnosis not present

## 2022-04-18 MED ORDER — CIPROFLOXACIN HCL 500 MG PO TABS: 500.0000 mg | ORAL_TABLET | Freq: Two times a day (BID) | ORAL | 0 refills | Status: AC

## 2022-04-18 NOTE — Patient Instructions (Addendum)
AZO for UTI with pyridium (may turn pee orange) can buy over the counter  Tru you for UTI vitamin shoppe has cranberry with D Mannose helps to prevent UTI  The [employer] 4.4 [employer] reviews $Vitamin & supplements store Chain with a wide range of nutritional supplements, vitamins & herbs, plus bath & beauty products. Service options: In-store shopping  Curbside pickup Located in: [place] Address: [address], [place], [place] [address] Hours:  Open ? Closes 8:30?PM Order: [email or web address]   Providers  Phone: [phone]   Nickolas Madrid, MD or Dr. Bernardo Heater  5.0 (1)   Urologist in [place], [facility]  Website Directions Call Overview Reviews Get online care: [email or web address] Address: [address], [place], [place] [address] Phone: [phone]

## 2022-04-18 NOTE — Telephone Encounter (Signed)
faxed

## 2022-04-18 NOTE — Telephone Encounter (Signed)
Kelsey from [facility] called stating needing OT order to be faxed back to [phone] before can be seen. Please advise and Thank you!

## 2022-04-18 NOTE — Telephone Encounter (Signed)
[place] for OT order

## 2022-04-18 NOTE — Telephone Encounter (Signed)
I dont need a referral order for OT sent to me. :) The order for OT will be faxed in from [facility] and was previously faxed from [facility] when it comes in please sign and fax back. Thank you!

## 2022-04-19 LAB — URINE CULTURE
MICRO NUMBER:: 13530398
SPECIMEN QUALITY:: ADEQUATE

## 2022-04-19 LAB — URINALYSIS, ROUTINE W REFLEX MICROSCOPIC
Bilirubin Urine: NEGATIVE
Glucose, UA: NEGATIVE
Hgb urine dipstick: NEGATIVE
Ketones, ur: NEGATIVE
Leukocytes,Ua: NEGATIVE
Nitrite: NEGATIVE
Protein, ur: NEGATIVE
Specific Gravity, Urine: 1.025 (ref 1.001–1.035)
pH: <=5 (ref 5.0–8.0)

## 2022-04-19 NOTE — Telephone Encounter (Signed)
See attached.  Should be receiving a fax for order for OT.  I may have already signed. Please see if have form.

## 2022-04-22 DIAGNOSIS — M1712 Unilateral primary osteoarthritis, left knee: Secondary | ICD-10-CM | POA: Diagnosis not present

## 2022-04-22 DIAGNOSIS — S32591A Other specified fracture of right pubis, initial encounter for closed fracture: Secondary | ICD-10-CM | POA: Diagnosis not present

## 2022-04-25 ENCOUNTER — Telehealth: Payer: Self-pay | Admitting: Internal Medicine

## 2022-04-25 NOTE — Telephone Encounter (Signed)
S/w Sabrina Cervantes - pt still urinating every 20 min at night, complaining of pain. Saw Dr. Olivia Mackie 6/15, ucx was contaminated, u/a was normal.

## 2022-04-25 NOTE — Telephone Encounter (Signed)
With symptoms, last culture was negative for infection.  Given persistence - refer to urology.

## 2022-04-25 NOTE — Telephone Encounter (Signed)
Patient has an appointment on 05/01/22 with [facility], daughter says she believes that its just she going so frequent and that its exhausting for her going so frequently, daughter  says she has been giving her cystex and that has not helped she just wanted to know of something that might could help.

## 2022-04-25 NOTE — Telephone Encounter (Signed)
Pt daughter called stating she would like to talk to the cma about pt urination

## 2022-04-26 MED ORDER — TROSPIUM CHLORIDE 20 MG PO TABS: 20.0000 mg | ORAL_TABLET | Freq: Every day | ORAL | 0 refills | Status: DC

## 2022-04-26 NOTE — Telephone Encounter (Addendum)
Patient daughter DPR is agreeable to try the trospium at bedtime. I have pended order for your approval. Correct pharmacy is set.

## 2022-04-26 NOTE — Telephone Encounter (Signed)
Patient's daughter, Darrick Huntsman, called to follow-up on prescription for trospium.  Bonita Quin states she would like to start patient on medication tonight.  *Bonita Quin states patient's preferred pharmacy is [facility] [address] in [place].

## 2022-04-29 DIAGNOSIS — D649 Anemia, unspecified: Secondary | ICD-10-CM | POA: Diagnosis not present

## 2022-04-29 DIAGNOSIS — S32591D Other specified fracture of right pubis, subsequent encounter for fracture with routine healing: Secondary | ICD-10-CM | POA: Diagnosis not present

## 2022-04-29 DIAGNOSIS — F0390 Unspecified dementia without behavioral disturbance: Secondary | ICD-10-CM | POA: Diagnosis not present

## 2022-04-29 DIAGNOSIS — S32511D Fracture of superior rim of right pubis, subsequent encounter for fracture with routine healing: Secondary | ICD-10-CM | POA: Diagnosis not present

## 2022-04-29 DIAGNOSIS — F419 Anxiety disorder, unspecified: Secondary | ICD-10-CM | POA: Diagnosis not present

## 2022-04-29 DIAGNOSIS — M1712 Unilateral primary osteoarthritis, left knee: Secondary | ICD-10-CM | POA: Diagnosis not present

## 2022-04-29 DIAGNOSIS — N390 Urinary tract infection, site not specified: Secondary | ICD-10-CM | POA: Diagnosis not present

## 2022-05-01 ENCOUNTER — Ambulatory Visit (INDEPENDENT_AMBULATORY_CARE_PROVIDER_SITE_OTHER): Payer: Medicare Other | Admitting: Urology

## 2022-05-01 ENCOUNTER — Encounter: Payer: Self-pay | Admitting: Urology

## 2022-05-01 ENCOUNTER — Encounter: Payer: Self-pay | Admitting: Oncology

## 2022-05-01 VITALS — BP 144/83 | HR 91 | Ht 60.0 in | Wt 133.0 lb

## 2022-05-01 DIAGNOSIS — Z87440 Personal history of urinary (tract) infections: Secondary | ICD-10-CM

## 2022-05-01 DIAGNOSIS — N3000 Acute cystitis without hematuria: Secondary | ICD-10-CM | POA: Diagnosis not present

## 2022-05-01 DIAGNOSIS — N3281 Overactive bladder: Secondary | ICD-10-CM

## 2022-05-01 DIAGNOSIS — N390 Urinary tract infection, site not specified: Secondary | ICD-10-CM

## 2022-05-01 DIAGNOSIS — R351 Nocturia: Secondary | ICD-10-CM | POA: Diagnosis not present

## 2022-05-01 LAB — URINALYSIS, COMPLETE
Bilirubin, UA: NEGATIVE
Glucose, UA: NEGATIVE
Ketones, UA: NEGATIVE
Leukocytes,UA: NEGATIVE
Nitrite, UA: NEGATIVE
Specific Gravity, UA: 1.02 (ref 1.005–1.030)
Urobilinogen, Ur: 0.2 mg/dL (ref 0.2–1.0)
pH, UA: 7.5 (ref 5.0–7.5)

## 2022-05-01 LAB — MICROSCOPIC EXAMINATION: Bacteria, UA: NONE SEEN

## 2022-05-01 MED ORDER — GEMTESA 75 MG PO TABS: 75.0000 mg | ORAL_TABLET | Freq: Every day | ORAL | 0 refills | Status: DC

## 2022-05-01 NOTE — Progress Notes (Unsigned)
05/01/2022 2:42 PM   Alden Benjamin Roma Kayser 1928-11-26 [id number]  Referring provider: McLean-Scocuzza, Nino Glow, MD [place],  [place] [address]  Chief Complaint  Patient presents with   Cystitis    HPI: ALAYZHA AN is a 88 y.o. female with history dementia referred for evaluation of recurrent UTI and lower urinary tract symptoms.  She presents with her  daughter [name]) who provided the history.  History recurrent UTIs and overactive bladder symptoms including frequency, urgency, nocturia hourly where she voids only small amounts from ~ 0030-0430 Was having increased symptoms last week and Dr. Nicki Reaper messaged me regarding possible medications to try.  Trospium 20 mg at bedtime was recommended which the daughter states has helped slightly No gross hematuria Multiple positive cultures including E. coli with her last cultures growing Klebsiella She has been on cranberry tablets but recently stopped several of her supplements due to some nausea Recently found to have a nondisplaced pelvic fracture which has decreased her mobility Prior history stone disease She sleeps more during the day and awake more at night which may account for some of her nocturia   PMH: Past Medical History:  Diagnosis Date   Anemia    Anxiety    Arthritis    "left hip" (08/15/2016)   Basal cell carcinoma    forehead & nose; [employer] cut them off; deep cutting   Depression    GERD (gastroesophageal reflux disease)    Hepatitis    "think I had it as a child"   History of kidney stones    HOH (hard of hearing)    Hypercholesterolemia    Osteoporosis    Pyelonephritis, acute     s/p ureteral stent   Vasomotor rhinitis    chronic    Surgical History: Past Surgical History:  Procedure Laterality Date   ABDOMINAL HYSTERECTOMY  1968   secondary to prolapse, ovaries not removed   BASAL CELL CARCINOMA EXCISION     forehead & nose; [place] cut them off; deep cutting   CATARACT  EXTRACTION, BILATERAL Bilateral    COLONOSCOPY     DILATION AND CURETTAGE OF UTERUS     S/P miscarriage   LITHOTRIPSY     Dr Yves Dill, nephrolithiasis   PARATHYROIDECTOMY Left 08/15/2016    inferior minimally invasive /notes 08/15/2016   PARATHYROIDECTOMY Left 08/15/2016   Procedure: LEFT INFERIOR PARATHYROIDECTOMY;  Surgeon: Armandina Gemma, MD;  Location: [facility];  Service: General;  Laterality: Left;   RETINAL DETACHMENT SURGERY     URETERAL STENT PLACEMENT      Home Medications:  Allergies as of 05/01/2022       Reactions   Pyridium [phenazopyridine Hcl] Itching, Rash        Medication List        Accurate as of May 01, 2022  2:42 PM. If you have any questions, ask your nurse or doctor.          STOP taking these medications    cefdinir 300 MG capsule Commonly known as: OMNICEF Stopped by: Abbie Sons, MD       TAKE these medications    acetaminophen 500 MG tablet Commonly known as: TYLENOL Take 1,000 mg by mouth 3 (three) times daily.   CALCIUM CARBONATE-VITAMIN D PO Take 1 tablet by mouth 2 (two) times daily.   CRANBERRY PO Take 1 tablet by mouth daily.   cyanocobalamin 1000 MCG/ML injection Commonly known as: (VITAMIN B-12) Inject 1,000 mcg into the muscle every 30 (  thirty) days.   feeding supplement Liqd Take 237 mLs by mouth 2 (two) times daily between meals.   Fish Oil 1200 MG Caps Take 1,200 mg by mouth daily.   ipratropium 0.03 % nasal spray Commonly known as: ATROVENT Place 1 spray into both nostrils every 12 (twelve) hours.   multivitamins with iron Tabs tablet Take 1 tablet by mouth daily.   PROBIOTIC PO Take 1 tablet by mouth daily.   simvastatin 40 MG tablet Commonly known as: ZOCOR Take 1 tablet (40 mg total) by mouth every evening.   SOOTHE OP Place 1 drop into both eyes daily as needed (dry eyes).   traMADol 50 MG tablet Commonly known as: Ultram 1/2 tablet q hs prn and one tablet q day prn with physical therapy.    trospium 20 MG tablet Commonly known as: SANCTURA Take 1 tablet (20 mg total) by mouth at bedtime.   Vitamin D-3 25 MCG (1000 UT) Caps Take 1,000 Units by mouth daily.        Allergies:  Allergies  Allergen Reactions   Pyridium [Phenazopyridine Hcl] Itching and Rash    Family History: Family History  Problem Relation Age of Onset   Skin cancer Father    Prostate cancer Father    Colon cancer Brother    Prostate cancer Brother    Hypertension Sister    Skin cancer Sister    Diabetes Mellitus II Sister    Breast cancer Sister 48   Breast cancer Other        niece    Social History:  reports that she has never smoked. She has never used smokeless tobacco. She reports that she does not drink alcohol and does not use drugs.   Physical Exam: BP (!) 144/83   Pulse 91   Ht 5' (1.524 m)   Wt 133 lb (60.3 kg)   BMI 25.97 kg/m   Constitutional: Somnolent, no acute distress. HEENT: [place] AT Cardiovascular: No clubbing, cyanosis, or edema. Respiratory: Normal respiratory effort, no increased work of breathing.   Laboratory Data:  Urinalysis Dipstick/microscopy negative    Assessment & Plan:    1.  Overactive bladder  May be exacerbated by dementia We discussed possibility of nocturnal polyuria however her nighttime voided volumes are small and she does have changes in her sleep cycle DC trospium Trial Gemtesa 75 mg daily-samples given Follow-up 1 month for symptom recheck  2.  Recurrent UTI UA today clear We discussed the evaluation and treatment of patients with recurrent UTIs at length.  Possible etiologies of recurrent infection include periurethral tissue atrophy in postmenopausal woman, constipation, sexual activity, incomplete emptying, anatomic abnormalities, and even genetic predisposition.  Finally, we discussed the role of perineal hygiene, timed voiding, adequate hydration, cranberry prophylaxis, and low-dose antibiotic prophylaxis. History of stone  disease and recent infections having Klebsiella Schedule screening RUS ***order   Abbie Sons, MD  [facility] [address] [place], [place] [address] [phone]

## 2022-05-02 ENCOUNTER — Other Ambulatory Visit: Payer: Self-pay | Admitting: Family Medicine

## 2022-05-03 ENCOUNTER — Emergency Department (HOSPITAL_BASED_OUTPATIENT_CLINIC_OR_DEPARTMENT_OTHER): Payer: Medicare Other | Admitting: Radiology

## 2022-05-03 ENCOUNTER — Encounter (HOSPITAL_BASED_OUTPATIENT_CLINIC_OR_DEPARTMENT_OTHER): Payer: Self-pay | Admitting: Emergency Medicine

## 2022-05-03 ENCOUNTER — Emergency Department (HOSPITAL_BASED_OUTPATIENT_CLINIC_OR_DEPARTMENT_OTHER): Payer: Medicare Other

## 2022-05-03 ENCOUNTER — Other Ambulatory Visit: Payer: Self-pay

## 2022-05-03 ENCOUNTER — Observation Stay (HOSPITAL_BASED_OUTPATIENT_CLINIC_OR_DEPARTMENT_OTHER)
Admission: EM | Admit: 2022-05-03 | Discharge: 2022-05-04 | Disposition: A | Discharge status: Home / Self Care | Payer: Medicare Other | Attending: Student | Admitting: Student

## 2022-05-03 DIAGNOSIS — R1319 Other dysphagia: Secondary | ICD-10-CM | POA: Insufficient documentation

## 2022-05-03 DIAGNOSIS — F03C0 Unspecified dementia, severe, without behavioral disturbance, psychotic disturbance, mood disturbance, and anxiety: Secondary | ICD-10-CM | POA: Insufficient documentation

## 2022-05-03 DIAGNOSIS — D649 Anemia, unspecified: Secondary | ICD-10-CM | POA: Diagnosis not present

## 2022-05-03 DIAGNOSIS — Z960 Presence of urogenital implants: Secondary | ICD-10-CM | POA: Insufficient documentation

## 2022-05-03 DIAGNOSIS — R079 Chest pain, unspecified: Secondary | ICD-10-CM | POA: Diagnosis not present

## 2022-05-03 DIAGNOSIS — Z7189 Other specified counseling: Secondary | ICD-10-CM | POA: Insufficient documentation

## 2022-05-03 DIAGNOSIS — Y844 Aspiration of fluid as the cause of abnormal reaction of the patient, or of later complication, without mention of misadventure at the time of the procedure: Secondary | ICD-10-CM | POA: Insufficient documentation

## 2022-05-03 DIAGNOSIS — R7989 Other specified abnormal findings of blood chemistry: Secondary | ICD-10-CM

## 2022-05-03 DIAGNOSIS — R1013 Epigastric pain: Secondary | ICD-10-CM | POA: Diagnosis not present

## 2022-05-03 DIAGNOSIS — T17900A Unspecified foreign body in respiratory tract, part unspecified causing asphyxiation, initial encounter: Secondary | ICD-10-CM | POA: Insufficient documentation

## 2022-05-03 DIAGNOSIS — K219 Gastro-esophageal reflux disease without esophagitis: Secondary | ICD-10-CM | POA: Diagnosis present

## 2022-05-03 DIAGNOSIS — Z79899 Other long term (current) drug therapy: Secondary | ICD-10-CM | POA: Insufficient documentation

## 2022-05-03 DIAGNOSIS — Z85828 Personal history of other malignant neoplasm of skin: Secondary | ICD-10-CM | POA: Insufficient documentation

## 2022-05-03 DIAGNOSIS — E876 Hypokalemia: Secondary | ICD-10-CM | POA: Diagnosis not present

## 2022-05-03 DIAGNOSIS — D72825 Bandemia: Secondary | ICD-10-CM | POA: Diagnosis not present

## 2022-05-03 DIAGNOSIS — K2100 Gastro-esophageal reflux disease with esophagitis, without bleeding: Secondary | ICD-10-CM | POA: Diagnosis not present

## 2022-05-03 DIAGNOSIS — N281 Cyst of kidney, acquired: Secondary | ICD-10-CM | POA: Diagnosis not present

## 2022-05-03 DIAGNOSIS — J9811 Atelectasis: Secondary | ICD-10-CM | POA: Diagnosis not present

## 2022-05-03 DIAGNOSIS — R112 Nausea with vomiting, unspecified: Secondary | ICD-10-CM | POA: Insufficient documentation

## 2022-05-03 DIAGNOSIS — T17908A Unspecified foreign body in respiratory tract, part unspecified causing other injury, initial encounter: Secondary | ICD-10-CM

## 2022-05-03 DIAGNOSIS — K8020 Calculus of gallbladder without cholecystitis without obstruction: Secondary | ICD-10-CM | POA: Diagnosis not present

## 2022-05-03 DIAGNOSIS — F0390 Unspecified dementia without behavioral disturbance: Secondary | ICD-10-CM | POA: Diagnosis present

## 2022-05-03 DIAGNOSIS — K209 Esophagitis, unspecified without bleeding: Secondary | ICD-10-CM | POA: Diagnosis not present

## 2022-05-03 DIAGNOSIS — R1110 Vomiting, unspecified: Secondary | ICD-10-CM | POA: Diagnosis not present

## 2022-05-03 LAB — CBC WITH DIFFERENTIAL/PLATELET
Abs Immature Granulocytes: 0.06 10*3/uL (ref 0.00–0.07)
Basophils Absolute: 0.1 10*3/uL (ref 0.0–0.1)
Basophils Relative: 1 %
Eosinophils Absolute: 0.1 10*3/uL (ref 0.0–0.5)
Eosinophils Relative: 1 %
HCT: 36.1 % (ref 36.0–46.0)
Hemoglobin: 11.7 g/dL — ABNORMAL LOW (ref 12.0–15.0)
Immature Granulocytes: 1 %
Lymphocytes Relative: 17 %
Lymphs Abs: 1.9 10*3/uL (ref 0.7–4.0)
MCH: 29 pg (ref 26.0–34.0)
MCHC: 32.4 g/dL (ref 30.0–36.0)
MCV: 89.6 fL (ref 80.0–100.0)
Monocytes Absolute: 1 10*3/uL (ref 0.1–1.0)
Monocytes Relative: 9 %
Neutro Abs: 8 10*3/uL — ABNORMAL HIGH (ref 1.7–7.7)
Neutrophils Relative %: 71 %
Platelets: 237 10*3/uL (ref 150–400)
RBC: 4.03 MIL/uL (ref 3.87–5.11)
RDW: 14.7 % (ref 11.5–15.5)
WBC: 11 10*3/uL — ABNORMAL HIGH (ref 4.0–10.5)
nRBC: 0 % (ref 0.0–0.2)

## 2022-05-03 LAB — COMPREHENSIVE METABOLIC PANEL
ALT: 7 U/L (ref 0–44)
AST: 12 U/L — ABNORMAL LOW (ref 15–41)
Albumin: 4.3 g/dL (ref 3.5–5.0)
Alkaline Phosphatase: 69 U/L (ref 38–126)
Anion gap: 11 (ref 5–15)
BUN: 23 mg/dL (ref 8–23)
CO2: 30 mmol/L (ref 22–32)
Calcium: 12 mg/dL — ABNORMAL HIGH (ref 8.9–10.3)
Chloride: 100 mmol/L (ref 98–111)
Creatinine, Ser: 0.85 mg/dL (ref 0.44–1.00)
GFR, Estimated: >60 mL/min (ref >60)
Glucose, Bld: 117 mg/dL — ABNORMAL HIGH (ref 70–99)
Potassium: 3.3 mmol/L — ABNORMAL LOW (ref 3.5–5.1)
Sodium: 141 mmol/L (ref 135–145)
Total Bilirubin: 0.9 mg/dL (ref 0.3–1.2)
Total Protein: 6.7 g/dL (ref 6.5–8.1)

## 2022-05-03 LAB — LIPASE, BLOOD: Lipase: 23 U/L (ref 11–51)

## 2022-05-03 LAB — TROPONIN I (HIGH SENSITIVITY)
Troponin I (High Sensitivity): 6 ng/L (ref <18)
Troponin I (High Sensitivity): 5 ng/L (ref <18)

## 2022-05-03 LAB — D-DIMER, QUANTITATIVE: D-Dimer, Quant: 2.09 ug/mL-FEU — ABNORMAL HIGH (ref 0.00–0.50)

## 2022-05-03 MED ORDER — ACETAMINOPHEN 325 MG PO TABS
650.0000 mg | ORAL_TABLET | Freq: Four times a day (QID) | ORAL | Status: DC | PRN
  Administered 2022-05-03 – 2022-05-04 (×2): 650 mg via ORAL
  Filled 2022-05-03 (×2): qty 2

## 2022-05-03 MED ORDER — IOHEXOL 350 MG/ML SOLN
100.0000 mL | Freq: Once | INTRAVENOUS | Status: AC | PRN
  Administered 2022-05-03: 75 mL via INTRAVENOUS

## 2022-05-03 MED ORDER — POTASSIUM CHLORIDE 2 MEQ/ML IV SOLN
INTRAVENOUS | Status: DC
  Filled 2022-05-03 (×3): qty 1000

## 2022-05-03 MED ORDER — PANTOPRAZOLE SODIUM 40 MG IV SOLR
40.0000 mg | Freq: Two times a day (BID) | INTRAVENOUS | Status: DC
  Administered 2022-05-03 – 2022-05-04 (×2): 40 mg via INTRAVENOUS
  Filled 2022-05-03 (×2): qty 10

## 2022-05-03 MED ORDER — POLYETHYLENE GLYCOL 3350 17 G PO PACK: 17.0000 g | PACK | Freq: Every day | ORAL | Status: DC | PRN

## 2022-05-03 MED ORDER — SODIUM CHLORIDE 0.9 % IV BOLUS
500.0000 mL | Freq: Once | INTRAVENOUS | Status: AC
  Administered 2022-05-03: 500 mL via INTRAVENOUS

## 2022-05-03 MED ORDER — ENOXAPARIN SODIUM 40 MG/0.4ML IJ SOSY
40.0000 mg | PREFILLED_SYRINGE | INTRAMUSCULAR | Status: DC
  Administered 2022-05-03: 40 mg via SUBCUTANEOUS
  Filled 2022-05-03: qty 0.4

## 2022-05-03 MED ORDER — MELATONIN 5 MG PO TABS: 5.0000 mg | ORAL_TABLET | Freq: Every evening | ORAL | Status: DC | PRN

## 2022-05-03 MED ORDER — OXYCODONE HCL 5 MG PO TABS
5.0000 mg | ORAL_TABLET | Freq: Four times a day (QID) | ORAL | Status: DC | PRN
  Administered 2022-05-04: 5 mg via ORAL
  Filled 2022-05-03: qty 1

## 2022-05-03 NOTE — ED Provider Notes (Signed)
[facility] EMERGENCY DEPT Provider Note   CSN: [id number] Arrival date & time: 05/03/22  0847     History  Chief Complaint  Patient presents with   Chest Pain    Sabrina Cervantes is a 88 y.o. female.   Chest Pain Left-sided chest pain.  Patient presents reported began last night.  History of dementia so somewhat difficult to get history.  History comes from patient's daughter.  Complaining of pain.  Has recently had pelvic fractures.  Also has had some urinary symptoms and saw urology yesterday.  Yesterday began nausea and vomiting.  Did not sleep well last night due to the pain.  No swelling in her legs.  Pain worse with movements coughing and palpation.    Home Medications Prior to Admission medications   Medication Sig Start Date End Date Taking? Authorizing Provider  acetaminophen (TYLENOL) 500 MG tablet Take 1,000 mg by mouth 3 (three) times daily.    [provider]  CALCIUM CARBONATE-VITAMIN D PO Take 1 tablet by mouth 2 (two) times daily.    [provider]  Cholecalciferol (VITAMIN D-3) 1000 UNITS CAPS Take 1,000 Units by mouth daily.     [provider]  CRANBERRY PO Take 1 tablet by mouth daily.    [provider]  cyanocobalamin (,VITAMIN B-12,) 1000 MCG/ML injection Inject 1,000 mcg into the muscle every 30 (thirty) days.    [provider]  feeding supplement (ENSURE ENLIVE / ENSURE PLUS) LIQD Take 237 mLs by mouth 2 (two) times daily between meals. 04/03/22 05/03/22  Antonieta Pert, MD  ipratropium (ATROVENT) 0.03 % nasal spray Place 1 spray into both nostrils every 12 (twelve) hours. 01/27/20   Einar Pheasant, MD  Multiple Vitamins-Iron (MULTIVITAMINS WITH IRON) TABS tablet Take 1 tablet by mouth daily.    [provider]  Omega-3 Fatty Acids (FISH OIL) 1200 MG CAPS Take 1,200 mg by mouth daily.    [provider]  Probiotic Product (PROBIOTIC PO) Take 1 tablet by mouth daily.    [provider]  Propylene Glycol-Glycerin (SOOTHE OP) Place 1 drop into both eyes daily as needed (dry eyes).    [provider]  simvastatin (ZOCOR) 40 MG tablet Take 1 tablet (40 mg total) by mouth every evening. 07/30/21   Einar Pheasant, MD  traMADol (ULTRAM) 50 MG tablet 1/2 tablet q hs prn and one tablet q day prn with physical therapy. 04/17/22   Einar Pheasant, MD  trospium (SANCTURA) 20 MG tablet Take 1 tablet (20 mg total) by mouth at bedtime. 04/26/22   Einar Pheasant, MD  Vibegron (GEMTESA) 75 MG TABS Take 75 mg by mouth daily. 05/01/22   Stoioff, Ronda Fairly, MD      Allergies    Pyridium [phenazopyridine hcl]    Review of Systems   Review of Systems  Cardiovascular:  Positive for chest pain.    Physical Exam Updated Vital Signs BP 135/69 (BP Location: Right Arm)   Pulse 82   Temp 97.9 F (36.6 C) (Oral)   Resp 15   Ht 5' (1.524 m)   Wt 60.3 kg   SpO2 95%   BMI 25.97 kg/m  Physical Exam Vitals and nursing note reviewed.  Cardiovascular:     Rate and Rhythm: Normal rate and regular rhythm.  Pulmonary:     Breath sounds: No wheezing, rhonchi or rales.  Chest:     Chest wall: Tenderness present.     Comments: Tenderness to left anterior chest wall.  No rash. Abdominal:     Tenderness: There is abdominal tenderness.     Comments: Left upper quadrant tenderness out rebound or guarding.  No hernias palpated.  Musculoskeletal:     Cervical back: Neck supple.  Skin:    General: Skin is warm.  Neurological:     Mental Status: She is alert.     Comments: Patient is at her neurologic baseline per her daughter.     ED Results / Procedures / Treatments   Labs (all labs ordered are listed, but only abnormal results are displayed) Labs Reviewed  CBC WITH DIFFERENTIAL/PLATELET - Abnormal; Notable for the following components:      Result Value   WBC 11.0 (*)    Hemoglobin 11.7 (*)    Neutro Abs 8.0 (*)    All other components within normal limits  D-DIMER,  QUANTITATIVE - Abnormal; Notable for the following components:   D-Dimer, Quant 2.09 (*)    All other components within normal limits  COMPREHENSIVE METABOLIC PANEL - Abnormal; Notable for the following components:   Potassium 3.3 (*)    Glucose, Bld 117 (*)    Calcium 12.0 (*)    AST 12 (*)    All other components within normal limits  LIPASE, BLOOD  URINALYSIS, ROUTINE W REFLEX MICROSCOPIC  TROPONIN I (HIGH SENSITIVITY)  TROPONIN I (HIGH SENSITIVITY)    EKG EKG Interpretation  Date/Time:  Friday May 03 2022 09:01:24 EDT Ventricular Rate:  90 PR Interval:  168 QRS Duration: 90 QT Interval:  362 QTC Calculation: 443 R Axis:   -17 Text Interpretation: Sinus rhythm Abnormal R-wave progression, early transition Inferior infarct, age indeterminate No significant change since last tracing Confirmed by Davonna Belling [phone]) on 05/03/2022 9:04:20 AM  Radiology CT Angio Chest PE W and/or Wo Contrast  Result Date: 05/03/2022 CLINICAL DATA:  A 88 year old female presents for evaluation of a 88 year old female presents for evaluation of chest pain and vomiting. EXAM: CT ANGIOGRAPHY CHEST WITH CONTRAST TECHNIQUE: Multidetector CT imaging of the chest was performed using the standard protocol during bolus administration of intravenous contrast. Multiplanar CT image reconstructions and MIPs were obtained to evaluate the vascular anatomy. RADIATION DOSE REDUCTION: This exam was performed according to the departmental dose-optimization program which includes automated exposure control, adjustment of the mA and/or kV according to patient size and/or use of iterative reconstruction technique. CONTRAST:  58m OMNIPAQUE IOHEXOL 350 MG/ML SOLN COMPARISON:  CT of the abdomen and pelvis of May 03, 2022. FINDINGS: Cardiovascular: Calcified and noncalcified aortic atherosclerotic plaque. No gross acute aortic process. Three-vessel branching pattern in the chest. Limited assessment due to bolus timing on  this study which is optimized for evaluation of pulmonary vascular bed. Ascending thoracic aorta is nondilated. Central pulmonary vasculature is of normal caliber with density of 418 Hounsfield units, study quality is good. The study is negative for pulmonary embolism. Mediastinum/Nodes: Marked thickening of the esophagus with irregular thickening of the mid esophagus and mural stratification of the distal esophagus. Small hiatal hernia. Material within the esophagus. Infiltration of the fat about the esophagus. No signs of gas in the mediastinum. Small LEFT paratracheal lymph node (image 53/4) 7 mm. This is at the area of greatest esophageal irregularity and thickening. No subcarinal adenopathy. No hilar adenopathy. No thoracic inlet lymphadenopathy. No axillary lymphadenopathy. Lungs/Pleura: Areas of ground-glass. Bronchial wall thickening. Basilar airspace disease and small bilateral pleural effusions. Material in LEFT lower lobe bronchi. Upper Abdomen: Small hiatal hernia as discussed. Large RIGHT renal cyst for  which no additional dedicated imaging follow-up is recommended, partially evaluated better seen on the patient's abdominal CT images, see report for this for further detail. The upper abdomen shows no acute findings to the extent evaluated. Musculoskeletal: Osteopenia.  Spinal degenerative changes. 40-50% loss of height of the C7 level is of uncertain chronicity. Wedging of anterior superior endplates of O27-X41 associated with Schmorl's nodes, favored to be chronic. Mild loss of height also of the T2 vertebral body, of uncertain chronicity. Review of the MIP images confirms the above findings. IMPRESSION: 1. Negative for pulmonary embolism. 2. Irregular marked thickening of the esophagus extending from mid through distal esophagus with nodular appearance particularly in the mid esophagus raising the question of esophageal neoplasm versus severe esophagitis. 3. Pulmonary parenchymal ground-glass, filling  defects in LEFT lower lobe bronchi and bronchial wall thickening raising the question of aspiration. 4. Small hiatal hernia. 5. Small bilateral pleural effusions associated with above findings without signs of pleural enhancement and with bibasilar airspace disease. 6. Aortic atherosclerosis. Aortic Atherosclerosis (ICD10-I70.0). Electronically Signed   By: Zetta Bills M.D.   On: 05/03/2022 13:25   CT ABDOMEN PELVIS W CONTRAST  Result Date: 05/03/2022 CLINICAL DATA:  Left upper quadrant abdominal pain EXAM: CT ABDOMEN AND PELVIS WITH CONTRAST TECHNIQUE: Multidetector CT imaging of the abdomen and pelvis was performed using the standard protocol following bolus administration of intravenous contrast. RADIATION DOSE REDUCTION: This exam was performed according to the departmental dose-optimization program which includes automated exposure control, adjustment of the mA and/or kV according to patient size and/or use of iterative reconstruction technique. CONTRAST:  24m OMNIPAQUE IOHEXOL 350 MG/ML SOLN COMPARISON:  CT scan of the pelvis 04/02/2022 FINDINGS: Lower chest: See concurrently obtained but separately dictated CT scan of the chest. Hepatobiliary: Normal hepatic contour and morphology. No discrete hepatic lesion. Marked dilation of the gallbladder. Small stones present in the gallbladder neck. No intra or extrahepatic biliary ductal dilatation. Pancreas: Unremarkable. No pancreatic ductal dilatation or surrounding inflammatory changes. Spleen: Normal in size without focal abnormality. Adrenals/Urinary Tract: Nonspecific 1.1 cm left adrenal nodule. The right adrenal gland is normal. Large 9 cm cyst exophytic from the upper pole of the right kidney. Smaller 3.1 cm simple cyst exophytic from the anterior interpolar right kidney. Approximately 1.5 cm simple cyst exophytic from the interpolar left kidney. Numerous additional small low-attenuation lesions present bilaterally all of which are too small to  characterize but statistically likely benign cysts. No enhancing mass, hydronephrosis or nephrolithiasis. Ureters and bladder are unremarkable. Stomach/Bowel: Stomach is within normal limits. Appendix appears normal. No evidence of bowel wall thickening, distention, or inflammatory changes. Vascular/Lymphatic: Scattered atherosclerotic vascular calcifications. No evidence of aneurysm. No suspicious lymphadenopathy. No focal venous occlusion. Reproductive: Status post hysterectomy. No adnexal masses. Other: No abdominal wall hernia or abnormality. No abdominopelvic ascites. Musculoskeletal: No acute fracture or malalignment. Mild grade 1 anterolisthesis of L4 on L5. Mild grade 1 anterolisthesis of L5 on S1. Chronic appearing superior endplate compression fractures at T10 and T11. The T11 deformity may simply represent a large Schmorl's node. IMPRESSION: 1. Cholelithiasis with marked distension of the gallbladder. Findings could represent prolonged NPO versus cholecystitis. Of note, there is no significant gallbladder wall thickening or evidence of pericholecystic fluid. 2. Bilateral simple renal cysts.  No further follow-up recommended. 3. Nonspecific 1.1 cm left adrenal nodule. Statistically, this is likely a benign adenoma. Given advanced age, no further follow-up is recommended. 4. Chronic appearing superior endplate compression fractures at T10 and T11. 5. Mild grade  1 anterolisthesis of L4 on L5 and L5 on S1. 6.  Aortic Atherosclerosis (ICD10-I70.0). Electronically Signed   By: Jacqulynn Cadet M.D.   On: 05/03/2022 11:33   DG Chest Port 1 View  Result Date: 05/03/2022 CLINICAL DATA:  Chest pain EXAM: PORTABLE CHEST 1 VIEW COMPARISON:  None Available. FINDINGS: Patient is rotated to the right. Cardiac and mediastinal contours are within normal limits when accounting for patient rotation. Mild bibasilar opacities, likely due to atelectasis. No focal consolidation. Blunting of the bilateral costophrenic  angles. No evidence of pneumothorax. IMPRESSION: 1. Mild bibasilar atelectasis. 2. Blunting of the bilateral costophrenic angles, possibly due to pleural thickening or trace bilateral pleural effusions. Electronically Signed   By: Yetta Glassman M.D.   On: 05/03/2022 10:09    Procedures Procedures    Medications Ordered in ED Medications  iohexol (OMNIPAQUE) 350 MG/ML injection 100 mL (75 mLs Intravenous Contrast Given 05/03/22 1110)  sodium chloride 0.9 % bolus 500 mL (0 mLs Intravenous Stopped 05/03/22 1215)    ED Course/ Medical Decision Making/ A&P                           Medical Decision Making Amount and/or Complexity of Data Reviewed Labs: ordered. Radiology: ordered.  Risk Prescription drug management.   Patient presents with abdominal pain.  Nausea and vomiting.  Pain goes from the upper abdomen up into the chest.  Pain severe last night and has been unable to eat due to it.  EKG reassuring.  Chest x-ray done and reassuring.  No pneumonia seen.  Interpreted by me.  However with tenderness in the epigastric area relative immobility with pelvic fractures and chest pain D-dimer was done and was positive.  The scan done of the chest which did not show pneumonia, but did show thickening of the esophagus.  Esophagitis versus neoplasm.  Potential aspiration on the CT scan.  Also the abdominal CT showed a large gallbladder.  No pericholecystic fluid or thickening but does have tenderness in the epigastric and right upper quadrant area.  Ultrasound has been ordered now.  However patient still really not tolerating orals.  With age and tenderness I feel that she would benefit from admission to the hospital for further evaluation of the pain and pain control.  Will discuss with hospitalist  Patient was seen by urology yesterday and had a urinalysis that time did not show infection        Final Clinical Impression(s) / ED Diagnoses Final diagnoses:  Epigastric pain  Nausea and  vomiting, unspecified vomiting type    Rx / DC Orders ED Discharge Orders     None         Davonna Belling, MD 05/03/22 1444

## 2022-05-03 NOTE — ED Triage Notes (Signed)
Pt from home, lives with family. Pt c/o chest pain that has been on-going for the past few days. Pt has onset of dementia and is confused. Pt  changes location of pain.

## 2022-05-03 NOTE — ED Notes (Signed)
Patient transported to CT 

## 2022-05-03 NOTE — H&P (Signed)
History and Physical  Sabrina Cervantes LPF:[id number] DOB: 05-Jul-1929 DOA: 05/03/2022  Referring physician: Accepted by Dr. Alfredia Ferguson, [facility], hospitalist service, as a direct admit from [place] [facility] PCP: Einar Pheasant, MD  Outpatient Specialists: Urology, orthopedic surgery. Patient coming from: Home.  Chief Complaint: Chest pain, epigastric pain, abdominal wall pain  HPI: Sabrina Cervantes is a 88 y.o. female with medical history significant for dementia, recurrent UTIs, hyperlipidemia, who initially presented to droppage ED from home with complaints of chest pain, epigastric pain and abdominal wall pain.  Associated with poor oral intake.  About presentation to the ED, due to concern for possible PE with her chest discomfort a CT angio was done which ruled out pulmonary embolism.  No evidence of pneumonia however did show thickening of the esophagus with concern for severe esophagitis versus neoplasm.  The patient received IV fluid bolus normal saline 500 cc x 1 in the ED.  Hospitalist team was asked to admit.  The patient was accepted by Dr. Shelton Silvas, [facility], hospitalist service, and transferred to [facility] long hospital telemetry unit as observation status.  ED Course: Tmax 98.4.  BP 143/69, pulse 80, respiratory 20, O2 saturation 97% on room air.  Lab studies remarkable for serum potassium 3.3, glucose 117, calcium 12.0, albumin 4.3, troponin negative 6, 5.  WBC 11.0, hemoglobin 11.7, platelet 237.  Neutrophil 8.0.  Review of Systems: Review of systems as noted in the HPI. All other systems reviewed and are negative.   Past Medical History:  Diagnosis Date   Anemia    Anxiety    Arthritis    "left hip" (08/15/2016)   Basal cell carcinoma    forehead & nose; [facility]; deep cutting   Depression    GERD (gastroesophageal reflux disease)    Hepatitis    "think I had it as a child"   History of kidney stones    HOH (hard of hearing)    Hypercholesterolemia    Osteoporosis     Pyelonephritis, acute     s/p ureteral stent   Vasomotor rhinitis    chronic   Past Surgical History:  Procedure Laterality Date   ABDOMINAL HYSTERECTOMY  1968   secondary to prolapse, ovaries not removed   BASAL CELL CARCINOMA EXCISION     forehead & nose; [facility] cut them off; deep cutting   CATARACT EXTRACTION, BILATERAL Bilateral    COLONOSCOPY     DILATION AND CURETTAGE OF UTERUS     S/P miscarriage   LITHOTRIPSY     Dr Yves Dill, nephrolithiasis   PARATHYROIDECTOMY Left 08/15/2016    inferior minimally invasive /notes 08/15/2016   PARATHYROIDECTOMY Left 08/15/2016   Procedure: LEFT INFERIOR PARATHYROIDECTOMY;  Surgeon: Armandina Gemma, MD;  Location: [place];  Service: General;  Laterality: Left;   RETINAL DETACHMENT SURGERY     URETERAL STENT PLACEMENT      Social History:  reports that she has never smoked. She has never used smokeless tobacco. She reports that she does not drink alcohol and does not use drugs.   Allergies  Allergen Reactions   Pyridium [Phenazopyridine Hcl] Itching and Rash    Family History  Problem Relation Age of Onset   Skin cancer Father    Prostate cancer Father    Colon cancer Brother    Prostate cancer Brother    Hypertension Sister    Skin cancer Sister    Diabetes Mellitus II Sister    Breast cancer Sister 76   Breast cancer  Other        niece      Prior to Admission medications   Medication Sig Start Date End Date Taking? Authorizing Provider  acetaminophen (TYLENOL) 500 MG tablet Take 1,000 mg by mouth 3 (three) times daily.    [provider]  CALCIUM CARBONATE-VITAMIN D PO Take 1 tablet by mouth 2 (two) times daily.    [provider]  Cholecalciferol (VITAMIN D-3) 1000 UNITS CAPS Take 1,000 Units by mouth daily.     [provider]  CRANBERRY PO Take 1 tablet by mouth daily.    [provider]  cyanocobalamin (,VITAMIN B-12,) 1000 MCG/ML injection Inject 1,000 mcg into the muscle every 30  (thirty) days.    [provider]  feeding supplement (ENSURE ENLIVE / ENSURE PLUS) LIQD Take 237 mLs by mouth 2 (two) times daily between meals. 04/03/22 05/03/22  Antonieta Pert, MD  ipratropium (ATROVENT) 0.03 % nasal spray Place 1 spray into both nostrils every 12 (twelve) hours. 01/27/20   Einar Pheasant, MD  Multiple Vitamins-Iron (MULTIVITAMINS WITH IRON) TABS tablet Take 1 tablet by mouth daily.    [provider]  Omega-3 Fatty Acids (FISH OIL) 1200 MG CAPS Take 1,200 mg by mouth daily.    [provider]  Probiotic Product (PROBIOTIC PO) Take 1 tablet by mouth daily.    [provider]  Propylene Glycol-Glycerin (SOOTHE OP) Place 1 drop into both eyes daily as needed (dry eyes).    [provider]  simvastatin (ZOCOR) 40 MG tablet Take 1 tablet (40 mg total) by mouth every evening. 07/30/21   Einar Pheasant, MD  traMADol (ULTRAM) 50 MG tablet 1/2 tablet q hs prn and one tablet q day prn with physical therapy. 04/17/22   Einar Pheasant, MD  trospium (SANCTURA) 20 MG tablet Take 1 tablet (20 mg total) by mouth at bedtime. 04/26/22   Einar Pheasant, MD  Vibegron (GEMTESA) 75 MG TABS Take 75 mg by mouth daily. 05/01/22   Stoioff, Ronda Fairly, MD    Physical Exam: BP (!) 143/69 (BP Location: Right Arm)   Pulse 80   Temp 98.4 F (36.9 C) (Oral)   Resp 20   Ht 5' (1.524 m)   Wt 56.3 kg   SpO2 97%   BMI 24.24 kg/m   General: 88 y.o. year-old female well developed well nourished in no acute distress.  Alert and oriented x3. Cardiovascular: Regular rate and rhythm with no rubs or gallops.  No thyromegaly or JVD noted.  No lower extremity edema. 2/4 pulses in all 4 extremities. Respiratory: Clear to auscultation with no wheezes or rales. Good inspiratory effort. Abdomen: Soft nontender nondistended with normal bowel sounds x4 quadrants. Muskuloskeletal: No cyanosis, clubbing or edema noted bilaterally Neuro: CN II-XII intact, strength, sensation,  reflexes Skin: No ulcerative lesions noted or rashes Psychiatry: Judgement and insight appear normal. Mood is appropriate for condition and setting          Labs on Admission:  Basic Metabolic Panel: Recent Labs  Lab 05/03/22 0944  NA 141  K 3.3*  CL 100  CO2 30  GLUCOSE 117*  BUN 23  CREATININE 0.85  CALCIUM 12.0*   Liver Function Tests: Recent Labs  Lab 05/03/22 0944  AST 12*  ALT 7  ALKPHOS 69  BILITOT 0.9  PROT 6.7  ALBUMIN 4.3   Recent Labs  Lab 05/03/22 0944  LIPASE 23   No results for input(s): "AMMONIA" in the last 168 hours. CBC: Recent  Labs  Lab 05/03/22 0944  WBC 11.0*  NEUTROABS 8.0*  HGB 11.7*  HCT 36.1  MCV 89.6  PLT 237   Cardiac Enzymes: No results for input(s): "CKTOTAL", "CKMB", "CKMBINDEX", "TROPONINI" in the last 168 hours.  BNP (last 3 results) Recent Labs    03/29/22 1342 04/02/22 1209  BNP 24.9 20.5    ProBNP (last 3 results) No results for input(s): "PROBNP" in the last 8760 hours.  CBG: No results for input(s): "GLUCAP" in the last 168 hours.  Radiological Exams on Admission: US Abdomen Limited RUQ (LIVER/GB)  Result Date: 05/03/2022 CLINICAL DATA:  Epigastric pain for 2-3 days EXAM: ULTRASOUND ABDOMEN LIMITED RIGHT UPPER QUADRANT COMPARISON:  CT abdomen/pelvis 05/03/2022 FINDINGS: Gallbladder: Distended gallbladder with multiple gallstones. No significant gallbladder wall thickening. Largest gallstone measures 7 mm. No pericholecystic fluid. Negative sonographic Murphy sign. Common bile duct: Diameter: 7.4 mm Liver: No focal lesion identified. Within normal limits in parenchymal echogenicity. Portal vein is patent on color Doppler imaging with normal direction of blood flow towards the liver. Other: None. IMPRESSION: 1. Cholelithiasis without sonographic evidence of acute cholecystitis. 2. Hydropic gallbladder. If there is clinical concern regarding biliary dyskinesia, recommend a HIDA scan with ejection fraction.  Electronically Signed   By: Kathreen Devoid M.D.   On: 05/03/2022 14:51   CT Angio Chest PE W and/or Wo Contrast  Result Date: 05/03/2022 CLINICAL DATA:  A 88 year old female presents for evaluation of a 88 year old female presents for evaluation of chest pain and vomiting. EXAM: CT ANGIOGRAPHY CHEST WITH CONTRAST TECHNIQUE: Multidetector CT imaging of the chest was performed using the standard protocol during bolus administration of intravenous contrast. Multiplanar CT image reconstructions and MIPs were obtained to evaluate the vascular anatomy. RADIATION DOSE REDUCTION: This exam was performed according to the departmental dose-optimization program which includes automated exposure control, adjustment of the mA and/or kV according to patient size and/or use of iterative reconstruction technique. CONTRAST:  38m OMNIPAQUE IOHEXOL 350 MG/ML SOLN COMPARISON:  CT of the abdomen and pelvis of May 03, 2022. FINDINGS: Cardiovascular: Calcified and noncalcified aortic atherosclerotic plaque. No gross acute aortic process. Three-vessel branching pattern in the chest. Limited assessment due to bolus timing on this study which is optimized for evaluation of pulmonary vascular bed. Ascending thoracic aorta is nondilated. Central pulmonary vasculature is of normal caliber with density of 418 Hounsfield units, study quality is good. The study is negative for pulmonary embolism. Mediastinum/Nodes: Marked thickening of the esophagus with irregular thickening of the mid esophagus and mural stratification of the distal esophagus. Small hiatal hernia. Material within the esophagus. Infiltration of the fat about the esophagus. No signs of gas in the mediastinum. Small LEFT paratracheal lymph node (image 53/4) 7 mm. This is at the area of greatest esophageal irregularity and thickening. No subcarinal adenopathy. No hilar adenopathy. No thoracic inlet lymphadenopathy. No axillary lymphadenopathy. Lungs/Pleura: Areas of ground-glass.  Bronchial wall thickening. Basilar airspace disease and small bilateral pleural effusions. Material in LEFT lower lobe bronchi. Upper Abdomen: Small hiatal hernia as discussed. Large RIGHT renal cyst for which no additional dedicated imaging follow-up is recommended, partially evaluated better seen on the patient's abdominal CT images, see report for this for further detail. The upper abdomen shows no acute findings to the extent evaluated. Musculoskeletal: Osteopenia.  Spinal degenerative changes. 40-50% loss of height of the C7 level is of uncertain chronicity. Wedging of anterior superior endplates of TF57-D22associated with Schmorl's nodes, favored to be chronic. Mild loss of height also of the  T2 vertebral body, of uncertain chronicity. Review of the MIP images confirms the above findings. IMPRESSION: 1. Negative for pulmonary embolism. 2. Irregular marked thickening of the esophagus extending from mid through distal esophagus with nodular appearance particularly in the mid esophagus raising the question of esophageal neoplasm versus severe esophagitis. 3. Pulmonary parenchymal ground-glass, filling defects in LEFT lower lobe bronchi and bronchial wall thickening raising the question of aspiration. 4. Small hiatal hernia. 5. Small bilateral pleural effusions associated with above findings without signs of pleural enhancement and with bibasilar airspace disease. 6. Aortic atherosclerosis. Aortic Atherosclerosis (ICD10-I70.0). Electronically Signed   By: Zetta Bills M.D.   On: 05/03/2022 13:25   CT ABDOMEN PELVIS W CONTRAST  Result Date: 05/03/2022 CLINICAL DATA:  Left upper quadrant abdominal pain EXAM: CT ABDOMEN AND PELVIS WITH CONTRAST TECHNIQUE: Multidetector CT imaging of the abdomen and pelvis was performed using the standard protocol following bolus administration of intravenous contrast. RADIATION DOSE REDUCTION: This exam was performed according to the departmental dose-optimization program which  includes automated exposure control, adjustment of the mA and/or kV according to patient size and/or use of iterative reconstruction technique. CONTRAST:  6m OMNIPAQUE IOHEXOL 350 MG/ML SOLN COMPARISON:  CT scan of the pelvis 04/02/2022 FINDINGS: Lower chest: See concurrently obtained but separately dictated CT scan of the chest. Hepatobiliary: Normal hepatic contour and morphology. No discrete hepatic lesion. Marked dilation of the gallbladder. Small stones present in the gallbladder neck. No intra or extrahepatic biliary ductal dilatation. Pancreas: Unremarkable. No pancreatic ductal dilatation or surrounding inflammatory changes. Spleen: Normal in size without focal abnormality. Adrenals/Urinary Tract: Nonspecific 1.1 cm left adrenal nodule. The right adrenal gland is normal. Large 9 cm cyst exophytic from the upper pole of the right kidney. Smaller 3.1 cm simple cyst exophytic from the anterior interpolar right kidney. Approximately 1.5 cm simple cyst exophytic from the interpolar left kidney. Numerous additional small low-attenuation lesions present bilaterally all of which are too small to characterize but statistically likely benign cysts. No enhancing mass, hydronephrosis or nephrolithiasis. Ureters and bladder are unremarkable. Stomach/Bowel: Stomach is within normal limits. Appendix appears normal. No evidence of bowel wall thickening, distention, or inflammatory changes. Vascular/Lymphatic: Scattered atherosclerotic vascular calcifications. No evidence of aneurysm. No suspicious lymphadenopathy. No focal venous occlusion. Reproductive: Status post hysterectomy. No adnexal masses. Other: No abdominal wall hernia or abnormality. No abdominopelvic ascites. Musculoskeletal: No acute fracture or malalignment. Mild grade 1 anterolisthesis of L4 on L5. Mild grade 1 anterolisthesis of L5 on S1. Chronic appearing superior endplate compression fractures at T10 and T11. The T11 deformity may simply represent a  large Schmorl's node. IMPRESSION: 1. Cholelithiasis with marked distension of the gallbladder. Findings could represent prolonged NPO versus cholecystitis. Of note, there is no significant gallbladder wall thickening or evidence of pericholecystic fluid. 2. Bilateral simple renal cysts.  No further follow-up recommended. 3. Nonspecific 1.1 cm left adrenal nodule. Statistically, this is likely a benign adenoma. Given advanced age, no further follow-up is recommended. 4. Chronic appearing superior endplate compression fractures at T10 and T11. 5. Mild grade 1 anterolisthesis of L4 on L5 and L5 on S1. 6.  Aortic Atherosclerosis (ICD10-I70.0). Electronically Signed   By: HJacqulynn CadetM.D.   On: 05/03/2022 11:33   DG Chest Port 1 View  Result Date: 05/03/2022 CLINICAL DATA:  Chest pain EXAM: PORTABLE CHEST 1 VIEW COMPARISON:  None Available. FINDINGS: Patient is rotated to the right. Cardiac and mediastinal contours are within normal limits when accounting for patient rotation. Mild bibasilar  opacities, likely due to atelectasis. No focal consolidation. Blunting of the bilateral costophrenic angles. No evidence of pneumothorax. IMPRESSION: 1. Mild bibasilar atelectasis. 2. Blunting of the bilateral costophrenic angles, possibly due to pleural thickening or trace bilateral pleural effusions. Electronically Signed   By: Yetta Glassman M.D.   On: 05/03/2022 10:09    EKG: I independently viewed the EKG done and my findings are as followed: Sinus rhythm rate of 90.  Nonspecific ST-T changes.  QTc 443.  Assessment/Plan Present on Admission:  Esophagitis  Principal Problem:   Esophagitis  Severe esophagitis versus neoplasm seen on CT scan IV Protonix 40 mg twice daily N.p.o. until passes swallow evaluation Speech therapist consulted Consult GI in the morning Aspiration precautions  Dysphagia with concern for aspiration Speech therapist to evaluate N.p.o. until passes swallow evaluation Aspiration  precautions  Chest pain/epigastric pain/abdominal wall pain High-sensitivity troponin negative x2 No evidence of acute ischemia on 12-lead EKG  Hyperlipidemia Resume home regimen  Dementia Reorient as needed Fall precautions.  DVT prophylaxis: Subcu Lovenox daily  Code Status: Full code  Family Communication: None at bedside.  Disposition Plan: Admitted to telemetry   Consults called: None    Admission status: Observation    Status is: Observation    Kayleen Memos MD [facility] Pager [phone]  If 7PM-7AM, please contact night-coverage [email or web address] Password [facility]  05/03/2022, 7:42 PM

## 2022-05-03 NOTE — Plan of Care (Signed)
[facility] --> [facility]  Patient Sabrina Cervantes wanting to be admitted by EDP Dr. Alvino Chapel for the inability to Eat.  Patient presents with mid epigastric pain/abdominal wall pain with presenting complaints of not being able to eat or drink anything for the last few days given pain.  Has pelvic fractures and there is concern for PE so chest risk and.  CT scan of the chest did not show pneumonia but did show thickening of the esophagus with concern for esophagitis versus neoplasm.  CT scan also showed a large gallbladder and right upper quadrant ultrasound has been ordered.  Not able to take in orally and eating makes the pain worse so requested to be admitted for further work-up.  Recently saw urology and had a UA done yesterday which was negative.  Continues to have abdominal tenderness and chest discomfort and will need further evaluation for pain control as well as further work-up.  EDP is going to speak with GI for further evaluation.  Patient lives with her daughter now has a slight leukocytosis and hypokalemia.  Accepted to telemetry bed in Observation.

## 2022-05-04 ENCOUNTER — Observation Stay (HOSPITAL_BASED_OUTPATIENT_CLINIC_OR_DEPARTMENT_OTHER): Payer: Medicare Other

## 2022-05-04 ENCOUNTER — Encounter: Payer: Self-pay | Admitting: Internal Medicine

## 2022-05-04 DIAGNOSIS — D649 Anemia, unspecified: Secondary | ICD-10-CM

## 2022-05-04 DIAGNOSIS — E876 Hypokalemia: Secondary | ICD-10-CM

## 2022-05-04 DIAGNOSIS — K2289 Other specified disease of esophagus: Secondary | ICD-10-CM | POA: Diagnosis not present

## 2022-05-04 DIAGNOSIS — R933 Abnormal findings on diagnostic imaging of other parts of digestive tract: Secondary | ICD-10-CM | POA: Diagnosis not present

## 2022-05-04 DIAGNOSIS — R7989 Other specified abnormal findings of blood chemistry: Secondary | ICD-10-CM

## 2022-05-04 DIAGNOSIS — Z7189 Other specified counseling: Secondary | ICD-10-CM | POA: Diagnosis not present

## 2022-05-04 DIAGNOSIS — K2100 Gastro-esophageal reflux disease with esophagitis, without bleeding: Secondary | ICD-10-CM

## 2022-05-04 DIAGNOSIS — F0390 Unspecified dementia without behavioral disturbance: Secondary | ICD-10-CM | POA: Diagnosis not present

## 2022-05-04 DIAGNOSIS — G8929 Other chronic pain: Secondary | ICD-10-CM | POA: Diagnosis not present

## 2022-05-04 DIAGNOSIS — R1319 Other dysphagia: Secondary | ICD-10-CM

## 2022-05-04 DIAGNOSIS — R1013 Epigastric pain: Secondary | ICD-10-CM

## 2022-05-04 DIAGNOSIS — T17908A Unspecified foreign body in respiratory tract, part unspecified causing other injury, initial encounter: Secondary | ICD-10-CM

## 2022-05-04 DIAGNOSIS — K209 Esophagitis, unspecified without bleeding: Secondary | ICD-10-CM | POA: Diagnosis not present

## 2022-05-04 LAB — CBC WITH DIFFERENTIAL/PLATELET
Abs Immature Granulocytes: 0.06 10*3/uL (ref 0.00–0.07)
Basophils Absolute: 0.1 10*3/uL (ref 0.0–0.1)
Basophils Relative: 1 %
Eosinophils Absolute: 0.2 10*3/uL (ref 0.0–0.5)
Eosinophils Relative: 3 %
HCT: 30.6 % — ABNORMAL LOW (ref 36.0–46.0)
Hemoglobin: 10.1 g/dL — ABNORMAL LOW (ref 12.0–15.0)
Immature Granulocytes: 1 %
Lymphocytes Relative: 43 %
Lymphs Abs: 2.6 10*3/uL (ref 0.7–4.0)
MCH: 30 pg (ref 26.0–34.0)
MCHC: 33 g/dL (ref 30.0–36.0)
MCV: 90.8 fL (ref 80.0–100.0)
Monocytes Absolute: 0.6 10*3/uL (ref 0.1–1.0)
Monocytes Relative: 10 %
Neutro Abs: 2.6 10*3/uL (ref 1.7–7.7)
Neutrophils Relative %: 42 %
Platelets: 228 10*3/uL (ref 150–400)
RBC: 3.37 MIL/uL — ABNORMAL LOW (ref 3.87–5.11)
RDW: 15.1 % (ref 11.5–15.5)
WBC: 6.2 10*3/uL (ref 4.0–10.5)
nRBC: 0 % (ref 0.0–0.2)

## 2022-05-04 LAB — PHOSPHORUS: Phosphorus: 2.9 mg/dL (ref 2.5–4.6)

## 2022-05-04 LAB — COMPREHENSIVE METABOLIC PANEL
ALT: 8 U/L (ref 0–44)
AST: 11 U/L — ABNORMAL LOW (ref 15–41)
Albumin: 3.1 g/dL — ABNORMAL LOW (ref 3.5–5.0)
Alkaline Phosphatase: 58 U/L (ref 38–126)
Anion gap: 7 (ref 5–15)
BUN: 22 mg/dL (ref 8–23)
CO2: 25 mmol/L (ref 22–32)
Calcium: 9.5 mg/dL (ref 8.9–10.3)
Chloride: 110 mmol/L (ref 98–111)
Creatinine, Ser: 0.81 mg/dL (ref 0.44–1.00)
GFR, Estimated: >60 mL/min (ref >60)
Glucose, Bld: 81 mg/dL (ref 70–99)
Potassium: 3.9 mmol/L (ref 3.5–5.1)
Sodium: 142 mmol/L (ref 135–145)
Total Bilirubin: 0.7 mg/dL (ref 0.3–1.2)
Total Protein: 5.2 g/dL — ABNORMAL LOW (ref 6.5–8.1)

## 2022-05-04 LAB — MAGNESIUM: Magnesium: 1.7 mg/dL (ref 1.7–2.4)

## 2022-05-04 MED ORDER — ENSURE ENLIVE PO LIQD: 237.0000 mL | Freq: Two times a day (BID) | ORAL | 0 refills | Status: AC

## 2022-05-04 MED ORDER — PANTOPRAZOLE SODIUM 40 MG PO TBEC: 40.0000 mg | DELAYED_RELEASE_TABLET | Freq: Every day | ORAL | 0 refills | Status: DC

## 2022-05-04 MED ORDER — ENSURE ENLIVE PO LIQD: 237.0000 mL | Freq: Two times a day (BID) | ORAL | 0 refills | Status: DC

## 2022-05-04 NOTE — Progress Notes (Signed)
Bilateral lower extremity venous duplex has been completed. Preliminary results can be found in CV Proc through chart review.  Results were given to Dr. Cyndia Skeeters.  05/04/22 11:20 AM Sabrina Cervantes RVT

## 2022-05-04 NOTE — Consult Note (Signed)
Consultation  Referring Provider:     [facility] Primary Care Physician:  Einar Pheasant, MD Primary Gastroenterologist:      [place] [facility]    Reason for Consultation:     Epigastric pain, esophageal thickening on CT            HPI:   Sabrina Cervantes is a 88 y.o. female with history of dementia was brought to [place] ER by family for evaluation of chest pain and epigastric abdominal pain associated with poor oral intake.  She was constipated and vomiting for 2 to 3 days prior to the presentation.  In the ER work-up was negative for PE.  CT chest showed thickening of distal esophagus suggestive of esophagitis but cannot exclude neoplasm. She has gallstones with distended gallbladder but no gallbladder wall thickening or pericholecystic fluid.  Right upper quadrant ultrasound negative for Murphy sign  Patient denied any pain on swallowing or difficulty swallowing with liquids or soft diet.  She had bedside evaluation by speech and swallow. Denies any abdominal pain, nausea or vomiting.  Overall she feels better  Past Medical History:  Diagnosis Date   Anemia    Anxiety    Arthritis    "left hip" (08/15/2016)   Basal cell carcinoma    forehead & nose; [place] cut them off; deep cutting   Depression    GERD (gastroesophageal reflux disease)    Hepatitis    "think I had it as a child"   History of kidney stones    HOH (hard of hearing)    Hypercholesterolemia    Osteoporosis    Pyelonephritis, acute     s/p ureteral stent   Vasomotor rhinitis    chronic    Past Surgical History:  Procedure Laterality Date   ABDOMINAL HYSTERECTOMY  1968   secondary to prolapse, ovaries not removed   BASAL CELL CARCINOMA EXCISION     forehead & nose; [place] cut them off; deep cutting   CATARACT EXTRACTION, BILATERAL Bilateral    COLONOSCOPY     DILATION AND CURETTAGE OF UTERUS     S/P miscarriage   LITHOTRIPSY     Dr Yves Dill, nephrolithiasis   PARATHYROIDECTOMY Left 08/15/2016     inferior minimally invasive /notes 08/15/2016   PARATHYROIDECTOMY Left 08/15/2016   Procedure: LEFT INFERIOR PARATHYROIDECTOMY;  Surgeon: Armandina Gemma, MD;  Location: [place];  Service: General;  Laterality: Left;   RETINAL DETACHMENT SURGERY     URETERAL STENT PLACEMENT      Family History  Problem Relation Age of Onset   Skin cancer Father    Prostate cancer Father    Colon cancer Brother    Prostate cancer Brother    Hypertension Sister    Skin cancer Sister    Diabetes Mellitus II Sister    Breast cancer Sister 56   Breast cancer Other        niece     Social History   Tobacco Use   Smoking status: Never   Smokeless tobacco: Never  Substance Use Topics   Alcohol use: No    Alcohol/week: 0.0 standard drinks of alcohol   Drug use: No    Prior to Admission medications   Medication Sig Start Date End Date Taking? Authorizing Provider  acetaminophen (TYLENOL) 500 MG tablet Take 1,000 mg by mouth 3 (three) times daily.   Yes [provider]  CALCIUM CARBONATE-VITAMIN D PO Take 1 tablet by mouth 2 (two) times daily.   Yes  [provider]  Cholecalciferol (VITAMIN D-3) 1000 UNITS CAPS Take 1,000 Units by mouth daily.    Yes [provider]  CRANBERRY PO Take 1 tablet by mouth daily.   Yes [provider]  cyanocobalamin (,VITAMIN B-12,) 1000 MCG/ML injection Inject 1,000 mcg into the muscle every 30 (thirty) days.   Yes [provider]  ibuprofen (ADVIL) 200 MG tablet Take 200 mg by mouth every 6 (six) hours as needed for moderate pain or headache.   Yes [provider]  Multiple Vitamins-Iron (MULTIVITAMINS WITH IRON) TABS tablet Take 1 tablet by mouth daily.   Yes [provider]  Omega-3 Fatty Acids (FISH OIL) 1200 MG CAPS Take 1,200 mg by mouth daily.   Yes [provider]  Probiotic Product (PROBIOTIC PO) Take 1 tablet by mouth daily.   Yes [provider]  Propylene Glycol-Glycerin (SOOTHE  OP) Place 1 drop into both eyes daily as needed (dry eyes).   Yes [provider]  simvastatin (ZOCOR) 40 MG tablet Take 1 tablet (40 mg total) by mouth every evening. 07/30/21  Yes Einar Pheasant, MD  traMADol (ULTRAM) 50 MG tablet 1/2 tablet q hs prn and one tablet q day prn with physical therapy. Patient taking differently: Take 25-50 mg by mouth 2 (two) times daily as needed for moderate pain. with physical therapy. 04/17/22  Yes Einar Pheasant, MD  Vibegron (GEMTESA) 75 MG TABS Take 75 mg by mouth daily. 05/01/22  Yes Stoioff, Ronda Fairly, MD  ipratropium (ATROVENT) 0.03 % nasal spray Place 1 spray into both nostrils every 12 (twelve) hours. Patient not taking: Reported on 05/03/2022 01/27/20   Einar Pheasant, MD  trospium (SANCTURA) 20 MG tablet Take 1 tablet (20 mg total) by mouth at bedtime. Patient not taking: Reported on 05/03/2022 04/26/22   Einar Pheasant, MD    Current Facility-Administered Medications  Medication Dose Route Frequency Provider Last Rate Last Admin   acetaminophen (TYLENOL) tablet 650 mg  650 mg Oral Q6H PRN Kayleen Memos, DO   650 mg at 05/04/22 2440   enoxaparin (LOVENOX) injection 40 mg  40 mg Subcutaneous Q24H Irene Pap N, DO   40 mg at 05/03/22 2029   lactated ringers 1,000 mL with potassium chloride 40 mEq infusion   Intravenous Continuous Kayleen Memos, DO 50 mL/hr at 05/04/22 1027 Infusion Verify at 05/04/22 2536   melatonin tablet 5 mg  5 mg Oral QHS PRN Irene Pap N, DO       oxyCODONE (Oxy IR/ROXICODONE) immediate release tablet 5 mg  5 mg Oral Q6H PRN Irene Pap N, DO   5 mg at 05/04/22 0216   pantoprazole (PROTONIX) injection 40 mg  40 mg Intravenous BID Irene Pap N, DO   40 mg at 05/03/22 2144   polyethylene glycol (MIRALAX / GLYCOLAX) packet 17 g  17 g Oral Daily PRN Irene Pap N, DO        Allergies as of 05/03/2022 - Review Complete 05/03/2022  Allergen Reaction Noted   Pyridium [phenazopyridine hcl] Itching and Rash 09/29/2012      Review of Systems:    This is positive for those things mentioned in the HPI,All other review of systems are negative.       Physical Exam:  Vital signs in last 24 hours: Temp:  [97.5 F (36.4 C)-98.4 F (36.9 C)] 97.5 F (36.4 C) (07/01 0727) Pulse Rate:  [63-93] 63 (07/01 0727) Resp:  [14-20] 18 (07/01 0727) BP: (111-163)/(55-87) 121/55 (07/01 0727)  SpO2:  [94 %-98 %] 97 % (07/01 0727) Weight:  [56.3 kg] 56.3 kg (06/30 1928)    General:  Well-developed, well-nourished and in no acute distress Neck:   supple w/o thyromegaly or mass.  Lungs: Clear to auscultation bilaterally. Heart:  S1S2, no rubs, murmurs, gallops. Abdomen:  soft, non-tender, no hepatosplenomegaly, hernia, or mass and BS+.  Extremities:   no edema Neuro:  A&O x 2.  Psych:  appropriate mood and  Affect.   Data Reviewed:   LAB RESULTS: Recent Labs    05/03/22 0944 05/04/22 0707  WBC 11.0* 6.2  HGB 11.7* 10.1*  HCT 36.1 30.6*  PLT 237 228   BMET Recent Labs    05/03/22 0944 05/04/22 0707  NA 141 142  K 3.3* 3.9  CL 100 110  CO2 30 25  GLUCOSE 117* 81  BUN 23 22  CREATININE 0.85 0.81  CALCIUM 12.0* 9.5   LFT Recent Labs    05/04/22 0707  PROT 5.2*  ALBUMIN 3.1*  AST 11*  ALT 8  ALKPHOS 58  BILITOT 0.7   PT/INR No results for input(s): "LABPROT", "INR" in the last 72 hours.  STUDIES: US Abdomen Limited RUQ (LIVER/GB)  Result Date: 05/03/2022 CLINICAL DATA:  Epigastric pain for 2-3 days EXAM: ULTRASOUND ABDOMEN LIMITED RIGHT UPPER QUADRANT COMPARISON:  CT abdomen/pelvis 05/03/2022 FINDINGS: Gallbladder: Distended gallbladder with multiple gallstones. No significant gallbladder wall thickening. Largest gallstone measures 7 mm. No pericholecystic fluid. Negative sonographic Murphy sign. Common bile duct: Diameter: 7.4 mm Liver: No focal lesion identified. Within normal limits in parenchymal echogenicity. Portal vein is patent on color Doppler imaging with normal direction of  blood flow towards the liver. Other: None. IMPRESSION: 1. Cholelithiasis without sonographic evidence of acute cholecystitis. 2. Hydropic gallbladder. If there is clinical concern regarding biliary dyskinesia, recommend a HIDA scan with ejection fraction. Electronically Signed   By: Kathreen Devoid M.D.   On: 05/03/2022 14:51   CT Angio Chest PE W and/or Wo Contrast  Result Date: 05/03/2022 CLINICAL DATA:  A 88 year old female presents for evaluation of a 88 year old female presents for evaluation of chest pain and vomiting. EXAM: CT ANGIOGRAPHY CHEST WITH CONTRAST TECHNIQUE: Multidetector CT imaging of the chest was performed using the standard protocol during bolus administration of intravenous contrast. Multiplanar CT image reconstructions and MIPs were obtained to evaluate the vascular anatomy. RADIATION DOSE REDUCTION: This exam was performed according to the departmental dose-optimization program which includes automated exposure control, adjustment of the mA and/or kV according to patient size and/or use of iterative reconstruction technique. CONTRAST:  18m OMNIPAQUE IOHEXOL 350 MG/ML SOLN COMPARISON:  CT of the abdomen and pelvis of May 03, 2022. FINDINGS: Cardiovascular: Calcified and noncalcified aortic atherosclerotic plaque. No gross acute aortic process. Three-vessel branching pattern in the chest. Limited assessment due to bolus timing on this study which is optimized for evaluation of pulmonary vascular bed. Ascending thoracic aorta is nondilated. Central pulmonary vasculature is of normal caliber with density of 418 Hounsfield units, study quality is good. The study is negative for pulmonary embolism. Mediastinum/Nodes: Marked thickening of the esophagus with irregular thickening of the mid esophagus and mural stratification of the distal esophagus. Small hiatal hernia. Material within the esophagus. Infiltration of the fat about the esophagus. No signs of gas in the mediastinum. Small LEFT  paratracheal lymph node (image 53/4) 7 mm. This is at the area of greatest esophageal irregularity and thickening. No subcarinal adenopathy. No hilar adenopathy. No thoracic inlet lymphadenopathy. No axillary lymphadenopathy.  Lungs/Pleura: Areas of ground-glass. Bronchial wall thickening. Basilar airspace disease and small bilateral pleural effusions. Material in LEFT lower lobe bronchi. Upper Abdomen: Small hiatal hernia as discussed. Large RIGHT renal cyst for which no additional dedicated imaging follow-up is recommended, partially evaluated better seen on the patient's abdominal CT images, see report for this for further detail. The upper abdomen shows no acute findings to the extent evaluated. Musculoskeletal: Osteopenia.  Spinal degenerative changes. 40-50% loss of height of the C7 level is of uncertain chronicity. Wedging of anterior superior endplates of X83-J82 associated with Schmorl's nodes, favored to be chronic. Mild loss of height also of the T2 vertebral body, of uncertain chronicity. Review of the MIP images confirms the above findings. IMPRESSION: 1. Negative for pulmonary embolism. 2. Irregular marked thickening of the esophagus extending from mid through distal esophagus with nodular appearance particularly in the mid esophagus raising the question of esophageal neoplasm versus severe esophagitis. 3. Pulmonary parenchymal ground-glass, filling defects in LEFT lower lobe bronchi and bronchial wall thickening raising the question of aspiration. 4. Small hiatal hernia. 5. Small bilateral pleural effusions associated with above findings without signs of pleural enhancement and with bibasilar airspace disease. 6. Aortic atherosclerosis. Aortic Atherosclerosis (ICD10-I70.0). Electronically Signed   By: Zetta Bills M.D.   On: 05/03/2022 13:25   CT ABDOMEN PELVIS W CONTRAST  Result Date: 05/03/2022 CLINICAL DATA:  Left upper quadrant abdominal pain EXAM: CT ABDOMEN AND PELVIS WITH CONTRAST  TECHNIQUE: Multidetector CT imaging of the abdomen and pelvis was performed using the standard protocol following bolus administration of intravenous contrast. RADIATION DOSE REDUCTION: This exam was performed according to the departmental dose-optimization program which includes automated exposure control, adjustment of the mA and/or kV according to patient size and/or use of iterative reconstruction technique. CONTRAST:  10m OMNIPAQUE IOHEXOL 350 MG/ML SOLN COMPARISON:  CT scan of the pelvis 04/02/2022 FINDINGS: Lower chest: See concurrently obtained but separately dictated CT scan of the chest. Hepatobiliary: Normal hepatic contour and morphology. No discrete hepatic lesion. Marked dilation of the gallbladder. Small stones present in the gallbladder neck. No intra or extrahepatic biliary ductal dilatation. Pancreas: Unremarkable. No pancreatic ductal dilatation or surrounding inflammatory changes. Spleen: Normal in size without focal abnormality. Adrenals/Urinary Tract: Nonspecific 1.1 cm left adrenal nodule. The right adrenal gland is normal. Large 9 cm cyst exophytic from the upper pole of the right kidney. Smaller 3.1 cm simple cyst exophytic from the anterior interpolar right kidney. Approximately 1.5 cm simple cyst exophytic from the interpolar left kidney. Numerous additional small low-attenuation lesions present bilaterally all of which are too small to characterize but statistically likely benign cysts. No enhancing mass, hydronephrosis or nephrolithiasis. Ureters and bladder are unremarkable. Stomach/Bowel: Stomach is within normal limits. Appendix appears normal. No evidence of bowel wall thickening, distention, or inflammatory changes. Vascular/Lymphatic: Scattered atherosclerotic vascular calcifications. No evidence of aneurysm. No suspicious lymphadenopathy. No focal venous occlusion. Reproductive: Status post hysterectomy. No adnexal masses. Other: No abdominal wall hernia or abnormality. No  abdominopelvic ascites. Musculoskeletal: No acute fracture or malalignment. Mild grade 1 anterolisthesis of L4 on L5. Mild grade 1 anterolisthesis of L5 on S1. Chronic appearing superior endplate compression fractures at T10 and T11. The T11 deformity may simply represent a large Schmorl's node. IMPRESSION: 1. Cholelithiasis with marked distension of the gallbladder. Findings could represent prolonged NPO versus cholecystitis. Of note, there is no significant gallbladder wall thickening or evidence of pericholecystic fluid. 2. Bilateral simple renal cysts.  No further follow-up recommended. 3. Nonspecific 1.1  cm left adrenal nodule. Statistically, this is likely a benign adenoma. Given advanced age, no further follow-up is recommended. 4. Chronic appearing superior endplate compression fractures at T10 and T11. 5. Mild grade 1 anterolisthesis of L4 on L5 and L5 on S1. 6.  Aortic Atherosclerosis (ICD10-I70.0). Electronically Signed   By: Jacqulynn Cadet M.D.   On: 05/03/2022 11:33   DG Chest Port 1 View  Result Date: 05/03/2022 CLINICAL DATA:  Chest pain EXAM: PORTABLE CHEST 1 VIEW COMPARISON:  None Available. FINDINGS: Patient is rotated to the right. Cardiac and mediastinal contours are within normal limits when accounting for patient rotation. Mild bibasilar opacities, likely due to atelectasis. No focal consolidation. Blunting of the bilateral costophrenic angles. No evidence of pneumothorax. IMPRESSION: 1. Mild bibasilar atelectasis. 2. Blunting of the bilateral costophrenic angles, possibly due to pleural thickening or trace bilateral pleural effusions. Electronically Signed   By: Yetta Glassman M.D.   On: 05/03/2022 10:09     PREVIOUS ENDOSCOPIES:            None    Impression / Plan:   88 year old very pleasant female admitted with epigastric and retrosternal pain, noted esophageal thickening on CT scan  Likely etiology is severe erosive esophagitis secondary to uncontrolled acid reflux but  cannot exclude underlying neoplastic lesion Discussed in detail the benefits and potential risk associated with endoscopic evaluation, family opted to not proceed with EGD Will manage conservatively with PPI, pantoprazole 40 mg daily Antireflux measures Continue soft pured diet and aspiration precautions  She has cholelithiasis but no evidence of acute cholecystitis  Chronic idiopathic constipation: Use MiraLAX 1 capful daily and titrate up as needed to have soft bowel movement Increase water intake to 8 cups daily  GI will sign off, available if have any questions    Damaris Hippo , MD [phone]

## 2022-05-04 NOTE — Evaluation (Signed)
Clinical/Bedside Swallow Evaluation Patient Details  Name: Sabrina Cervantes MRN: [id number] Date of Birth: [birth year]  Today's Date: 05/04/2022 Time: SLP Start Time (ACUTE ONLY): 4010 SLP Stop Time (ACUTE ONLY): 0900 SLP Time Calculation (min) (ACUTE ONLY): 25 min  Past Medical History:  Past Medical History:  Diagnosis Date   Anemia    Anxiety    Arthritis    "left hip" (08/15/2016)   Basal cell carcinoma    forehead & nose; [place] cut them off; deep cutting   Depression    GERD (gastroesophageal reflux disease)    Hepatitis    "think I had it as a child"   History of kidney stones    HOH (hard of hearing)    Hypercholesterolemia    Osteoporosis    Pyelonephritis, acute     s/p ureteral stent   Vasomotor rhinitis    chronic   Past Surgical History:  Past Surgical History:  Procedure Laterality Date   ABDOMINAL HYSTERECTOMY  1968   secondary to prolapse, ovaries not removed   BASAL CELL CARCINOMA EXCISION     forehead & nose; [place] cut them off; deep cutting   CATARACT EXTRACTION, BILATERAL Bilateral    COLONOSCOPY     DILATION AND CURETTAGE OF UTERUS     S/P miscarriage   LITHOTRIPSY     Dr Yves Dill, nephrolithiasis   PARATHYROIDECTOMY Left 08/15/2016    inferior minimally invasive /notes 08/15/2016   PARATHYROIDECTOMY Left 08/15/2016   Procedure: LEFT INFERIOR PARATHYROIDECTOMY;  Surgeon: Armandina Gemma, MD;  Location: [place];  Service: General;  Laterality: Left;   RETINAL DETACHMENT SURGERY     URETERAL STENT PLACEMENT     HPI:  Patient is a 88 y.o. female with PMH: dementia, recurrent UTI's, GERD, osteoporosis, arthritis, HLD who presented initially to [facility] ED from home with c/o chest pain, epigastric pain and abdominal wall pain; associated poor oral intake; vomitted once three days prior to presentation. In ED CT angio was done which r/o PE. No obvious lobular infiltrates however CT scan showed thickening of esophagus with concern for severe esophagitis  versus neoplasm. Patient made NPO awaiting SLP swallow evaluation.    Assessment / Plan / Recommendation  Clinical Impression  Patient currently presenting with suspected primary esophageal dysphagia based on recent h/o globus sensation and pain with swallowing. No overt s/s aspiration or penetration observed with thin liquids (water) or solids (gelatin) with swallow initiation appearing timely. GI MD entered room towards end of evaluation and suspecting esophagitis from GERD and with family in agreement not to proceed with any invasive testing (EGD). SLP spoke briefly with GI MD regarding PO recommendations and both in agreement to start with Dys 1 (puree) solids, thin liquids and crushed meds. SLP will follow briefly for diet toleration and readiness to upgrade solids. SLP Visit Diagnosis: Dysphagia, unspecified (R13.10)    Aspiration Risk  No limitations    Diet Recommendation Dysphagia 1 (Puree);Thin liquid   Liquid Administration via: Cup;Straw Medication Administration: Crushed with puree Supervision: Patient able to self feed;Intermittent supervision to cue for compensatory strategies Compensations: Slow rate;Small sips/bites Postural Changes: Seated upright at 90 degrees;Remain upright for at least 30 minutes after po intake    Other  Recommendations Oral Care Recommendations: Oral care BID    Recommendations for follow up therapy are one component of a multi-disciplinary discharge planning process, led by the attending physician.  Recommendations may be updated based on patient status, additional functional criteria and insurance authorization.  Follow up  Recommendations No SLP follow up      Assistance Recommended at Discharge None  Functional Status Assessment Patient has had a recent decline in their functional status and demonstrates the ability to make significant improvements in function in a reasonable and predictable amount of time.  Frequency and Duration min 1 x/week   1 week       Prognosis Prognosis for Safe Diet Advancement: Good      Swallow Study   General Date of Onset: 05/03/22 HPI: Patient is a 88 y.o. female with PMH: dementia, recurrent UTI's, GERD, osteoporosis, arthritis, HLD who presented initially to [place] ED from home with c/o chest pain, epigastric pain and abdominal wall pain; associated poor oral intake; vomitted once three days prior to presentation. In ED CT angio was done which r/o PE. No obvious lobular infiltrates however CT scan showed thickening of esophagus with concern for severe esophagitis versus neoplasm. Patient made NPO awaiting SLP swallow evaluation. Type of Study: Bedside Swallow Evaluation Previous Swallow Assessment: none found Diet Prior to this Study: NPO Temperature Spikes Noted: No Respiratory Status: Room air History of Recent Intubation: No Behavior/Cognition: Alert;Cooperative;Pleasant mood Oral Cavity Assessment: Within Functional Limits Oral Care Completed by SLP: No Oral Cavity - Dentition: Adequate natural dentition;Missing dentition Vision: Functional for self-feeding Self-Feeding Abilities: Able to feed self;Needs set up Patient Positioning: Upright in bed Baseline Vocal Quality: Normal Volitional Cough: Cognitively unable to elicit Volitional Swallow: Able to elicit    Oral/Motor/Sensory Function Overall Oral Motor/Sensory Function: Within functional limits   Ice Chips     Thin Liquid Thin Liquid: Within functional limits Presentation: Straw;Self Fed    Nectar Thick     Honey Thick     Puree     Solid     Solid: Within functional limits Presentation: Self Fed Other Comments: (gelatin)     Sonia Baller, MA, CCC-SLP Speech Therapy

## 2022-05-04 NOTE — Discharge Summary (Signed)
Physician Discharge Summary  Sabrina Cervantes:[id number] DOB: June 25, 1929 DOA: 05/03/2022  PCP: Einar Pheasant, MD  Admit date: 05/03/2022 Discharge date: 05/04/2022 Admitted From: Home Disposition: Home Recommendations for Outpatient Follow-up:  Follow ups as below. Check BMP and CBC at follow-up. Please follow up on the following pending results: None  Home Health: Not indicated Equipment/Devices: Patient has rolling walker at home  Discharge Condition: Stable CODE STATUS: DNR  Follow-up Information     Einar Pheasant, MD. Schedule an appointment as soon as possible for a visit in 1 week(s).   Specialty: Internal Medicine Contact information: [address] [place] [address] [phone]                 Hospital course 88 year old F with PMH of severe dementia, recurrent UTIs, overactive bladder and HLD brought to ED by family due to epigastric abdominal pain and chest pain as well as dysphagia and poor p.o. intake for about 4 days.  She had an episode of emesis 3 days prior to presentation.  In ED, stable vitals.  Labs significant for WBC 11. Hgb 11.7.  K 3.3 and  Ca 12. Albumin 4.3.  Lipase normal.  Troponin negative.  D-dimer elevated to 2.1.  CXR with slight blunting of bilateral costophrenic angles.  CTA chest negative for PE but raising concern for irregular marked thickening of esophagus extending from mid through distal esophagus with nodular appearance raising question for esophageal neoplasm versus severe esophagitis, groundglass and filling defect in LLL bronchi and bronchial wall thickening raising question for aspiration and small bilateral pleural effusions. Patient was started on PPI.  GI consulted.   The next day, pain seems to have resolved.  No respiratory distress or symptoms.  Evaluated by GI who recommended PPI for possible severe esophagitis versus EGD given her advanced age and severe dementia.  Evaluated by SLP who recommended  dysphagia 1 (pured diet).  LE venous Doppler negative for DVT.  Hypercalcemia and leukocytosis resolved.  She is discharged home with with family.  See individual problem list below for more.   Problems addressed during this hospitalization Principal Problem:   Esophagitis Active Problems:   GERD (gastroesophageal reflux disease)   Hypokalemia   Dementia without behavioral disturbance (HCC)   Esophageal dysphagia   Hypercalcemia   Epigastric abdominal pain   Aspiration into airway   Epigastric abdominal pain/esophageal dysphagia/GERD/esophagitis-CT shows mid to distal irregular esophageal wall thickening raising concern for neoplasm versus severe esophagitis. -Evaluated by GI.  EGD deferred after risk-benefit discussion given her age and severe dementia.  GI recommended p.o. Protonix 40 mg daily -Evaluated by SLP-recommended dysphagia 1 diet and aspiration precaution  Dysphagia/aspiration into airway-no respiratory symptoms. -Dysphagia 1 diet and aspiration precaution  Severe dementia without behavioral disturbance -Reorientation and delirium precautions  Normocytic anemia: H&H relatively stable.  Slight drop likely dilutional.  Hypokalemia/hypercalcemia-likely due to dehydration in the setting of poor p.o. intake -Resolved.  Elevated D-dimer: CTA chest negative for PE.  LE venous Doppler negative for DVT.  Bandemia: Likely demargination.  Resolved without antibiotics.  Goal of care counseling: Discussed CODE STATUS with patient's daughter and son-in-law at bedside.  Family states she is DNR/DNI.  CODE STATUS changed.           Vital signs Vitals:   05/03/22 1928 05/03/22 2317 05/04/22 0316 05/04/22 0727  BP:  121/63 111/61 (!) 121/55  Pulse:  76 64 63  Temp:  98 F (36.7 C) 97.6 F (36.4 C) (!) 97.5  F (36.4 C)  Resp:  '18 16 18  '$ Height:      Weight: 56.3 kg     SpO2:  98% 98% 97%  TempSrc:  Oral Oral Oral  BMI (Calculated): 24.24        Discharge  exam  GENERAL: No apparent distress.  Nontoxic. HEENT: MMM.  Vision and hearing grossly intact.  NECK: Supple.  No apparent JVD.  RESP:  No IWOB.  Fair aeration bilaterally. CVS:  RRR. Heart sounds normal.  ABD/GI/GU: BS+. Abd soft, NTND.  MSK/EXT:  Moves extremities. No apparent deformity. No edema.  SKIN: no apparent skin lesion or wound NEURO: Awake and alert.  Only oriented to self.  Follows commands.  No apparent focal neuro deficit. PSYCH: Calm. Normal affect.   Discharge Instructions Discharge Instructions     Call MD for:  extreme fatigue   Complete by: As directed    Call MD for:  persistant dizziness or light-headedness   Complete by: As directed    Call MD for:  persistant nausea and vomiting   Complete by: As directed    Call MD for:  severe uncontrolled pain   Complete by: As directed    Call MD for:  temperature >100.4   Complete by: As directed    Diet general   Complete by: As directed    Dysphagia 1 (Puree);Thin liquid    Liquid Administration via: Cup;Straw Medication Administration: Crushed with puree Supervision: Patient able to self feed;Intermittent supervision to cue for compensatory strategies Compensations: Slow rate;Small sips/bites Postural Changes: Seated upright at 90 degrees;Remain upright for at least 30 minutes after po intake   Discharge instructions   Complete by: As directed    It has been a pleasure taking care of you!  You were hospitalized due to chest pain, epigastric pain, abdominal pain, difficulty swallowing and not eating well.  The CT of the chest showed some thickness at the lower end of the esophagus (swallowing tube).  This could be due to inflammation from acid reflux but cannot exclude malignancy or cancer.  Given age and risk factor, we did not feel endoscopy would be helpful.  We recommend taking Protonix (acid reflux medication) once a day.  We also recommend avoiding over-the-counter pain medication such as ibuprofen.  You  can can safely take Tylenol as needed.  We also recommend avoiding food items that could increase risk of reflux.  You may continue pured diet for the next few days and slowly increase food texture as tolerated.  Stay upright during meals and after meals.    Take care,   Increase activity slowly   Complete by: As directed       Allergies as of 05/04/2022       Reactions   Pyridium [phenazopyridine Hcl] Itching, Rash        Medication List     STOP taking these medications    ibuprofen 200 MG tablet Commonly known as: ADVIL       TAKE these medications    acetaminophen 500 MG tablet Commonly known as: TYLENOL Take 1,000 mg by mouth 3 (three) times daily.   CALCIUM CARBONATE-VITAMIN D PO Take 1 tablet by mouth 2 (two) times daily.   CRANBERRY PO Take 1 tablet by mouth daily.   cyanocobalamin 1000 MCG/ML injection Commonly known as: (VITAMIN B-12) Inject 1,000 mcg into the muscle every 30 (thirty) days.   feeding supplement Liqd Take 237 mLs by mouth 2 (two) times daily between meals.  Fish Oil 1200 MG Caps Take 1,200 mg by mouth daily.   Gemtesa 75 MG Tabs Generic drug: Vibegron Take 75 mg by mouth daily.   ipratropium 0.03 % nasal spray Commonly known as: ATROVENT Place 1 spray into both nostrils every 12 (twelve) hours.   multivitamins with iron Tabs tablet Take 1 tablet by mouth daily.   pantoprazole 40 MG tablet Commonly known as: Protonix Take 1 tablet (40 mg total) by mouth daily.   PROBIOTIC PO Take 1 tablet by mouth daily.   simvastatin 40 MG tablet Commonly known as: ZOCOR Take 1 tablet (40 mg total) by mouth every evening.   SOOTHE OP Place 1 drop into both eyes daily as needed (dry eyes).   traMADol 50 MG tablet Commonly known as: Ultram 1/2 tablet q hs prn and one tablet q day prn with physical therapy. What changed:  how much to take how to take this when to take this reasons to take this additional instructions    trospium 20 MG tablet Commonly known as: SANCTURA Take 1 tablet (20 mg total) by mouth at bedtime.   Vitamin D-3 25 MCG (1000 UT) Caps Take 1,000 Units by mouth daily.        Consultations: Gastroenterology  Procedures/Studies:   VAS [place] LOWER EXTREMITY VENOUS (DVT)  Result Date: 05/04/2022  Lower Venous DVT Study Patient Name:  Sabrina Cervantes  Date of Exam:   05/04/2022 Medical Rec #: [id number]   Accession #:    [id number] Date of Birth: May 02, 1929    Patient Gender: F Patient Age:   54 years Exam Location:  [facility] Procedure:      VAS [place] LOWER EXTREMITY VENOUS (DVT) Referring Phys: Bretta Bang Lorrie Gargan --------------------------------------------------------------------------------  Indications: Elevated Ddimer.  Risk Factors: None identified. Limitations: Poor ultrasound/tissue interface and patient positioning, patient pain tolerance. Comparison Study: No prior studies. Performing Technologist: Oliver Hum RVT  Examination Guidelines: A complete evaluation includes B-mode imaging, spectral Doppler, color Doppler, and power Doppler as needed of all accessible portions of each vessel. Bilateral testing is considered an integral part of a complete examination. Limited examinations for reoccurring indications may be performed as noted. The reflux portion of the exam is performed with the patient in reverse Trendelenburg.  +---------+---------------+---------+-----------+----------+--------------+ RIGHT    CompressibilityPhasicitySpontaneityPropertiesThrombus Aging +---------+---------------+---------+-----------+----------+--------------+ CFV      Full           Yes      Yes                                 +---------+---------------+---------+-----------+----------+--------------+ SFJ      Full                                                        +---------+---------------+---------+-----------+----------+--------------+ FV Prox  Full                                                         +---------+---------------+---------+-----------+----------+--------------+ FV Mid   Full                                                        +---------+---------------+---------+-----------+----------+--------------+  FV DistalFull                                                        +---------+---------------+---------+-----------+----------+--------------+ PFV      Full                                                        +---------+---------------+---------+-----------+----------+--------------+ POP      Full           Yes      Yes                                 +---------+---------------+---------+-----------+----------+--------------+ PTV      Full                                                        +---------+---------------+---------+-----------+----------+--------------+ PERO     Full                                                        +---------+---------------+---------+-----------+----------+--------------+   +---------+---------------+---------+-----------+----------+--------------+ LEFT     CompressibilityPhasicitySpontaneityPropertiesThrombus Aging +---------+---------------+---------+-----------+----------+--------------+ CFV      Full           Yes      Yes                                 +---------+---------------+---------+-----------+----------+--------------+ SFJ      Full                                                        +---------+---------------+---------+-----------+----------+--------------+ FV Prox  Full                                                        +---------+---------------+---------+-----------+----------+--------------+ FV Mid   Full                                                        +---------+---------------+---------+-----------+----------+--------------+ FV DistalFull                                                         +---------+---------------+---------+-----------+----------+--------------+  PFV      Full                                                        +---------+---------------+---------+-----------+----------+--------------+ POP      Full           Yes      Yes                                 +---------+---------------+---------+-----------+----------+--------------+ PTV      Full                                                        +---------+---------------+---------+-----------+----------+--------------+ PERO     Full                                                        +---------+---------------+---------+-----------+----------+--------------+    Summary: RIGHT: - There is no evidence of deep vein thrombosis in the lower extremity.  - No cystic structure found in the popliteal fossa.  LEFT: - There is no evidence of deep vein thrombosis in the lower extremity.  - No cystic structure found in the popliteal fossa.  *See table(s) above for measurements and observations.    Preliminary    US Abdomen Limited RUQ (LIVER/GB)  Result Date: 05/03/2022 CLINICAL DATA:  Epigastric pain for 2-3 days EXAM: ULTRASOUND ABDOMEN LIMITED RIGHT UPPER QUADRANT COMPARISON:  CT abdomen/pelvis 05/03/2022 FINDINGS: Gallbladder: Distended gallbladder with multiple gallstones. No significant gallbladder wall thickening. Largest gallstone measures 7 mm. No pericholecystic fluid. Negative sonographic Murphy sign. Common bile duct: Diameter: 7.4 mm Liver: No focal lesion identified. Within normal limits in parenchymal echogenicity. Portal vein is patent on color Doppler imaging with normal direction of blood flow towards the liver. Other: None. IMPRESSION: 1. Cholelithiasis without sonographic evidence of acute cholecystitis. 2. Hydropic gallbladder. If there is clinical concern regarding biliary dyskinesia, recommend a HIDA scan with ejection fraction. Electronically Signed   By: Kathreen Devoid M.D.   On:  05/03/2022 14:51   CT Angio Chest PE W and/or Wo Contrast  Result Date: 05/03/2022 CLINICAL DATA:  A 88 year old female presents for evaluation of a 88 year old female presents for evaluation of chest pain and vomiting. EXAM: CT ANGIOGRAPHY CHEST WITH CONTRAST TECHNIQUE: Multidetector CT imaging of the chest was performed using the standard protocol during bolus administration of intravenous contrast. Multiplanar CT image reconstructions and MIPs were obtained to evaluate the vascular anatomy. RADIATION DOSE REDUCTION: This exam was performed according to the departmental dose-optimization program which includes automated exposure control, adjustment of the mA and/or kV according to patient size and/or use of iterative reconstruction technique. CONTRAST:  29m OMNIPAQUE IOHEXOL 350 MG/ML SOLN COMPARISON:  CT of the abdomen and pelvis of May 03, 2022. FINDINGS: Cardiovascular: Calcified and noncalcified aortic atherosclerotic plaque. No gross acute aortic process. Three-vessel branching pattern in the chest. Limited assessment due to bolus timing on this study which  is optimized for evaluation of pulmonary vascular bed. Ascending thoracic aorta is nondilated. Central pulmonary vasculature is of normal caliber with density of 418 Hounsfield units, study quality is good. The study is negative for pulmonary embolism. Mediastinum/Nodes: Marked thickening of the esophagus with irregular thickening of the mid esophagus and mural stratification of the distal esophagus. Small hiatal hernia. Material within the esophagus. Infiltration of the fat about the esophagus. No signs of gas in the mediastinum. Small LEFT paratracheal lymph node (image 53/4) 7 mm. This is at the area of greatest esophageal irregularity and thickening. No subcarinal adenopathy. No hilar adenopathy. No thoracic inlet lymphadenopathy. No axillary lymphadenopathy. Lungs/Pleura: Areas of ground-glass. Bronchial wall thickening. Basilar airspace disease  and small bilateral pleural effusions. Material in LEFT lower lobe bronchi. Upper Abdomen: Small hiatal hernia as discussed. Large RIGHT renal cyst for which no additional dedicated imaging follow-up is recommended, partially evaluated better seen on the patient's abdominal CT images, see report for this for further detail. The upper abdomen shows no acute findings to the extent evaluated. Musculoskeletal: Osteopenia.  Spinal degenerative changes. 40-50% loss of height of the C7 level is of uncertain chronicity. Wedging of anterior superior endplates of E36-O29 associated with Schmorl's nodes, favored to be chronic. Mild loss of height also of the T2 vertebral body, of uncertain chronicity. Review of the MIP images confirms the above findings. IMPRESSION: 1. Negative for pulmonary embolism. 2. Irregular marked thickening of the esophagus extending from mid through distal esophagus with nodular appearance particularly in the mid esophagus raising the question of esophageal neoplasm versus severe esophagitis. 3. Pulmonary parenchymal ground-glass, filling defects in LEFT lower lobe bronchi and bronchial wall thickening raising the question of aspiration. 4. Small hiatal hernia. 5. Small bilateral pleural effusions associated with above findings without signs of pleural enhancement and with bibasilar airspace disease. 6. Aortic atherosclerosis. Aortic Atherosclerosis (ICD10-I70.0). Electronically Signed   By: Zetta Bills M.D.   On: 05/03/2022 13:25   CT ABDOMEN PELVIS W CONTRAST  Result Date: 05/03/2022 CLINICAL DATA:  Left upper quadrant abdominal pain EXAM: CT ABDOMEN AND PELVIS WITH CONTRAST TECHNIQUE: Multidetector CT imaging of the abdomen and pelvis was performed using the standard protocol following bolus administration of intravenous contrast. RADIATION DOSE REDUCTION: This exam was performed according to the departmental dose-optimization program which includes automated exposure control, adjustment of  the mA and/or kV according to patient size and/or use of iterative reconstruction technique. CONTRAST:  5m OMNIPAQUE IOHEXOL 350 MG/ML SOLN COMPARISON:  CT scan of the pelvis 04/02/2022 FINDINGS: Lower chest: See concurrently obtained but separately dictated CT scan of the chest. Hepatobiliary: Normal hepatic contour and morphology. No discrete hepatic lesion. Marked dilation of the gallbladder. Small stones present in the gallbladder neck. No intra or extrahepatic biliary ductal dilatation. Pancreas: Unremarkable. No pancreatic ductal dilatation or surrounding inflammatory changes. Spleen: Normal in size without focal abnormality. Adrenals/Urinary Tract: Nonspecific 1.1 cm left adrenal nodule. The right adrenal gland is normal. Large 9 cm cyst exophytic from the upper pole of the right kidney. Smaller 3.1 cm simple cyst exophytic from the anterior interpolar right kidney. Approximately 1.5 cm simple cyst exophytic from the interpolar left kidney. Numerous additional small low-attenuation lesions present bilaterally all of which are too small to characterize but statistically likely benign cysts. No enhancing mass, hydronephrosis or nephrolithiasis. Ureters and bladder are unremarkable. Stomach/Bowel: Stomach is within normal limits. Appendix appears normal. No evidence of bowel wall thickening, distention, or inflammatory changes. Vascular/Lymphatic: Scattered atherosclerotic vascular calcifications. No evidence  of aneurysm. No suspicious lymphadenopathy. No focal venous occlusion. Reproductive: Status post hysterectomy. No adnexal masses. Other: No abdominal wall hernia or abnormality. No abdominopelvic ascites. Musculoskeletal: No acute fracture or malalignment. Mild grade 1 anterolisthesis of L4 on L5. Mild grade 1 anterolisthesis of L5 on S1. Chronic appearing superior endplate compression fractures at T10 and T11. The T11 deformity may simply represent a large Schmorl's node. IMPRESSION: 1. Cholelithiasis  with marked distension of the gallbladder. Findings could represent prolonged NPO versus cholecystitis. Of note, there is no significant gallbladder wall thickening or evidence of pericholecystic fluid. 2. Bilateral simple renal cysts.  No further follow-up recommended. 3. Nonspecific 1.1 cm left adrenal nodule. Statistically, this is likely a benign adenoma. Given advanced age, no further follow-up is recommended. 4. Chronic appearing superior endplate compression fractures at T10 and T11. 5. Mild grade 1 anterolisthesis of L4 on L5 and L5 on S1. 6.  Aortic Atherosclerosis (ICD10-I70.0). Electronically Signed   By: Jacqulynn Cadet M.D.   On: 05/03/2022 11:33   DG Chest Port 1 View  Result Date: 05/03/2022 CLINICAL DATA:  Chest pain EXAM: PORTABLE CHEST 1 VIEW COMPARISON:  None Available. FINDINGS: Patient is rotated to the right. Cardiac and mediastinal contours are within normal limits when accounting for patient rotation. Mild bibasilar opacities, likely due to atelectasis. No focal consolidation. Blunting of the bilateral costophrenic angles. No evidence of pneumothorax. IMPRESSION: 1. Mild bibasilar atelectasis. 2. Blunting of the bilateral costophrenic angles, possibly due to pleural thickening or trace bilateral pleural effusions. Electronically Signed   By: Yetta Glassman M.D.   On: 05/03/2022 10:09       The results of significant diagnostics from this hospitalization (including imaging, microbiology, ancillary and laboratory) are listed below for reference.     Microbiology: Recent Results (from the past 240 hour(s))  Microscopic Examination     Status: Abnormal   Collection Time: 05/01/22  2:40 PM   Urine  Result Value Ref Range Status   WBC, UA 0-5 0 - 5 /hpf Final   RBC, Urine 0-2 0 - 2 /hpf Final   Epithelial Cells (non renal) 0-10 0 - 10 /hpf Final   Crystals Present (A) N/A Final   Crystal Type Amorphous Sediment N/A Final   Bacteria, UA None seen None seen/Few Final      Labs:  CBC: Recent Labs  Lab 05/03/22 0944 05/04/22 0707  WBC 11.0* 6.2  NEUTROABS 8.0* 2.6  HGB 11.7* 10.1*  HCT 36.1 30.6*  MCV 89.6 90.8  PLT 237 228   BMP &GFR Recent Labs  Lab 05/03/22 0944 05/04/22 0707  NA 141 142  K 3.3* 3.9  CL 100 110  CO2 30 25  GLUCOSE 117* 81  BUN 23 22  CREATININE 0.85 0.81  CALCIUM 12.0* 9.5  MG  --  1.7  PHOS  --  2.9   Estimated Creatinine Clearance: 35.6 mL/min (by C-G formula based on SCr of 0.81 mg/dL). Liver & Pancreas: Recent Labs  Lab 05/03/22 0944 05/04/22 0707  AST 12* 11*  ALT 7 8  ALKPHOS 69 58  BILITOT 0.9 0.7  PROT 6.7 5.2*  ALBUMIN 4.3 3.1*   Recent Labs  Lab 05/03/22 0944  LIPASE 23   No results for input(s): "AMMONIA" in the last 168 hours. Diabetic: No results for input(s): "HGBA1C" in the last 72 hours. No results for input(s): "GLUCAP" in the last 168 hours. Cardiac Enzymes: No results for input(s): "CKTOTAL", "CKMB", "CKMBINDEX", "TROPONINI" in the last 168 hours.  No results for input(s): "PROBNP" in the last 8760 hours. Coagulation Profile: No results for input(s): "INR", "PROTIME" in the last 168 hours. Thyroid Function Tests: No results for input(s): "TSH", "T4TOTAL", "FREET4", "T3FREE", "THYROIDAB" in the last 72 hours. Lipid Profile: No results for input(s): "CHOL", "HDL", "LDLCALC", "TRIG", "CHOLHDL", "LDLDIRECT" in the last 72 hours. Anemia Panel: No results for input(s): "VITAMINB12", "FOLATE", "FERRITIN", "TIBC", "IRON", "RETICCTPCT" in the last 72 hours. Urine analysis:    Component Value Date/Time   COLORURINE YELLOW 04/18/2022 1409   APPEARANCEUR Hazy (A) 05/01/2022 1440   LABSPEC 1.025 04/18/2022 1409   PHURINE < OR = 5.0 04/18/2022 1409   GLUCOSEU Negative 05/01/2022 1440   GLUCOSEU NEGATIVE 04/11/2022 1228   HGBUR NEGATIVE 04/18/2022 1409   BILIRUBINUR Negative 05/01/2022 1440   KETONESUR NEGATIVE 04/18/2022 1409   PROTEINUR Trace (A) 05/01/2022 1440   PROTEINUR NEGATIVE  04/18/2022 1409   UROBILINOGEN 0.2 04/11/2022 1228   NITRITE Negative 05/01/2022 1440   NITRITE NEGATIVE 04/18/2022 1409   LEUKOCYTESUR Negative 05/01/2022 1440   LEUKOCYTESUR NEGATIVE 04/18/2022 1409   Sepsis Labs: Invalid input(s): "PROCALCITONIN", "LACTICIDVEN"   SIGNED:  Mercy Riding, MD  [facility] 05/04/2022, 2:58 PM

## 2022-05-05 DIAGNOSIS — K219 Gastro-esophageal reflux disease without esophagitis: Secondary | ICD-10-CM | POA: Diagnosis not present

## 2022-05-05 DIAGNOSIS — E7800 Pure hypercholesterolemia, unspecified: Secondary | ICD-10-CM | POA: Diagnosis not present

## 2022-05-05 DIAGNOSIS — F0390 Unspecified dementia without behavioral disturbance: Secondary | ICD-10-CM | POA: Diagnosis not present

## 2022-05-05 DIAGNOSIS — Z85828 Personal history of other malignant neoplasm of skin: Secondary | ICD-10-CM | POA: Diagnosis not present

## 2022-05-05 DIAGNOSIS — D649 Anemia, unspecified: Secondary | ICD-10-CM | POA: Diagnosis not present

## 2022-05-05 DIAGNOSIS — N390 Urinary tract infection, site not specified: Secondary | ICD-10-CM | POA: Diagnosis not present

## 2022-05-05 DIAGNOSIS — F32A Depression, unspecified: Secondary | ICD-10-CM | POA: Diagnosis not present

## 2022-05-05 DIAGNOSIS — F419 Anxiety disorder, unspecified: Secondary | ICD-10-CM | POA: Diagnosis not present

## 2022-05-05 DIAGNOSIS — H9190 Unspecified hearing loss, unspecified ear: Secondary | ICD-10-CM | POA: Diagnosis not present

## 2022-05-05 DIAGNOSIS — M810 Age-related osteoporosis without current pathological fracture: Secondary | ICD-10-CM | POA: Diagnosis not present

## 2022-05-05 DIAGNOSIS — S32511D Fracture of superior rim of right pubis, subsequent encounter for fracture with routine healing: Secondary | ICD-10-CM | POA: Diagnosis not present

## 2022-05-05 DIAGNOSIS — Z66 Do not resuscitate: Secondary | ICD-10-CM | POA: Diagnosis not present

## 2022-05-05 DIAGNOSIS — E213 Hyperparathyroidism, unspecified: Secondary | ICD-10-CM | POA: Diagnosis not present

## 2022-05-05 DIAGNOSIS — S32591D Other specified fracture of right pubis, subsequent encounter for fracture with routine healing: Secondary | ICD-10-CM | POA: Diagnosis not present

## 2022-05-05 DIAGNOSIS — Z9181 History of falling: Secondary | ICD-10-CM | POA: Diagnosis not present

## 2022-05-05 DIAGNOSIS — Z9089 Acquired absence of other organs: Secondary | ICD-10-CM | POA: Diagnosis not present

## 2022-05-05 DIAGNOSIS — M1612 Unilateral primary osteoarthritis, left hip: Secondary | ICD-10-CM | POA: Diagnosis not present

## 2022-05-06 ENCOUNTER — Telehealth: Payer: Self-pay

## 2022-05-06 DIAGNOSIS — S32591D Other specified fracture of right pubis, subsequent encounter for fracture with routine healing: Secondary | ICD-10-CM | POA: Diagnosis not present

## 2022-05-06 DIAGNOSIS — F0390 Unspecified dementia without behavioral disturbance: Secondary | ICD-10-CM | POA: Diagnosis not present

## 2022-05-06 DIAGNOSIS — F419 Anxiety disorder, unspecified: Secondary | ICD-10-CM | POA: Diagnosis not present

## 2022-05-06 DIAGNOSIS — N390 Urinary tract infection, site not specified: Secondary | ICD-10-CM | POA: Diagnosis not present

## 2022-05-06 DIAGNOSIS — D649 Anemia, unspecified: Secondary | ICD-10-CM | POA: Diagnosis not present

## 2022-05-06 DIAGNOSIS — S32511D Fracture of superior rim of right pubis, subsequent encounter for fracture with routine healing: Secondary | ICD-10-CM | POA: Diagnosis not present

## 2022-05-06 DIAGNOSIS — M1712 Unilateral primary osteoarthritis, left knee: Secondary | ICD-10-CM | POA: Diagnosis not present

## 2022-05-06 NOTE — Telephone Encounter (Signed)
Transition Care Management Unsuccessful Follow-up Telephone Call  Date of discharge and from where:  05/04/22 [facility]  Attempts:  1st Attempt  Reason for unsuccessful TCM follow-up call:  Unable to reach patient. Will follow

## 2022-05-08 NOTE — Telephone Encounter (Signed)
Can schedule at 8:00 on 05/13/22 if no one scheduled.

## 2022-05-08 NOTE — Telephone Encounter (Signed)
Transition Care Management Follow-up Telephone Call Date of discharge and from where: 05/04/22 [facility] How have you been since you were released from the hospital? Information received from daughter, HIPAA compliant. Taking protonix as directed for acid reflux and  doing very well.  Eating lightly, staying hydrated. Denies n/v/d, chest pain, abdominal pain, headaches, dizziness,  extreme fatigue and all other harmful symptoms.  Any questions or concerns? No  Items Reviewed: Did the pt receive and understand the discharge instructions provided? Yes  Medications obtained and verified? Yes  Any new allergies since your discharge? No  Dietary orders reviewed? Yes, soft diet, small portions.  Do you have support at home? Yes   Home Care and Equipment/Supplies: Were home health services ordered? No  Functional Questionnaire: (I = Independent and D = Dependent) ADLs: Daughter assist  Eating- I  Maintaining continence- Urinary frequency since starting Gemtesa '75mg'$ . Urine clear, no odor. Daughter notes spokes with physician and is aware this could be a side effect of Gemtesa and may take a little while for her body to acclimate.   Transferring/Ambulation- walker  Managing Meds- daughter assist  Follow up appointments reviewed:  PCP Hospital f/u appt confirmed? Yes  Scheduled to see PCP 05/24/22 @ 11:00. First available opening for hospital follow up. Patient okay to schedule sooner, as directed. Nurse visit B12 injection scheduled 05/13/22 @ 2:30.  [facility] f/u appt confirmed?  N/a Are transportation arrangements needed? No  If their condition worsens, is the pt aware to call PCP or go to the Emergency Dept.? Yes Was the patient provided with contact information for the PCP's office or ED? Yes Was to pt encouraged to call back with questions or concerns?  Yes

## 2022-05-09 DIAGNOSIS — N3000 Acute cystitis without hematuria: Secondary | ICD-10-CM | POA: Diagnosis not present

## 2022-05-09 DIAGNOSIS — N3091 Cystitis, unspecified with hematuria: Secondary | ICD-10-CM | POA: Diagnosis not present

## 2022-05-09 NOTE — Telephone Encounter (Signed)
S/w [name] - pt sched for 8am 7/10

## 2022-05-13 ENCOUNTER — Other Ambulatory Visit: Payer: Self-pay

## 2022-05-13 ENCOUNTER — Ambulatory Visit: Payer: Medicare Other

## 2022-05-13 ENCOUNTER — Encounter: Payer: Self-pay | Admitting: Internal Medicine

## 2022-05-13 ENCOUNTER — Ambulatory Visit (INDEPENDENT_AMBULATORY_CARE_PROVIDER_SITE_OTHER): Payer: Medicare Other | Admitting: Internal Medicine

## 2022-05-13 VITALS — BP 124/70 | HR 64 | Temp 98.1°F | Resp 17 | Ht 60.0 in | Wt 122.8 lb

## 2022-05-13 DIAGNOSIS — D500 Iron deficiency anemia secondary to blood loss (chronic): Secondary | ICD-10-CM

## 2022-05-13 DIAGNOSIS — M25561 Pain in right knee: Secondary | ICD-10-CM | POA: Diagnosis not present

## 2022-05-13 DIAGNOSIS — K209 Esophagitis, unspecified without bleeding: Secondary | ICD-10-CM | POA: Diagnosis not present

## 2022-05-13 DIAGNOSIS — R1319 Other dysphagia: Secondary | ICD-10-CM | POA: Diagnosis not present

## 2022-05-13 DIAGNOSIS — R634 Abnormal weight loss: Secondary | ICD-10-CM

## 2022-05-13 DIAGNOSIS — R350 Frequency of micturition: Secondary | ICD-10-CM

## 2022-05-13 DIAGNOSIS — R7989 Other specified abnormal findings of blood chemistry: Secondary | ICD-10-CM

## 2022-05-13 DIAGNOSIS — R300 Dysuria: Secondary | ICD-10-CM | POA: Diagnosis not present

## 2022-05-13 DIAGNOSIS — Z7189 Other specified counseling: Secondary | ICD-10-CM | POA: Diagnosis not present

## 2022-05-13 DIAGNOSIS — E7800 Pure hypercholesterolemia, unspecified: Secondary | ICD-10-CM | POA: Diagnosis not present

## 2022-05-13 DIAGNOSIS — M25562 Pain in left knee: Secondary | ICD-10-CM

## 2022-05-13 DIAGNOSIS — E538 Deficiency of other specified B group vitamins: Secondary | ICD-10-CM

## 2022-05-13 DIAGNOSIS — F0390 Unspecified dementia without behavioral disturbance: Secondary | ICD-10-CM

## 2022-05-13 LAB — COMPREHENSIVE METABOLIC PANEL
ALT: 6 U/L (ref 0–35)
AST: 12 U/L (ref 0–37)
Albumin: 4.4 g/dL (ref 3.5–5.2)
Alkaline Phosphatase: 78 U/L (ref 39–117)
BUN: 10 mg/dL (ref 6–23)
CO2: 28 mEq/L (ref 19–32)
Calcium: 9.8 mg/dL (ref 8.4–10.5)
Chloride: 103 mEq/L (ref 96–112)
Creatinine, Ser: 0.76 mg/dL (ref 0.40–1.20)
GFR: 68.46 mL/min (ref >60.00)
Glucose, Bld: 97 mg/dL (ref 70–99)
Potassium: 3.7 mEq/L (ref 3.5–5.1)
Sodium: 139 mEq/L (ref 135–145)
Total Bilirubin: 0.6 mg/dL (ref 0.2–1.2)
Total Protein: 6 g/dL (ref 6.0–8.3)

## 2022-05-13 LAB — CBC WITH DIFFERENTIAL/PLATELET
Basophils Absolute: 0.1 10*3/uL (ref 0.0–0.1)
Basophils Relative: 1.9 % (ref 0.0–3.0)
Eosinophils Absolute: 0.2 10*3/uL (ref 0.0–0.7)
Eosinophils Relative: 3.7 % (ref 0.0–5.0)
HCT: 34.8 % — ABNORMAL LOW (ref 36.0–46.0)
Hemoglobin: 11.4 g/dL — ABNORMAL LOW (ref 12.0–15.0)
Lymphocytes Relative: 35.1 % (ref 12.0–46.0)
Lymphs Abs: 1.8 10*3/uL (ref 0.7–4.0)
MCHC: 32.7 g/dL (ref 30.0–36.0)
MCV: 89.7 fl (ref 78.0–100.0)
Monocytes Absolute: 0.5 10*3/uL (ref 0.1–1.0)
Monocytes Relative: 8.6 % (ref 3.0–12.0)
Neutro Abs: 2.7 10*3/uL (ref 1.4–7.7)
Neutrophils Relative %: 50.7 % (ref 43.0–77.0)
Platelets: 333 10*3/uL (ref 150.0–400.0)
RBC: 3.88 Mil/uL (ref 3.87–5.11)
RDW: 16 % — ABNORMAL HIGH (ref 11.5–15.5)
WBC: 5.3 10*3/uL (ref 4.0–10.5)

## 2022-05-13 MED ORDER — CYANOCOBALAMIN 1000 MCG/ML IJ SOLN: 1000.0000 ug | Freq: Once | INTRAMUSCULAR | Status: DC

## 2022-05-13 MED ORDER — PANTOPRAZOLE SODIUM 40 MG PO TBEC: 40.0000 mg | DELAYED_RELEASE_TABLET | Freq: Every day | ORAL | 1 refills | Status: AC

## 2022-05-13 MED ORDER — NYSTATIN 100000 UNIT/GM EX CREA: 1.0000 | TOPICAL_CREAM | Freq: Two times a day (BID) | CUTANEOUS | 0 refills | Status: DC

## 2022-05-13 NOTE — Progress Notes (Unsigned)
Patient ID: LARONICA BHAGAT, female   DOB: 1929/05/03, 88 y.o.   MRN: [id number]   Subjective:    Patient ID: [name], female    DOB: [birth year], 88 y.o.   MRN: [id number]  This visit occurred during the SARS-CoV-2 public health emergency.  Safety protocols were in place, including screening questions prior to the visit, additional usage of staff PPE, and extensive cleaning of exam room while observing appropriate contact time as indicated for disinfecting solutions.   Patient here for No chief complaint on file.  Marland Kitchen   HPI    Past Medical History:  Diagnosis Date   Anemia    Anxiety    Arthritis    "left hip" (08/15/2016)   Basal cell carcinoma    forehead & nose; [facility]; deep cutting   Depression    GERD (gastroesophageal reflux disease)    Hepatitis    "think I had it as a child"   History of kidney stones    HOH (hard of hearing)    Hypercholesterolemia    Osteoporosis    Pyelonephritis, acute     s/p ureteral stent   Vasomotor rhinitis    chronic   Past Surgical History:  Procedure Laterality Date   ABDOMINAL HYSTERECTOMY  1968   secondary to prolapse, ovaries not removed   BASAL CELL CARCINOMA EXCISION     forehead & nose; [place] cut them off; deep cutting   CATARACT EXTRACTION, BILATERAL Bilateral    COLONOSCOPY     DILATION AND CURETTAGE OF UTERUS     S/P miscarriage   LITHOTRIPSY     Dr Yves Dill, nephrolithiasis   PARATHYROIDECTOMY Left 08/15/2016    inferior minimally invasive /notes 08/15/2016   PARATHYROIDECTOMY Left 08/15/2016   Procedure: LEFT INFERIOR PARATHYROIDECTOMY;  Surgeon: Armandina Gemma, MD;  Location: [place];  Service: General;  Laterality: Left;   RETINAL DETACHMENT SURGERY     URETERAL STENT PLACEMENT     Family History  Problem Relation Age of Onset   Skin cancer Father    Prostate cancer Father    Colon cancer Brother    Prostate cancer Brother    Hypertension Sister    Skin cancer Sister    Diabetes Mellitus II  Sister    Breast cancer Sister 7   Breast cancer Other        niece   Social History   Socioeconomic History   Marital status: Widowed    Spouse name: Not on file   Number of children: 4   Years of education: Not on file   Highest education level: Not on file  Occupational History   Not on file  Tobacco Use   Smoking status: Never   Smokeless tobacco: Never  Substance and Sexual Activity   Alcohol use: No    Alcohol/week: 0.0 standard drinks of alcohol   Drug use: No   Sexual activity: Never  Other Topics Concern   Not on file  Social History Narrative   Not on file   Social Determinants of Health   Financial Resource Strain: Low Risk  (02/28/2022)   Overall Financial Resource Strain (CARDIA)    Difficulty of Paying Living Expenses: Not hard at all  Food Insecurity: No Food Insecurity (02/28/2022)   Hunger Vital Sign    Worried About Running Out of Food in the Last Year: Never true    Ran Out of Food in the Last Year: Never true  Transportation Needs: No Transportation  Needs (02/28/2022)   PRAPARE - Hydrologist (Medical): No    Lack of Transportation (Non-Medical): No  Physical Activity: Not on file  Stress: No Stress Concern Present (02/28/2022)   [place]    Feeling of Stress : Not at all  Social Connections: Unknown (02/28/2022)   Social Connection and Isolation Panel [NHANES]    Frequency of Communication with Friends and Family: Not on file    Frequency of Social Gatherings with Friends and Family: More than three times a week    Attends Religious Services: Not on Advertising copywriter or Organizations: Not on file    Attends Music therapist: Not on file    Marital Status: Not on file     Review of Systems     Objective:     BP 124/70 (BP Location: Right Arm, Patient Position: Sitting, Cuff Size: Small)   Pulse 64   Temp 98.1 F (36.7  C) (Temporal)   Resp 17   Ht 5' (1.524 m)   Wt 122 lb 12.5 oz (55.7 kg)   SpO2 97%   BMI 23.98 kg/m  Wt Readings from Last 3 Encounters:  05/13/22 122 lb 12.5 oz (55.7 kg)  05/03/22 124 lb 1.9 oz (56.3 kg)  05/01/22 133 lb (60.3 kg)    Physical Exam   Outpatient Encounter Medications as of 05/13/2022  Medication Sig   acetaminophen (TYLENOL) 500 MG tablet Take 1,000 mg by mouth 3 (three) times daily.   CALCIUM CARBONATE-VITAMIN D PO Take 1 tablet by mouth 2 (two) times daily.   Cholecalciferol (VITAMIN D-3) 1000 UNITS CAPS Take 1,000 Units by mouth daily.    CRANBERRY PO Take 1 tablet by mouth daily.   cyanocobalamin (,VITAMIN B-12,) 1000 MCG/ML injection Inject 1,000 mcg into the muscle every 30 (thirty) days.   feeding supplement (ENSURE ENLIVE / ENSURE PLUS) LIQD Take 237 mLs by mouth 2 (two) times daily between meals.   Multiple Vitamins-Iron (MULTIVITAMINS WITH IRON) TABS tablet Take 1 tablet by mouth daily.   nystatin cream (MYCOSTATIN) Apply 1 Application topically 2 (two) times daily.   Omega-3 Fatty Acids (FISH OIL) 1200 MG CAPS Take 1,200 mg by mouth daily.   Probiotic Product (PROBIOTIC PO) Take 1 tablet by mouth daily.   Propylene Glycol-Glycerin (SOOTHE OP) Place 1 drop into both eyes daily as needed (dry eyes).   simvastatin (ZOCOR) 40 MG tablet Take 1 tablet (40 mg total) by mouth every evening.   traMADol (ULTRAM) 50 MG tablet 1/2 tablet q hs prn and one tablet q day prn with physical therapy. (Patient taking differently: Take 25-50 mg by mouth 2 (two) times daily as needed for moderate pain. with physical therapy.)   Vibegron (GEMTESA) 75 MG TABS Take 75 mg by mouth daily.   [DISCONTINUED] pantoprazole (PROTONIX) 40 MG tablet Take 1 tablet (40 mg total) by mouth daily.   pantoprazole (PROTONIX) 40 MG tablet Take 1 tablet (40 mg total) by mouth daily.   [DISCONTINUED] ipratropium (ATROVENT) 0.03 % nasal spray Place 1 spray into both nostrils every 12 (twelve) hours.  (Patient not taking: Reported on 05/03/2022)   [DISCONTINUED] trospium (SANCTURA) 20 MG tablet Take 1 tablet (20 mg total) by mouth at bedtime. (Patient not taking: Reported on 05/03/2022)   Facility-Administered Encounter Medications as of 05/13/2022  Medication   cyanocobalamin ((VITAMIN B-12)) injection 1,000 mcg     Lab Results  Component Value Date   WBC 6.2 05/04/2022   HGB 10.1 (L) 05/04/2022   HCT 30.6 (L) 05/04/2022   PLT 228 05/04/2022   GLUCOSE 81 05/04/2022   CHOL 164 02/28/2022   TRIG 105.0 02/28/2022   HDL 57.90 02/28/2022   LDLDIRECT 78.0 04/30/2018   LDLCALC 85 02/28/2022   ALT 8 05/04/2022   AST 11 (L) 05/04/2022   NA 142 05/04/2022   K 3.9 05/04/2022   CL 110 05/04/2022   CREATININE 0.81 05/04/2022   BUN 22 05/04/2022   CO2 25 05/04/2022   TSH 3.61 02/28/2022    VAS [place] LOWER EXTREMITY VENOUS (DVT)  Result Date: 05/05/2022  Lower Venous DVT Study Patient Name:  MINDY GALI  Date of Exam:   05/04/2022 Medical Rec #: [id number]   Accession #:    [id number] Date of Birth: 1929/07/19    Patient Gender: F Patient Age:   10 years Exam Location:  [facility] Procedure:      VAS [place] LOWER EXTREMITY VENOUS (DVT) Referring Phys: Bretta Bang GONFA --------------------------------------------------------------------------------  Indications: Elevated Ddimer.  Risk Factors: None identified. Limitations: Poor ultrasound/tissue interface and patient positioning, patient pain tolerance. Comparison Study: No prior studies. Performing Technologist: Oliver Hum RVT  Examination Guidelines: A complete evaluation includes B-mode imaging, spectral Doppler, color Doppler, and power Doppler as needed of all accessible portions of each vessel. Bilateral testing is considered an integral part of a complete examination. Limited examinations for reoccurring indications may be performed as noted. The reflux portion of the exam is performed with the patient in reverse Trendelenburg.   +---------+---------------+---------+-----------+----------+--------------+ RIGHT    CompressibilityPhasicitySpontaneityPropertiesThrombus Aging +---------+---------------+---------+-----------+----------+--------------+ CFV      Full           Yes      Yes                                 +---------+---------------+---------+-----------+----------+--------------+ SFJ      Full                                                        +---------+---------------+---------+-----------+----------+--------------+ FV Prox  Full                                                        +---------+---------------+---------+-----------+----------+--------------+ FV Mid   Full                                                        +---------+---------------+---------+-----------+----------+--------------+ FV DistalFull                                                        +---------+---------------+---------+-----------+----------+--------------+ PFV      Full                                                        +---------+---------------+---------+-----------+----------+--------------+  POP      Full           Yes      Yes                                 +---------+---------------+---------+-----------+----------+--------------+ PTV      Full                                                        +---------+---------------+---------+-----------+----------+--------------+ PERO     Full                                                        +---------+---------------+---------+-----------+----------+--------------+   +---------+---------------+---------+-----------+----------+--------------+ LEFT     CompressibilityPhasicitySpontaneityPropertiesThrombus Aging +---------+---------------+---------+-----------+----------+--------------+ CFV      Full           Yes      Yes                                  +---------+---------------+---------+-----------+----------+--------------+ SFJ      Full                                                        +---------+---------------+---------+-----------+----------+--------------+ FV Prox  Full                                                        +---------+---------------+---------+-----------+----------+--------------+ FV Mid   Full                                                        +---------+---------------+---------+-----------+----------+--------------+ FV DistalFull                                                        +---------+---------------+---------+-----------+----------+--------------+ PFV      Full                                                        +---------+---------------+---------+-----------+----------+--------------+ POP      Full           Yes      Yes                                 +---------+---------------+---------+-----------+----------+--------------+  PTV      Full                                                        +---------+---------------+---------+-----------+----------+--------------+ PERO     Full                                                        +---------+---------------+---------+-----------+----------+--------------+     Summary: RIGHT: - There is no evidence of deep vein thrombosis in the lower extremity.  - No cystic structure found in the popliteal fossa.  LEFT: - There is no evidence of deep vein thrombosis in the lower extremity.  - No cystic structure found in the popliteal fossa.  *See table(s) above for measurements and observations. Electronically signed by Jamelle Haring on 05/05/2022 at 12:15:54 PM.    Final    US Abdomen Limited RUQ (LIVER/GB)  Result Date: 05/03/2022 CLINICAL DATA:  Epigastric pain for 2-3 days EXAM: ULTRASOUND ABDOMEN LIMITED RIGHT UPPER QUADRANT COMPARISON:  CT abdomen/pelvis 05/03/2022 FINDINGS: Gallbladder: Distended  gallbladder with multiple gallstones. No significant gallbladder wall thickening. Largest gallstone measures 7 mm. No pericholecystic fluid. Negative sonographic Murphy sign. Common bile duct: Diameter: 7.4 mm Liver: No focal lesion identified. Within normal limits in parenchymal echogenicity. Portal vein is patent on color Doppler imaging with normal direction of blood flow towards the liver. Other: None. IMPRESSION: 1. Cholelithiasis without sonographic evidence of acute cholecystitis. 2. Hydropic gallbladder. If there is clinical concern regarding biliary dyskinesia, recommend a HIDA scan with ejection fraction. Electronically Signed   By: Kathreen Devoid M.D.   On: 05/03/2022 14:51   CT Angio Chest PE W and/or Wo Contrast  Result Date: 05/03/2022 CLINICAL DATA:  A 88 year old female presents for evaluation of a 88 year old female presents for evaluation of chest pain and vomiting. EXAM: CT ANGIOGRAPHY CHEST WITH CONTRAST TECHNIQUE: Multidetector CT imaging of the chest was performed using the standard protocol during bolus administration of intravenous contrast. Multiplanar CT image reconstructions and MIPs were obtained to evaluate the vascular anatomy. RADIATION DOSE REDUCTION: This exam was performed according to the departmental dose-optimization program which includes automated exposure control, adjustment of the mA and/or kV according to patient size and/or use of iterative reconstruction technique. CONTRAST:  73m OMNIPAQUE IOHEXOL 350 MG/ML SOLN COMPARISON:  CT of the abdomen and pelvis of May 03, 2022. FINDINGS: Cardiovascular: Calcified and noncalcified aortic atherosclerotic plaque. No gross acute aortic process. Three-vessel branching pattern in the chest. Limited assessment due to bolus timing on this study which is optimized for evaluation of pulmonary vascular bed. Ascending thoracic aorta is nondilated. Central pulmonary vasculature is of normal caliber with density of 418 Hounsfield units,  study quality is good. The study is negative for pulmonary embolism. Mediastinum/Nodes: Marked thickening of the esophagus with irregular thickening of the mid esophagus and mural stratification of the distal esophagus. Small hiatal hernia. Material within the esophagus. Infiltration of the fat about the esophagus. No signs of gas in the mediastinum. Small LEFT paratracheal lymph node (image 53/4) 7 mm. This is at the area of greatest esophageal irregularity and thickening. No subcarinal adenopathy. No hilar adenopathy. No thoracic inlet lymphadenopathy.  No axillary lymphadenopathy. Lungs/Pleura: Areas of ground-glass. Bronchial wall thickening. Basilar airspace disease and small bilateral pleural effusions. Material in LEFT lower lobe bronchi. Upper Abdomen: Small hiatal hernia as discussed. Large RIGHT renal cyst for which no additional dedicated imaging follow-up is recommended, partially evaluated better seen on the patient's abdominal CT images, see report for this for further detail. The upper abdomen shows no acute findings to the extent evaluated. Musculoskeletal: Osteopenia.  Spinal degenerative changes. 40-50% loss of height of the C7 level is of uncertain chronicity. Wedging of anterior superior endplates of P38-S50 associated with Schmorl's nodes, favored to be chronic. Mild loss of height also of the T2 vertebral body, of uncertain chronicity. Review of the MIP images confirms the above findings. IMPRESSION: 1. Negative for pulmonary embolism. 2. Irregular marked thickening of the esophagus extending from mid through distal esophagus with nodular appearance particularly in the mid esophagus raising the question of esophageal neoplasm versus severe esophagitis. 3. Pulmonary parenchymal ground-glass, filling defects in LEFT lower lobe bronchi and bronchial wall thickening raising the question of aspiration. 4. Small hiatal hernia. 5. Small bilateral pleural effusions associated with above findings without  signs of pleural enhancement and with bibasilar airspace disease. 6. Aortic atherosclerosis. Aortic Atherosclerosis (ICD10-I70.0). Electronically Signed   By: Zetta Bills M.D.   On: 05/03/2022 13:25   CT ABDOMEN PELVIS W CONTRAST  Result Date: 05/03/2022 CLINICAL DATA:  Left upper quadrant abdominal pain EXAM: CT ABDOMEN AND PELVIS WITH CONTRAST TECHNIQUE: Multidetector CT imaging of the abdomen and pelvis was performed using the standard protocol following bolus administration of intravenous contrast. RADIATION DOSE REDUCTION: This exam was performed according to the departmental dose-optimization program which includes automated exposure control, adjustment of the mA and/or kV according to patient size and/or use of iterative reconstruction technique. CONTRAST:  25m OMNIPAQUE IOHEXOL 350 MG/ML SOLN COMPARISON:  CT scan of the pelvis 04/02/2022 FINDINGS: Lower chest: See concurrently obtained but separately dictated CT scan of the chest. Hepatobiliary: Normal hepatic contour and morphology. No discrete hepatic lesion. Marked dilation of the gallbladder. Small stones present in the gallbladder neck. No intra or extrahepatic biliary ductal dilatation. Pancreas: Unremarkable. No pancreatic ductal dilatation or surrounding inflammatory changes. Spleen: Normal in size without focal abnormality. Adrenals/Urinary Tract: Nonspecific 1.1 cm left adrenal nodule. The right adrenal gland is normal. Large 9 cm cyst exophytic from the upper pole of the right kidney. Smaller 3.1 cm simple cyst exophytic from the anterior interpolar right kidney. Approximately 1.5 cm simple cyst exophytic from the interpolar left kidney. Numerous additional small low-attenuation lesions present bilaterally all of which are too small to characterize but statistically likely benign cysts. No enhancing mass, hydronephrosis or nephrolithiasis. Ureters and bladder are unremarkable. Stomach/Bowel: Stomach is within normal limits. Appendix  appears normal. No evidence of bowel wall thickening, distention, or inflammatory changes. Vascular/Lymphatic: Scattered atherosclerotic vascular calcifications. No evidence of aneurysm. No suspicious lymphadenopathy. No focal venous occlusion. Reproductive: Status post hysterectomy. No adnexal masses. Other: No abdominal wall hernia or abnormality. No abdominopelvic ascites. Musculoskeletal: No acute fracture or malalignment. Mild grade 1 anterolisthesis of L4 on L5. Mild grade 1 anterolisthesis of L5 on S1. Chronic appearing superior endplate compression fractures at T10 and T11. The T11 deformity may simply represent a large Schmorl's node. IMPRESSION: 1. Cholelithiasis with marked distension of the gallbladder. Findings could represent prolonged NPO versus cholecystitis. Of note, there is no significant gallbladder wall thickening or evidence of pericholecystic fluid. 2. Bilateral simple renal cysts.  No further follow-up recommended.  3. Nonspecific 1.1 cm left adrenal nodule. Statistically, this is likely a benign adenoma. Given advanced age, no further follow-up is recommended. 4. Chronic appearing superior endplate compression fractures at T10 and T11. 5. Mild grade 1 anterolisthesis of L4 on L5 and L5 on S1. 6.  Aortic Atherosclerosis (ICD10-I70.0). Electronically Signed   By: Jacqulynn Cadet M.D.   On: 05/03/2022 11:33   DG Chest Port 1 View  Result Date: 05/03/2022 CLINICAL DATA:  Chest pain EXAM: PORTABLE CHEST 1 VIEW COMPARISON:  None Available. FINDINGS: Patient is rotated to the right. Cardiac and mediastinal contours are within normal limits when accounting for patient rotation. Mild bibasilar opacities, likely due to atelectasis. No focal consolidation. Blunting of the bilateral costophrenic angles. No evidence of pneumothorax. IMPRESSION: 1. Mild bibasilar atelectasis. 2. Blunting of the bilateral costophrenic angles, possibly due to pleural thickening or trace bilateral pleural effusions.  Electronically Signed   By: Yetta Glassman M.D.   On: 05/03/2022 10:09       Assessment & Plan:   Problem List Items Addressed This Visit     Esophagitis   Hypercalcemia   Relevant Orders   Comprehensive metabolic panel   Hypercholesterolemia   Relevant Orders   CBC with Differential/Platelet   Urinary frequency   Dementia without behavioral disturbance (HCC) (Chronic)   Other Visit Diagnoses     Dysuria    -  Primary   B12 deficiency       Relevant Medications   cyanocobalamin ((VITAMIN B-12)) injection 1,000 mcg (Start on 05/13/2022  9:15 AM)        Einar Pheasant, MD

## 2022-05-14 DIAGNOSIS — S32511D Fracture of superior rim of right pubis, subsequent encounter for fracture with routine healing: Secondary | ICD-10-CM | POA: Diagnosis not present

## 2022-05-14 DIAGNOSIS — F0390 Unspecified dementia without behavioral disturbance: Secondary | ICD-10-CM | POA: Diagnosis not present

## 2022-05-14 DIAGNOSIS — F419 Anxiety disorder, unspecified: Secondary | ICD-10-CM | POA: Diagnosis not present

## 2022-05-14 DIAGNOSIS — S32591D Other specified fracture of right pubis, subsequent encounter for fracture with routine healing: Secondary | ICD-10-CM | POA: Diagnosis not present

## 2022-05-14 DIAGNOSIS — D649 Anemia, unspecified: Secondary | ICD-10-CM | POA: Diagnosis not present

## 2022-05-14 DIAGNOSIS — N390 Urinary tract infection, site not specified: Secondary | ICD-10-CM | POA: Diagnosis not present

## 2022-05-14 LAB — URINE CULTURE
MICRO NUMBER:: 13625138
Result:: NO GROWTH
SPECIMEN QUALITY:: ADEQUATE

## 2022-05-15 ENCOUNTER — Encounter: Payer: Self-pay | Admitting: Internal Medicine

## 2022-05-15 DIAGNOSIS — M25569 Pain in unspecified knee: Secondary | ICD-10-CM | POA: Insufficient documentation

## 2022-05-15 DIAGNOSIS — R634 Abnormal weight loss: Secondary | ICD-10-CM | POA: Insufficient documentation

## 2022-05-15 NOTE — Assessment & Plan Note (Signed)
Knee pain as outlined.  S/p gel shots.  Completed.  Continue f/u with ortho.

## 2022-05-15 NOTE — Assessment & Plan Note (Signed)
Saw hematology.  Previous attempted iron infusion - had reaction.  She continues with multivitamin with iron.  Recent f/u.  Stable.  No further w/up. Follow cbc and iron studies.

## 2022-05-15 NOTE — Assessment & Plan Note (Signed)
Recently hospitalized with dysphagia.  Saw GI.while admitted) - CT noted thickening of esophagus.  Per GI:  Likely etiology is severe erosive esophagitis secondary to uncontrolled acid reflux but cannot exclude underlying neoplastic lesion. Opted to not proceed with EGD Will manage conservatively with PPI, pantoprazole 40 mg daily.  No pain reported.  Continue to encourage increased po intake.

## 2022-05-15 NOTE — Assessment & Plan Note (Signed)
Staying with daughter now.  Follow.

## 2022-05-15 NOTE — Assessment & Plan Note (Signed)
Elevated on initial presentation.  Resolved prior to discharge.  Follow.

## 2022-05-15 NOTE — Assessment & Plan Note (Signed)
Discussed with daughter and son today regarding the continued decline in Sabrina Cervantes's health.  Discussed her dementia. Also, she has had significant weight loss just in the last couple of weeks (11 pounds).  CT as outlined.  Cannot rule out esophageal neoplasm.  Given age and clinical health status, it was decided to hold on EGD.  Discussed hospice referral.  Agreeable.

## 2022-05-15 NOTE — Assessment & Plan Note (Signed)
Recent CT negative for PE.  Ultrasound negative for DVT.

## 2022-05-15 NOTE — Assessment & Plan Note (Signed)
Recent admission - CT noted thickening of esophagus.  Per GI:  Likely etiology is severe erosive esophagitis secondary to uncontrolled acid reflux but cannot exclude underlying neoplastic lesion. Opted to not proceed with EGD Will manage conservatively with PPI, pantoprazole 40 mg daily

## 2022-05-15 NOTE — Assessment & Plan Note (Signed)
Increased urinary frequency and nocturia as outlined. On gemtesa.  Seeing urology.  On macrobid.  Nystatin cream for vaginal irritation.  Trying to get urine culture results from [facility].

## 2022-05-15 NOTE — Assessment & Plan Note (Signed)
On simvastatin.  Follow lipid panel and liver function tests.   

## 2022-05-15 NOTE — Assessment & Plan Note (Signed)
11 pound weight loss in the last two weeks.  CT as outlined.  Elected to hold on EGD given her current medical condition.  Continue protonix.  Encourage increased po intake.  Follow.

## 2022-05-18 ENCOUNTER — Encounter (HOSPITAL_BASED_OUTPATIENT_CLINIC_OR_DEPARTMENT_OTHER): Payer: Self-pay | Admitting: Emergency Medicine

## 2022-05-18 ENCOUNTER — Emergency Department (HOSPITAL_BASED_OUTPATIENT_CLINIC_OR_DEPARTMENT_OTHER)
Admission: EM | Admit: 2022-05-18 | Discharge: 2022-05-19 | Disposition: A | Discharge status: Home / Self Care | Payer: Medicare Other | Attending: Emergency Medicine | Admitting: Emergency Medicine

## 2022-05-18 ENCOUNTER — Emergency Department (HOSPITAL_BASED_OUTPATIENT_CLINIC_OR_DEPARTMENT_OTHER): Payer: Medicare Other

## 2022-05-18 ENCOUNTER — Other Ambulatory Visit: Payer: Self-pay

## 2022-05-18 DIAGNOSIS — R109 Unspecified abdominal pain: Secondary | ICD-10-CM | POA: Diagnosis not present

## 2022-05-18 DIAGNOSIS — K8020 Calculus of gallbladder without cholecystitis without obstruction: Secondary | ICD-10-CM | POA: Diagnosis not present

## 2022-05-18 DIAGNOSIS — F0390 Unspecified dementia without behavioral disturbance: Secondary | ICD-10-CM | POA: Insufficient documentation

## 2022-05-18 DIAGNOSIS — R350 Frequency of micturition: Secondary | ICD-10-CM

## 2022-05-18 DIAGNOSIS — K5730 Diverticulosis of large intestine without perforation or abscess without bleeding: Secondary | ICD-10-CM | POA: Diagnosis not present

## 2022-05-18 DIAGNOSIS — N281 Cyst of kidney, acquired: Secondary | ICD-10-CM | POA: Diagnosis not present

## 2022-05-18 LAB — COMPREHENSIVE METABOLIC PANEL
ALT: <5 U/L (ref 0–44)
AST: 20 U/L (ref 15–41)
Albumin: 4.4 g/dL (ref 3.5–5.0)
Alkaline Phosphatase: 58 U/L (ref 38–126)
Anion gap: 10 (ref 5–15)
BUN: 12 mg/dL (ref 8–23)
CO2: 24 mmol/L (ref 22–32)
Calcium: 9.7 mg/dL (ref 8.9–10.3)
Chloride: 105 mmol/L (ref 98–111)
Creatinine, Ser: 0.73 mg/dL (ref 0.44–1.00)
GFR, Estimated: >60 mL/min (ref >60)
Glucose, Bld: 104 mg/dL — ABNORMAL HIGH (ref 70–99)
Potassium: 4.4 mmol/L (ref 3.5–5.1)
Sodium: 139 mmol/L (ref 135–145)
Total Bilirubin: 0.6 mg/dL (ref 0.3–1.2)
Total Protein: 6.6 g/dL (ref 6.5–8.1)

## 2022-05-18 LAB — URINALYSIS, ROUTINE W REFLEX MICROSCOPIC
Bilirubin Urine: NEGATIVE
Glucose, UA: NEGATIVE mg/dL
Hgb urine dipstick: NEGATIVE
Ketones, ur: NEGATIVE mg/dL
Leukocytes,Ua: NEGATIVE
Nitrite: NEGATIVE
Protein, ur: NEGATIVE mg/dL
Specific Gravity, Urine: 1.011 (ref 1.005–1.030)
pH: 6.5 (ref 5.0–8.0)

## 2022-05-18 LAB — CBC WITH DIFFERENTIAL/PLATELET
Abs Immature Granulocytes: 0.03 10*3/uL (ref 0.00–0.07)
Basophils Absolute: 0.1 10*3/uL (ref 0.0–0.1)
Basophils Relative: 1 %
Eosinophils Absolute: 0.3 10*3/uL (ref 0.0–0.5)
Eosinophils Relative: 2 %
HCT: 32.2 % — ABNORMAL LOW (ref 36.0–46.0)
Hemoglobin: 10.5 g/dL — ABNORMAL LOW (ref 12.0–15.0)
Immature Granulocytes: 0 %
Lymphocytes Relative: 17 %
Lymphs Abs: 1.8 10*3/uL (ref 0.7–4.0)
MCH: 29.7 pg (ref 26.0–34.0)
MCHC: 32.6 g/dL (ref 30.0–36.0)
MCV: 91 fL (ref 80.0–100.0)
Monocytes Absolute: 0.8 10*3/uL (ref 0.1–1.0)
Monocytes Relative: 7 %
Neutro Abs: 7.4 10*3/uL (ref 1.7–7.7)
Neutrophils Relative %: 73 %
Platelets: 308 10*3/uL (ref 150–400)
RBC: 3.54 MIL/uL — ABNORMAL LOW (ref 3.87–5.11)
RDW: 15.7 % — ABNORMAL HIGH (ref 11.5–15.5)
WBC: 10.2 10*3/uL (ref 4.0–10.5)
nRBC: 0 % (ref 0.0–0.2)

## 2022-05-18 LAB — LIPASE, BLOOD: Lipase: 14 U/L (ref 11–51)

## 2022-05-18 MED ORDER — ONDANSETRON HCL 4 MG PO TABS: 4.0000 mg | ORAL_TABLET | Freq: Four times a day (QID) | ORAL | 0 refills | Status: DC

## 2022-05-18 MED ORDER — ONDANSETRON HCL 4 MG/2ML IJ SOLN
4.0000 mg | Freq: Once | INTRAMUSCULAR | Status: AC
  Administered 2022-05-18: 4 mg via INTRAVENOUS
  Filled 2022-05-18: qty 2

## 2022-05-18 NOTE — ED Notes (Signed)
Patient refused to get out of chair and onto stretcher

## 2022-05-18 NOTE — ED Notes (Signed)
Purewick applied.

## 2022-05-18 NOTE — ED Notes (Signed)
Bedside commode in room for patient

## 2022-05-18 NOTE — ED Triage Notes (Signed)
Pt had pelvic fx 8 weeks ago, since then she has been on 7 antibiotics for UTI, she has urinary frequency, small amount of urine. Urologist involved and changed antibiotic. Pt has dementia

## 2022-05-18 NOTE — ED Provider Notes (Signed)
[facility] EMERGENCY DEPT Provider Note   CSN: [id number] Arrival date & time: 05/18/22  1846     History  Chief Complaint  Patient presents with   Urinary Frequency    Sabrina Cervantes is a 88 y.o. female.  Patient here with urinary frequency.  She has been having some ongoing urinary frequency since a pelvic injury several weeks ago.  She has been on multiple UTI medications.  She has been started on some medications by urology for this as well.  Denies any fevers or chills.  She is just frequently tried to go to the bathroom.  She has dementia history.  Denies any abdominal pain, back pain.  No other falls.  Nothing makes it worse or better.  The history is provided by a caregiver.       Home Medications Prior to Admission medications   Medication Sig Start Date End Date Taking? Authorizing Provider  ondansetron (ZOFRAN) 4 MG tablet Take 1 tablet (4 mg total) by mouth every 6 (six) hours. 05/18/22  Yes Gavina Dildine, DO  acetaminophen (TYLENOL) 500 MG tablet Take 1,000 mg by mouth 3 (three) times daily.    [provider]  CALCIUM CARBONATE-VITAMIN D PO Take 1 tablet by mouth 2 (two) times daily.    [provider]  Cholecalciferol (VITAMIN D-3) 1000 UNITS CAPS Take 1,000 Units by mouth daily.     [provider]  CRANBERRY PO Take 1 tablet by mouth daily.    [provider]  cyanocobalamin (,VITAMIN B-12,) 1000 MCG/ML injection Inject 1,000 mcg into the muscle every 30 (thirty) days.    [provider]  feeding supplement (ENSURE ENLIVE / ENSURE PLUS) LIQD Take 237 mLs by mouth 2 (two) times daily between meals. 05/04/22 06/03/22  Mercy Riding, MD  Multiple Vitamins-Iron (MULTIVITAMINS WITH IRON) TABS tablet Take 1 tablet by mouth daily.    [provider]  nystatin cream (MYCOSTATIN) Apply 1 Application topically 2 (two) times daily. 05/13/22   Einar Pheasant, MD  Omega-3 Fatty Acids (FISH OIL) 1200 MG CAPS Take  1,200 mg by mouth daily.    [provider]  pantoprazole (PROTONIX) 40 MG tablet Take 1 tablet (40 mg total) by mouth daily. 05/13/22 11/09/22  Einar Pheasant, MD  Probiotic Product (PROBIOTIC PO) Take 1 tablet by mouth daily.    [provider]  Propylene Glycol-Glycerin (SOOTHE OP) Place 1 drop into both eyes daily as needed (dry eyes).    [provider]  simvastatin (ZOCOR) 40 MG tablet Take 1 tablet (40 mg total) by mouth every evening. 07/30/21   Einar Pheasant, MD  traMADol (ULTRAM) 50 MG tablet 1/2 tablet q hs prn and one tablet q day prn with physical therapy. Patient taking differently: Take 25-50 mg by mouth 2 (two) times daily as needed for moderate pain. with physical therapy. 04/17/22   Einar Pheasant, MD  Vibegron (GEMTESA) 75 MG TABS Take 75 mg by mouth daily. 05/01/22   Stoioff, Ronda Fairly, MD      Allergies    Pyridium [phenazopyridine hcl]    Review of Systems   Review of Systems  Physical Exam Updated Vital Signs BP (!) 163/80   Pulse 89   Temp 98.1 F (36.7 C) (Oral)   Resp 18   SpO2 99%  Physical Exam Vitals and nursing note reviewed.  Constitutional:      General: She is not in acute distress.    Appearance: She is well-developed.  HENT:  Head: Normocephalic and atraumatic.  Eyes:     Conjunctiva/sclera: Conjunctivae normal.  Cardiovascular:     Rate and Rhythm: Normal rate and regular rhythm.     Pulses: Normal pulses.     Heart sounds: Normal heart sounds. No murmur heard. Pulmonary:     Effort: Pulmonary effort is normal. No respiratory distress.     Breath sounds: Normal breath sounds.  Abdominal:     Palpations: Abdomen is soft.     Tenderness: There is no abdominal tenderness.  Musculoskeletal:        General: No swelling.     Cervical back: Neck supple.  Skin:    General: Skin is warm and dry.     Capillary Refill: Capillary refill takes less than 2 seconds.  Neurological:     Mental Status: She is alert.   Psychiatric:        Mood and Affect: Mood normal.     ED Results / Procedures / Treatments   Labs (all labs ordered are listed, but only abnormal results are displayed) Labs Reviewed  CBC WITH DIFFERENTIAL/PLATELET - Abnormal; Notable for the following components:      Result Value   RBC 3.54 (*)    Hemoglobin 10.5 (*)    HCT 32.2 (*)    RDW 15.7 (*)    All other components within normal limits  COMPREHENSIVE METABOLIC PANEL - Abnormal; Notable for the following components:   Glucose, Bld 104 (*)    All other components within normal limits  URINE CULTURE  URINALYSIS, ROUTINE W REFLEX MICROSCOPIC  LIPASE, BLOOD    EKG None  Radiology No results found.  Procedures Procedures    Medications Ordered in ED Medications  ondansetron (ZOFRAN) injection 4 mg (4 mg Intravenous Given 05/18/22 2246)    ED Course/ Medical Decision Making/ A&P                           Medical Decision Making Amount and/or Complexity of Data Reviewed Labs: ordered. Radiology: ordered.  Risk Prescription drug management.   Sabrina Cervantes is here with urinary frequency.  History of dementia.  History of high cholesterol.  She is now been on some different medications now for urinary frequency issues.  She is been on some antibiotics.  She is following urology.  Bladder scan showed 60 cc of urine.  Overall she is not having retention.  We will attempt to check urine for infection today.  Overall suspect this is a chronic process.  She is not having any abdominal pain.  She is at her baseline.  Overall family frustrated because she is constantly trying to go to the bathroom but not sure if this is something more behavioral at this point.  Urinalysis per my review and interpretation shows no evidence of infection.  Upon reevaluation patient was having some emesis.  Decided with family to get a CBC, CMP, lipase, CT scan of the abdomen pelvis to look for any other type of process including obstructive  process or infectious process.  Per my review and interpretation of labs there is no significant anemia, electrolyte abnormality, kidney injury, leukocytosis.  Lipase is normal.  CT with no acute findings. Patient discharge, fu with PCP, urology.  This chart was dictated using voice recognition software.  Despite best efforts to proofread,  errors can occur which can change the documentation meaning.         Final Clinical Impression(s) / ED Diagnoses  Final diagnoses:  Urinary frequency    Rx / DC Orders ED Discharge Orders          Ordered    ondansetron (ZOFRAN) 4 MG tablet  Every 6 hours        05/18/22 2332              Lennice Sites, DO 05/18/22 2348

## 2022-05-18 NOTE — Discharge Instructions (Addendum)
Use Zofran as needed for nausea and vomiting.  Blood work and imaging today is unremarkable.  Follow-up with primary care doctor and urology.

## 2022-05-19 NOTE — ED Notes (Signed)
Reviewed AVS/discharge instruction with patient/daughter Time allotted for and all questions answered. Patient and family  is agreeable for d/c and escorted to ed exit by staff.

## 2022-05-20 LAB — URINE CULTURE: Culture: NO GROWTH

## 2022-05-21 ENCOUNTER — Telehealth: Payer: Self-pay

## 2022-05-21 ENCOUNTER — Telehealth: Payer: Self-pay | Admitting: Internal Medicine

## 2022-05-21 DIAGNOSIS — N898 Other specified noninflammatory disorders of vagina: Secondary | ICD-10-CM

## 2022-05-21 NOTE — Telephone Encounter (Signed)
Patient's daughter called, she took mother to urgent care for vaginal pain. Urgent Care did a ultrasound and everything looked ok. Patient is still having the pain and Daughter wanted to speak to Dr Nicki Reaper.

## 2022-05-21 NOTE — Addendum Note (Signed)
Addended by: Alisa Graff on: 05/21/2022 10:23 PM   Modules accepted: Orders

## 2022-05-21 NOTE — Telephone Encounter (Signed)
Manus Gunning called from [facility] to state they received a referral for patient, but patient is out of their service area.  Manus Gunning asked that we please call her.

## 2022-05-21 NOTE — Telephone Encounter (Signed)
[name] advised we are ok to refer to gyn - pt prefers female. Who are your preferences ? [facility] ?  [name] also asking if we can send over rx for Tramadol for pt , only has 2 1/2 pills left.

## 2022-05-21 NOTE — Telephone Encounter (Signed)
I am ok to refer to gyn for further evaluation since the things tried are not helping.

## 2022-05-21 NOTE — Telephone Encounter (Signed)
I placed order for referral to gyn.  Someone should be contacting her with appt date and time.  Also, I can send in refill for tramadol, but need to know which pharmacy she desires.  The one [facility] is [facility] [address].  Was not sure if needed in [place] since where living now.  Need to clarify. Also, have they been contacted by hospice.

## 2022-05-22 MED ORDER — TRAMADOL HCL 50 MG PO TABS: ORAL_TABLET | ORAL | 0 refills | Status: DC

## 2022-05-22 NOTE — Telephone Encounter (Signed)
Thank you Rasheedah !!

## 2022-05-22 NOTE — Telephone Encounter (Signed)
fyi

## 2022-05-22 NOTE — Telephone Encounter (Signed)
Referral ofc notes ins card was sent to [facility] on 05/15/2022. I'm not sure why [facility] received a referral I spoke to someone in [place]. I will call to follow up.

## 2022-05-22 NOTE — Addendum Note (Signed)
Addended by: Alisa Graff on: 05/22/2022 02:19 PM   Modules accepted: Orders

## 2022-05-22 NOTE — Telephone Encounter (Signed)
Manus Gunning from the [facility] called and stated they merged with [facility] and they do service [place] the area. Pt is scheduled to have someone come out next week. Pt daughter is aware.

## 2022-05-22 NOTE — Telephone Encounter (Signed)
Manus Gunning from the [facility] called and stated they merged with [facility] and they do service [place] the area. Pt is scheduled to have someone come out next week. Pt daughter is aware.

## 2022-05-22 NOTE — Telephone Encounter (Signed)
Rx for tramadol sent in to pharmacy.  Awaiting hospice assessment.

## 2022-05-22 NOTE — Telephone Encounter (Signed)
Referral ofc notes ins card was sent to [facility] on 05/15/2022. I'm not sure why [facility] received a referral I spoke to someone in [place]. I will call to follow up.

## 2022-05-22 NOTE — Telephone Encounter (Signed)
[facility] in [place] per [facility] - chart updated. Did receive notice yesterday from [facility] in [place] that pt is out of their service area?  I did see noted on referral that you had put needed [place]. I did send over note to [facility] to see if there was something we needed to do to fix this, have not received response yet.

## 2022-05-23 ENCOUNTER — Ambulatory Visit
Admission: RE | Admit: 2022-05-23 | Discharge: 2022-05-23 | Disposition: A | Discharge status: Home / Self Care | Payer: Medicare Other | Source: Ambulatory Visit | Attending: Urology | Admitting: Urology

## 2022-05-23 DIAGNOSIS — N390 Urinary tract infection, site not specified: Secondary | ICD-10-CM | POA: Diagnosis not present

## 2022-05-23 DIAGNOSIS — N281 Cyst of kidney, acquired: Secondary | ICD-10-CM | POA: Diagnosis not present

## 2022-05-24 ENCOUNTER — Inpatient Hospital Stay: Payer: Medicare Other | Admitting: Internal Medicine

## 2022-05-29 DIAGNOSIS — G311 Senile degeneration of brain, not elsewhere classified: Secondary | ICD-10-CM | POA: Diagnosis not present

## 2022-05-29 DIAGNOSIS — Z87440 Personal history of urinary (tract) infections: Secondary | ICD-10-CM | POA: Diagnosis not present

## 2022-05-29 DIAGNOSIS — M1990 Unspecified osteoarthritis, unspecified site: Secondary | ICD-10-CM | POA: Diagnosis not present

## 2022-05-29 DIAGNOSIS — K2100 Gastro-esophageal reflux disease with esophagitis, without bleeding: Secondary | ICD-10-CM | POA: Diagnosis not present

## 2022-05-29 DIAGNOSIS — F0280 Dementia in other diseases classified elsewhere without behavioral disturbance: Secondary | ICD-10-CM | POA: Diagnosis not present

## 2022-05-29 DIAGNOSIS — R634 Abnormal weight loss: Secondary | ICD-10-CM | POA: Diagnosis not present

## 2022-05-29 DIAGNOSIS — M810 Age-related osteoporosis without current pathological fracture: Secondary | ICD-10-CM | POA: Diagnosis not present

## 2022-05-29 DIAGNOSIS — Z85828 Personal history of other malignant neoplasm of skin: Secondary | ICD-10-CM | POA: Diagnosis not present

## 2022-05-29 DIAGNOSIS — R1310 Dysphagia, unspecified: Secondary | ICD-10-CM | POA: Diagnosis not present

## 2022-05-29 DIAGNOSIS — D519 Vitamin B12 deficiency anemia, unspecified: Secondary | ICD-10-CM | POA: Diagnosis not present

## 2022-05-29 DIAGNOSIS — F419 Anxiety disorder, unspecified: Secondary | ICD-10-CM | POA: Diagnosis not present

## 2022-05-30 ENCOUNTER — Ambulatory Visit: Payer: Medicare Other | Admitting: Urology

## 2022-05-30 DIAGNOSIS — Z87440 Personal history of urinary (tract) infections: Secondary | ICD-10-CM | POA: Diagnosis not present

## 2022-05-30 DIAGNOSIS — K2100 Gastro-esophageal reflux disease with esophagitis, without bleeding: Secondary | ICD-10-CM | POA: Diagnosis not present

## 2022-05-30 DIAGNOSIS — F0280 Dementia in other diseases classified elsewhere without behavioral disturbance: Secondary | ICD-10-CM | POA: Diagnosis not present

## 2022-05-30 DIAGNOSIS — R634 Abnormal weight loss: Secondary | ICD-10-CM | POA: Diagnosis not present

## 2022-05-30 DIAGNOSIS — R1310 Dysphagia, unspecified: Secondary | ICD-10-CM | POA: Diagnosis not present

## 2022-05-30 DIAGNOSIS — G311 Senile degeneration of brain, not elsewhere classified: Secondary | ICD-10-CM | POA: Diagnosis not present

## 2022-05-31 DIAGNOSIS — K2100 Gastro-esophageal reflux disease with esophagitis, without bleeding: Secondary | ICD-10-CM | POA: Diagnosis not present

## 2022-05-31 DIAGNOSIS — Z87440 Personal history of urinary (tract) infections: Secondary | ICD-10-CM | POA: Diagnosis not present

## 2022-05-31 DIAGNOSIS — R1310 Dysphagia, unspecified: Secondary | ICD-10-CM | POA: Diagnosis not present

## 2022-05-31 DIAGNOSIS — G311 Senile degeneration of brain, not elsewhere classified: Secondary | ICD-10-CM | POA: Diagnosis not present

## 2022-05-31 DIAGNOSIS — F0280 Dementia in other diseases classified elsewhere without behavioral disturbance: Secondary | ICD-10-CM | POA: Diagnosis not present

## 2022-05-31 DIAGNOSIS — R634 Abnormal weight loss: Secondary | ICD-10-CM | POA: Diagnosis not present

## 2022-06-03 DIAGNOSIS — R634 Abnormal weight loss: Secondary | ICD-10-CM | POA: Diagnosis not present

## 2022-06-03 DIAGNOSIS — R1310 Dysphagia, unspecified: Secondary | ICD-10-CM | POA: Diagnosis not present

## 2022-06-03 DIAGNOSIS — F0280 Dementia in other diseases classified elsewhere without behavioral disturbance: Secondary | ICD-10-CM | POA: Diagnosis not present

## 2022-06-03 DIAGNOSIS — K2100 Gastro-esophageal reflux disease with esophagitis, without bleeding: Secondary | ICD-10-CM | POA: Diagnosis not present

## 2022-06-03 DIAGNOSIS — G311 Senile degeneration of brain, not elsewhere classified: Secondary | ICD-10-CM | POA: Diagnosis not present

## 2022-06-03 DIAGNOSIS — Z87440 Personal history of urinary (tract) infections: Secondary | ICD-10-CM | POA: Diagnosis not present

## 2022-06-04 DIAGNOSIS — Z85828 Personal history of other malignant neoplasm of skin: Secondary | ICD-10-CM | POA: Diagnosis not present

## 2022-06-04 DIAGNOSIS — R1310 Dysphagia, unspecified: Secondary | ICD-10-CM | POA: Diagnosis not present

## 2022-06-04 DIAGNOSIS — R634 Abnormal weight loss: Secondary | ICD-10-CM | POA: Diagnosis not present

## 2022-06-04 DIAGNOSIS — F419 Anxiety disorder, unspecified: Secondary | ICD-10-CM | POA: Diagnosis not present

## 2022-06-04 DIAGNOSIS — K2100 Gastro-esophageal reflux disease with esophagitis, without bleeding: Secondary | ICD-10-CM | POA: Diagnosis not present

## 2022-06-04 DIAGNOSIS — G311 Senile degeneration of brain, not elsewhere classified: Secondary | ICD-10-CM | POA: Diagnosis not present

## 2022-06-04 DIAGNOSIS — Z87440 Personal history of urinary (tract) infections: Secondary | ICD-10-CM | POA: Diagnosis not present

## 2022-06-04 DIAGNOSIS — D519 Vitamin B12 deficiency anemia, unspecified: Secondary | ICD-10-CM | POA: Diagnosis not present

## 2022-06-04 DIAGNOSIS — F0280 Dementia in other diseases classified elsewhere without behavioral disturbance: Secondary | ICD-10-CM | POA: Diagnosis not present

## 2022-06-04 DIAGNOSIS — M1990 Unspecified osteoarthritis, unspecified site: Secondary | ICD-10-CM | POA: Diagnosis not present

## 2022-06-04 DIAGNOSIS — M810 Age-related osteoporosis without current pathological fracture: Secondary | ICD-10-CM | POA: Diagnosis not present

## 2022-06-05 DIAGNOSIS — G311 Senile degeneration of brain, not elsewhere classified: Secondary | ICD-10-CM | POA: Diagnosis not present

## 2022-06-05 DIAGNOSIS — R1310 Dysphagia, unspecified: Secondary | ICD-10-CM | POA: Diagnosis not present

## 2022-06-05 DIAGNOSIS — R634 Abnormal weight loss: Secondary | ICD-10-CM | POA: Diagnosis not present

## 2022-06-05 DIAGNOSIS — F0280 Dementia in other diseases classified elsewhere without behavioral disturbance: Secondary | ICD-10-CM | POA: Diagnosis not present

## 2022-06-05 DIAGNOSIS — K2100 Gastro-esophageal reflux disease with esophagitis, without bleeding: Secondary | ICD-10-CM | POA: Diagnosis not present

## 2022-06-05 DIAGNOSIS — Z87440 Personal history of urinary (tract) infections: Secondary | ICD-10-CM | POA: Diagnosis not present

## 2022-06-06 DIAGNOSIS — Z87440 Personal history of urinary (tract) infections: Secondary | ICD-10-CM | POA: Diagnosis not present

## 2022-06-06 DIAGNOSIS — F0280 Dementia in other diseases classified elsewhere without behavioral disturbance: Secondary | ICD-10-CM | POA: Diagnosis not present

## 2022-06-06 DIAGNOSIS — R1310 Dysphagia, unspecified: Secondary | ICD-10-CM | POA: Diagnosis not present

## 2022-06-06 DIAGNOSIS — G311 Senile degeneration of brain, not elsewhere classified: Secondary | ICD-10-CM | POA: Diagnosis not present

## 2022-06-06 DIAGNOSIS — R634 Abnormal weight loss: Secondary | ICD-10-CM | POA: Diagnosis not present

## 2022-06-06 DIAGNOSIS — K2100 Gastro-esophageal reflux disease with esophagitis, without bleeding: Secondary | ICD-10-CM | POA: Diagnosis not present

## 2022-06-07 ENCOUNTER — Telehealth: Payer: Self-pay | Admitting: Internal Medicine

## 2022-06-07 NOTE — Telephone Encounter (Signed)
Patient's daughter called, [name]. She wanted to ask Dr Nicki Reaper if her mother should continue her B12 injections, if so how will she have this done?

## 2022-06-08 DIAGNOSIS — R1310 Dysphagia, unspecified: Secondary | ICD-10-CM | POA: Diagnosis not present

## 2022-06-08 DIAGNOSIS — F0280 Dementia in other diseases classified elsewhere without behavioral disturbance: Secondary | ICD-10-CM | POA: Diagnosis not present

## 2022-06-08 DIAGNOSIS — K2100 Gastro-esophageal reflux disease with esophagitis, without bleeding: Secondary | ICD-10-CM | POA: Diagnosis not present

## 2022-06-08 DIAGNOSIS — Z87440 Personal history of urinary (tract) infections: Secondary | ICD-10-CM | POA: Diagnosis not present

## 2022-06-08 DIAGNOSIS — G311 Senile degeneration of brain, not elsewhere classified: Secondary | ICD-10-CM | POA: Diagnosis not present

## 2022-06-08 DIAGNOSIS — R634 Abnormal weight loss: Secondary | ICD-10-CM | POA: Diagnosis not present

## 2022-06-11 DIAGNOSIS — R1310 Dysphagia, unspecified: Secondary | ICD-10-CM | POA: Diagnosis not present

## 2022-06-11 DIAGNOSIS — F0280 Dementia in other diseases classified elsewhere without behavioral disturbance: Secondary | ICD-10-CM | POA: Diagnosis not present

## 2022-06-11 DIAGNOSIS — R634 Abnormal weight loss: Secondary | ICD-10-CM | POA: Diagnosis not present

## 2022-06-11 DIAGNOSIS — G311 Senile degeneration of brain, not elsewhere classified: Secondary | ICD-10-CM | POA: Diagnosis not present

## 2022-06-11 DIAGNOSIS — K2100 Gastro-esophageal reflux disease with esophagitis, without bleeding: Secondary | ICD-10-CM | POA: Diagnosis not present

## 2022-06-11 DIAGNOSIS — Z87440 Personal history of urinary (tract) infections: Secondary | ICD-10-CM | POA: Diagnosis not present

## 2022-06-12 ENCOUNTER — Other Ambulatory Visit: Payer: Self-pay

## 2022-06-12 DIAGNOSIS — R634 Abnormal weight loss: Secondary | ICD-10-CM | POA: Diagnosis not present

## 2022-06-12 DIAGNOSIS — R1310 Dysphagia, unspecified: Secondary | ICD-10-CM | POA: Diagnosis not present

## 2022-06-12 DIAGNOSIS — F0280 Dementia in other diseases classified elsewhere without behavioral disturbance: Secondary | ICD-10-CM | POA: Diagnosis not present

## 2022-06-12 DIAGNOSIS — Z87440 Personal history of urinary (tract) infections: Secondary | ICD-10-CM | POA: Diagnosis not present

## 2022-06-12 DIAGNOSIS — K2100 Gastro-esophageal reflux disease with esophagitis, without bleeding: Secondary | ICD-10-CM | POA: Diagnosis not present

## 2022-06-12 DIAGNOSIS — E538 Deficiency of other specified B group vitamins: Secondary | ICD-10-CM

## 2022-06-12 DIAGNOSIS — G311 Senile degeneration of brain, not elsewhere classified: Secondary | ICD-10-CM | POA: Diagnosis not present

## 2022-06-12 MED ORDER — CYANOCOBALAMIN 1000 MCG/ML IJ SOLN: 1000.0000 ug | INTRAMUSCULAR | 0 refills | Status: DC

## 2022-06-12 NOTE — Telephone Encounter (Signed)
S/w [name] -  Wanted to know if it was necessary to continue to have pt come in for b12 injections. Or if you are agreeable to let hospice do b12?  [name] stated hospice nurse can give injection,  just need rx sent to pharmacy for medication.  [name] also wanted to give you an update on pt :  Stated hospice has taken her off almost all of her medications. Stated she is not taking any of her supplements/vitamins, and shes been taken off her cholesterol medication and the tramadol.  Pt is taking protonix still, and hospice has added Morphine prn, and haloperidol at night.   [name] stated that since starting haloperidol, pt seems to be down. [name] stated pt doesn't have much want for food, but will eat and is eating.  She doesn't want to get up and walk much. Has to be coaxed to do a lot of things now. She is more quiet.

## 2022-06-12 NOTE — Telephone Encounter (Signed)
[place] for hospice to given B12 injections.  Also, would recommend making hospice aware of change since starting haldol.

## 2022-06-12 NOTE — Telephone Encounter (Signed)
S/w Sabrina Cervantes -  Wanted to know if it was necessary to continue to have pt come in for b12 injections. Or if hospice can do b12?

## 2022-06-12 NOTE — Telephone Encounter (Signed)
Rx sent Sabrina Cervantes had stated hospice nurse was coming today and she was going to speak with her regarding change after starting haloperidol.

## 2022-06-13 DIAGNOSIS — Z87440 Personal history of urinary (tract) infections: Secondary | ICD-10-CM | POA: Diagnosis not present

## 2022-06-13 DIAGNOSIS — R634 Abnormal weight loss: Secondary | ICD-10-CM | POA: Diagnosis not present

## 2022-06-13 DIAGNOSIS — R1310 Dysphagia, unspecified: Secondary | ICD-10-CM | POA: Diagnosis not present

## 2022-06-13 DIAGNOSIS — F0280 Dementia in other diseases classified elsewhere without behavioral disturbance: Secondary | ICD-10-CM | POA: Diagnosis not present

## 2022-06-13 DIAGNOSIS — G311 Senile degeneration of brain, not elsewhere classified: Secondary | ICD-10-CM | POA: Diagnosis not present

## 2022-06-13 DIAGNOSIS — K2100 Gastro-esophageal reflux disease with esophagitis, without bleeding: Secondary | ICD-10-CM | POA: Diagnosis not present

## 2022-06-14 DIAGNOSIS — G311 Senile degeneration of brain, not elsewhere classified: Secondary | ICD-10-CM | POA: Diagnosis not present

## 2022-06-14 DIAGNOSIS — Z87440 Personal history of urinary (tract) infections: Secondary | ICD-10-CM | POA: Diagnosis not present

## 2022-06-14 DIAGNOSIS — R634 Abnormal weight loss: Secondary | ICD-10-CM | POA: Diagnosis not present

## 2022-06-14 DIAGNOSIS — R1310 Dysphagia, unspecified: Secondary | ICD-10-CM | POA: Diagnosis not present

## 2022-06-14 DIAGNOSIS — K2100 Gastro-esophageal reflux disease with esophagitis, without bleeding: Secondary | ICD-10-CM | POA: Diagnosis not present

## 2022-06-14 DIAGNOSIS — F0280 Dementia in other diseases classified elsewhere without behavioral disturbance: Secondary | ICD-10-CM | POA: Diagnosis not present

## 2022-06-18 DIAGNOSIS — Z87440 Personal history of urinary (tract) infections: Secondary | ICD-10-CM | POA: Diagnosis not present

## 2022-06-18 DIAGNOSIS — R1310 Dysphagia, unspecified: Secondary | ICD-10-CM | POA: Diagnosis not present

## 2022-06-18 DIAGNOSIS — G311 Senile degeneration of brain, not elsewhere classified: Secondary | ICD-10-CM | POA: Diagnosis not present

## 2022-06-18 DIAGNOSIS — R634 Abnormal weight loss: Secondary | ICD-10-CM | POA: Diagnosis not present

## 2022-06-18 DIAGNOSIS — K2100 Gastro-esophageal reflux disease with esophagitis, without bleeding: Secondary | ICD-10-CM | POA: Diagnosis not present

## 2022-06-18 DIAGNOSIS — F0280 Dementia in other diseases classified elsewhere without behavioral disturbance: Secondary | ICD-10-CM | POA: Diagnosis not present

## 2022-06-19 DIAGNOSIS — K2100 Gastro-esophageal reflux disease with esophagitis, without bleeding: Secondary | ICD-10-CM | POA: Diagnosis not present

## 2022-06-19 DIAGNOSIS — Z87440 Personal history of urinary (tract) infections: Secondary | ICD-10-CM | POA: Diagnosis not present

## 2022-06-19 DIAGNOSIS — F0280 Dementia in other diseases classified elsewhere without behavioral disturbance: Secondary | ICD-10-CM | POA: Diagnosis not present

## 2022-06-19 DIAGNOSIS — R634 Abnormal weight loss: Secondary | ICD-10-CM | POA: Diagnosis not present

## 2022-06-19 DIAGNOSIS — G311 Senile degeneration of brain, not elsewhere classified: Secondary | ICD-10-CM | POA: Diagnosis not present

## 2022-06-19 DIAGNOSIS — R1310 Dysphagia, unspecified: Secondary | ICD-10-CM | POA: Diagnosis not present

## 2022-06-20 DIAGNOSIS — G311 Senile degeneration of brain, not elsewhere classified: Secondary | ICD-10-CM | POA: Diagnosis not present

## 2022-06-20 DIAGNOSIS — R1310 Dysphagia, unspecified: Secondary | ICD-10-CM | POA: Diagnosis not present

## 2022-06-20 DIAGNOSIS — K2100 Gastro-esophageal reflux disease with esophagitis, without bleeding: Secondary | ICD-10-CM | POA: Diagnosis not present

## 2022-06-20 DIAGNOSIS — R634 Abnormal weight loss: Secondary | ICD-10-CM | POA: Diagnosis not present

## 2022-06-20 DIAGNOSIS — Z87440 Personal history of urinary (tract) infections: Secondary | ICD-10-CM | POA: Diagnosis not present

## 2022-06-20 DIAGNOSIS — F0280 Dementia in other diseases classified elsewhere without behavioral disturbance: Secondary | ICD-10-CM | POA: Diagnosis not present

## 2022-06-24 DIAGNOSIS — K2100 Gastro-esophageal reflux disease with esophagitis, without bleeding: Secondary | ICD-10-CM | POA: Diagnosis not present

## 2022-06-24 DIAGNOSIS — R634 Abnormal weight loss: Secondary | ICD-10-CM | POA: Diagnosis not present

## 2022-06-24 DIAGNOSIS — Z87440 Personal history of urinary (tract) infections: Secondary | ICD-10-CM | POA: Diagnosis not present

## 2022-06-24 DIAGNOSIS — F0280 Dementia in other diseases classified elsewhere without behavioral disturbance: Secondary | ICD-10-CM | POA: Diagnosis not present

## 2022-06-24 DIAGNOSIS — G311 Senile degeneration of brain, not elsewhere classified: Secondary | ICD-10-CM | POA: Diagnosis not present

## 2022-06-24 DIAGNOSIS — R1310 Dysphagia, unspecified: Secondary | ICD-10-CM | POA: Diagnosis not present

## 2022-06-25 DIAGNOSIS — R634 Abnormal weight loss: Secondary | ICD-10-CM | POA: Diagnosis not present

## 2022-06-25 DIAGNOSIS — R1310 Dysphagia, unspecified: Secondary | ICD-10-CM | POA: Diagnosis not present

## 2022-06-25 DIAGNOSIS — G311 Senile degeneration of brain, not elsewhere classified: Secondary | ICD-10-CM | POA: Diagnosis not present

## 2022-06-25 DIAGNOSIS — F0280 Dementia in other diseases classified elsewhere without behavioral disturbance: Secondary | ICD-10-CM | POA: Diagnosis not present

## 2022-06-25 DIAGNOSIS — K2100 Gastro-esophageal reflux disease with esophagitis, without bleeding: Secondary | ICD-10-CM | POA: Diagnosis not present

## 2022-06-25 DIAGNOSIS — Z87440 Personal history of urinary (tract) infections: Secondary | ICD-10-CM | POA: Diagnosis not present

## 2022-06-27 DIAGNOSIS — F0280 Dementia in other diseases classified elsewhere without behavioral disturbance: Secondary | ICD-10-CM | POA: Diagnosis not present

## 2022-06-27 DIAGNOSIS — G311 Senile degeneration of brain, not elsewhere classified: Secondary | ICD-10-CM | POA: Diagnosis not present

## 2022-06-27 DIAGNOSIS — K2100 Gastro-esophageal reflux disease with esophagitis, without bleeding: Secondary | ICD-10-CM | POA: Diagnosis not present

## 2022-06-27 DIAGNOSIS — R634 Abnormal weight loss: Secondary | ICD-10-CM | POA: Diagnosis not present

## 2022-06-27 DIAGNOSIS — Z87440 Personal history of urinary (tract) infections: Secondary | ICD-10-CM | POA: Diagnosis not present

## 2022-06-27 DIAGNOSIS — R1310 Dysphagia, unspecified: Secondary | ICD-10-CM | POA: Diagnosis not present

## 2022-06-28 DIAGNOSIS — G311 Senile degeneration of brain, not elsewhere classified: Secondary | ICD-10-CM | POA: Diagnosis not present

## 2022-06-28 DIAGNOSIS — R634 Abnormal weight loss: Secondary | ICD-10-CM | POA: Diagnosis not present

## 2022-06-28 DIAGNOSIS — K2100 Gastro-esophageal reflux disease with esophagitis, without bleeding: Secondary | ICD-10-CM | POA: Diagnosis not present

## 2022-06-28 DIAGNOSIS — Z87440 Personal history of urinary (tract) infections: Secondary | ICD-10-CM | POA: Diagnosis not present

## 2022-06-28 DIAGNOSIS — R1310 Dysphagia, unspecified: Secondary | ICD-10-CM | POA: Diagnosis not present

## 2022-06-28 DIAGNOSIS — F0280 Dementia in other diseases classified elsewhere without behavioral disturbance: Secondary | ICD-10-CM | POA: Diagnosis not present

## 2022-07-02 DIAGNOSIS — R1310 Dysphagia, unspecified: Secondary | ICD-10-CM | POA: Diagnosis not present

## 2022-07-02 DIAGNOSIS — F0280 Dementia in other diseases classified elsewhere without behavioral disturbance: Secondary | ICD-10-CM | POA: Diagnosis not present

## 2022-07-02 DIAGNOSIS — Z87440 Personal history of urinary (tract) infections: Secondary | ICD-10-CM | POA: Diagnosis not present

## 2022-07-02 DIAGNOSIS — K2100 Gastro-esophageal reflux disease with esophagitis, without bleeding: Secondary | ICD-10-CM | POA: Diagnosis not present

## 2022-07-02 DIAGNOSIS — R634 Abnormal weight loss: Secondary | ICD-10-CM | POA: Diagnosis not present

## 2022-07-02 DIAGNOSIS — G311 Senile degeneration of brain, not elsewhere classified: Secondary | ICD-10-CM | POA: Diagnosis not present

## 2022-07-03 DIAGNOSIS — R1310 Dysphagia, unspecified: Secondary | ICD-10-CM | POA: Diagnosis not present

## 2022-07-03 DIAGNOSIS — F0280 Dementia in other diseases classified elsewhere without behavioral disturbance: Secondary | ICD-10-CM | POA: Diagnosis not present

## 2022-07-03 DIAGNOSIS — R634 Abnormal weight loss: Secondary | ICD-10-CM | POA: Diagnosis not present

## 2022-07-03 DIAGNOSIS — G311 Senile degeneration of brain, not elsewhere classified: Secondary | ICD-10-CM | POA: Diagnosis not present

## 2022-07-03 DIAGNOSIS — K2100 Gastro-esophageal reflux disease with esophagitis, without bleeding: Secondary | ICD-10-CM | POA: Diagnosis not present

## 2022-07-03 DIAGNOSIS — Z87440 Personal history of urinary (tract) infections: Secondary | ICD-10-CM | POA: Diagnosis not present

## 2022-07-04 ENCOUNTER — Ambulatory Visit: Payer: Medicare Other | Admitting: Internal Medicine

## 2022-07-04 DIAGNOSIS — R1310 Dysphagia, unspecified: Secondary | ICD-10-CM | POA: Diagnosis not present

## 2022-07-04 DIAGNOSIS — G311 Senile degeneration of brain, not elsewhere classified: Secondary | ICD-10-CM | POA: Diagnosis not present

## 2022-07-04 DIAGNOSIS — K2100 Gastro-esophageal reflux disease with esophagitis, without bleeding: Secondary | ICD-10-CM | POA: Diagnosis not present

## 2022-07-04 DIAGNOSIS — Z87440 Personal history of urinary (tract) infections: Secondary | ICD-10-CM | POA: Diagnosis not present

## 2022-07-04 DIAGNOSIS — F0280 Dementia in other diseases classified elsewhere without behavioral disturbance: Secondary | ICD-10-CM | POA: Diagnosis not present

## 2022-07-04 DIAGNOSIS — R634 Abnormal weight loss: Secondary | ICD-10-CM | POA: Diagnosis not present

## 2022-07-05 DIAGNOSIS — M810 Age-related osteoporosis without current pathological fracture: Secondary | ICD-10-CM | POA: Diagnosis not present

## 2022-07-05 DIAGNOSIS — Z87440 Personal history of urinary (tract) infections: Secondary | ICD-10-CM | POA: Diagnosis not present

## 2022-07-05 DIAGNOSIS — K2100 Gastro-esophageal reflux disease with esophagitis, without bleeding: Secondary | ICD-10-CM | POA: Diagnosis not present

## 2022-07-05 DIAGNOSIS — R634 Abnormal weight loss: Secondary | ICD-10-CM | POA: Diagnosis not present

## 2022-07-05 DIAGNOSIS — G311 Senile degeneration of brain, not elsewhere classified: Secondary | ICD-10-CM | POA: Diagnosis not present

## 2022-07-05 DIAGNOSIS — Z85828 Personal history of other malignant neoplasm of skin: Secondary | ICD-10-CM | POA: Diagnosis not present

## 2022-07-05 DIAGNOSIS — F0280 Dementia in other diseases classified elsewhere without behavioral disturbance: Secondary | ICD-10-CM | POA: Diagnosis not present

## 2022-07-05 DIAGNOSIS — D519 Vitamin B12 deficiency anemia, unspecified: Secondary | ICD-10-CM | POA: Diagnosis not present

## 2022-07-05 DIAGNOSIS — M1990 Unspecified osteoarthritis, unspecified site: Secondary | ICD-10-CM | POA: Diagnosis not present

## 2022-07-05 DIAGNOSIS — F419 Anxiety disorder, unspecified: Secondary | ICD-10-CM | POA: Diagnosis not present

## 2022-07-05 DIAGNOSIS — R1310 Dysphagia, unspecified: Secondary | ICD-10-CM | POA: Diagnosis not present

## 2022-07-09 DIAGNOSIS — R1310 Dysphagia, unspecified: Secondary | ICD-10-CM | POA: Diagnosis not present

## 2022-07-09 DIAGNOSIS — K2100 Gastro-esophageal reflux disease with esophagitis, without bleeding: Secondary | ICD-10-CM | POA: Diagnosis not present

## 2022-07-09 DIAGNOSIS — G311 Senile degeneration of brain, not elsewhere classified: Secondary | ICD-10-CM | POA: Diagnosis not present

## 2022-07-09 DIAGNOSIS — Z87440 Personal history of urinary (tract) infections: Secondary | ICD-10-CM | POA: Diagnosis not present

## 2022-07-09 DIAGNOSIS — R634 Abnormal weight loss: Secondary | ICD-10-CM | POA: Diagnosis not present

## 2022-07-09 DIAGNOSIS — F0280 Dementia in other diseases classified elsewhere without behavioral disturbance: Secondary | ICD-10-CM | POA: Diagnosis not present

## 2022-07-10 DIAGNOSIS — G311 Senile degeneration of brain, not elsewhere classified: Secondary | ICD-10-CM | POA: Diagnosis not present

## 2022-07-10 DIAGNOSIS — K2100 Gastro-esophageal reflux disease with esophagitis, without bleeding: Secondary | ICD-10-CM | POA: Diagnosis not present

## 2022-07-10 DIAGNOSIS — R634 Abnormal weight loss: Secondary | ICD-10-CM | POA: Diagnosis not present

## 2022-07-10 DIAGNOSIS — F0280 Dementia in other diseases classified elsewhere without behavioral disturbance: Secondary | ICD-10-CM | POA: Diagnosis not present

## 2022-07-10 DIAGNOSIS — R1310 Dysphagia, unspecified: Secondary | ICD-10-CM | POA: Diagnosis not present

## 2022-07-10 DIAGNOSIS — Z87440 Personal history of urinary (tract) infections: Secondary | ICD-10-CM | POA: Diagnosis not present

## 2022-08-04 DEATH — deceased
